# Patient Record
Sex: Male | Born: 1955 | Race: White | Hispanic: No | Marital: Married | Smoking: Former smoker
Health system: Southern US, Community
[De-identification: ages and names within clinical notes are randomized; demographics above are authoritative.]

## PROBLEM LIST (undated history)

## (undated) DIAGNOSIS — C4491 Basal cell carcinoma of skin, unspecified: Secondary | ICD-10-CM

## (undated) DIAGNOSIS — E7800 Pure hypercholesterolemia, unspecified: Secondary | ICD-10-CM

## (undated) DIAGNOSIS — I255 Ischemic cardiomyopathy: Secondary | ICD-10-CM

## (undated) DIAGNOSIS — I2510 Atherosclerotic heart disease of native coronary artery without angina pectoris: Secondary | ICD-10-CM

## (undated) DIAGNOSIS — I10 Essential (primary) hypertension: Secondary | ICD-10-CM

## (undated) HISTORY — PX: APPENDECTOMY: SHX54

---

## 1986-02-23 HISTORY — PX: NASAL SEPTUM SURGERY: SHX37

## 2003-02-24 HISTORY — PX: CORONARY ANGIOPLASTY WITH STENT PLACEMENT: SHX49

## 2003-02-24 HISTORY — PX: OTHER SURGICAL HISTORY: SHX169

## 2003-12-18 ENCOUNTER — Inpatient Hospital Stay (HOSPITAL_COMMUNITY): Admission: EM | Admit: 2003-12-18 | Discharge: 2003-12-28 | Payer: Self-pay | Admitting: Cardiology

## 2003-12-18 ENCOUNTER — Ambulatory Visit: Payer: Self-pay | Admitting: Cardiology

## 2003-12-20 ENCOUNTER — Encounter: Payer: Self-pay | Admitting: Cardiology

## 2004-02-14 ENCOUNTER — Ambulatory Visit: Payer: Self-pay | Admitting: Internal Medicine

## 2004-02-19 ENCOUNTER — Ambulatory Visit: Payer: Self-pay | Admitting: Internal Medicine

## 2004-02-19 ENCOUNTER — Observation Stay (HOSPITAL_COMMUNITY): Admission: RE | Admit: 2004-02-19 | Discharge: 2004-02-20 | Payer: Self-pay | Admitting: Internal Medicine

## 2004-03-05 ENCOUNTER — Ambulatory Visit: Payer: Self-pay | Admitting: Internal Medicine

## 2004-03-24 ENCOUNTER — Ambulatory Visit: Payer: Self-pay | Admitting: Internal Medicine

## 2004-12-30 ENCOUNTER — Ambulatory Visit: Payer: Self-pay | Admitting: Internal Medicine

## 2005-07-13 ENCOUNTER — Ambulatory Visit: Payer: Self-pay | Admitting: Internal Medicine

## 2005-08-09 IMAGING — CT CT ABDOMEN W/O CM
1 series · 15 of 32 positions shown, 19 images · IV contrast (agent unspecified)
Comparison: none

CLINICAL DATA: Chest pain, cardiac cath, abdominal and pelvic pain with history of   cath earlier this week.
TECHNIQUE: Multidetector helical CT scanning obtained through the abdomen and pelvis.
CT ABDOMEN WITHOUT CONTRAST:
The liver, spleen, adrenal glands, pancreas are unremarkable.  Mild perinephric stranding bilaterally may be related to acute or remote inflammation.  A tiny calcification along the gallbladder wall may represent a tiny gallstone.  The remainder of the gallbladder and kidneys are unremarkable.  Please note that parenchymal abnormalities may be missed as intravenous contrast was not administered.  No evidence of enlarged lymph nodes, free fluid, abdominal aortic aneurysm, biliary dilatation, or hematoma.  Visualized bowel is unremarkable.  Moderate umbilical hernia containing fat is noted.

[Series 2: renal stone · axial · 0.70mm/px · z∈[-445,-50]mm · 15 of 88 slices shown, 19 images]
[im 6/88  soft-tissue]
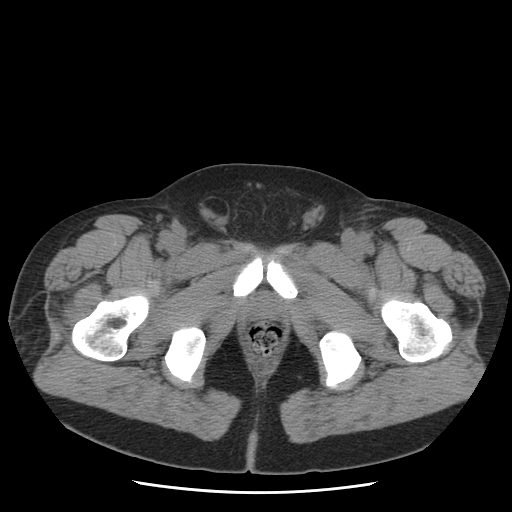
[im 6/88  bone]
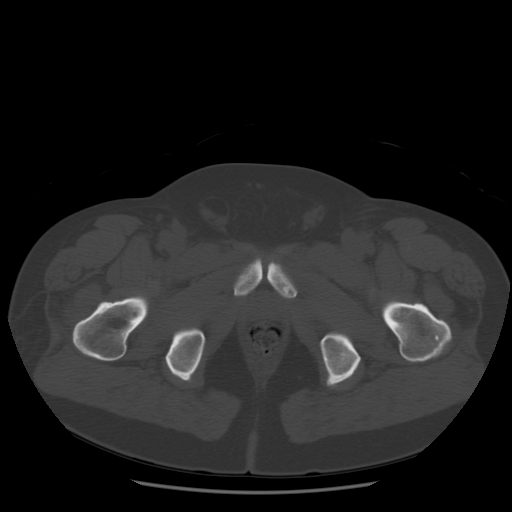
[im 12/88  soft-tissue]
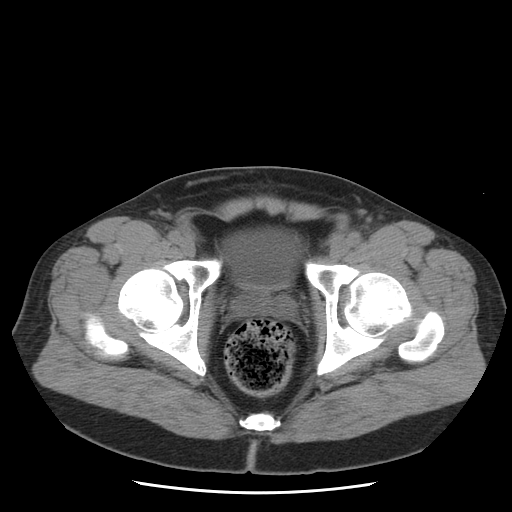
[im 17/88  soft-tissue]
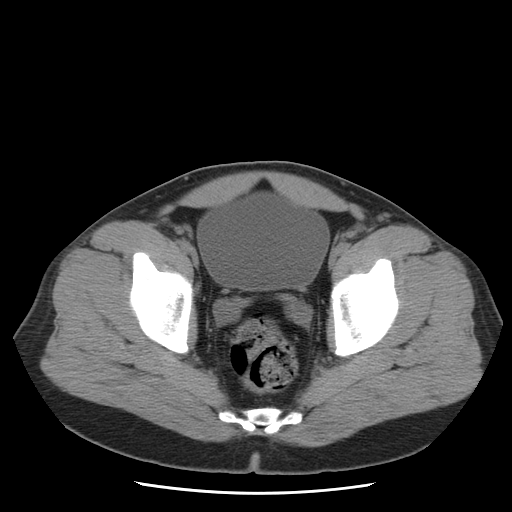
[im 26/88  soft-tissue]
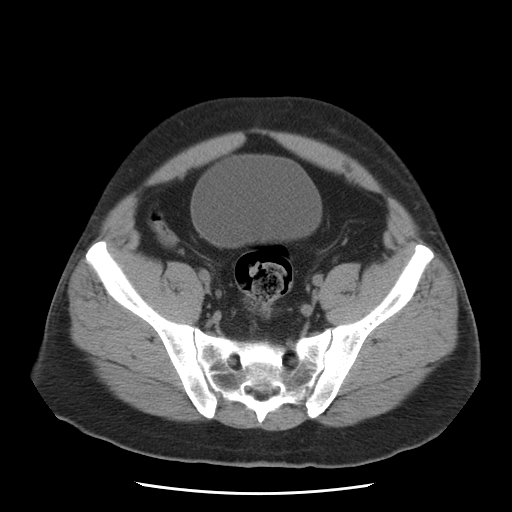
[im 31/88  soft-tissue]
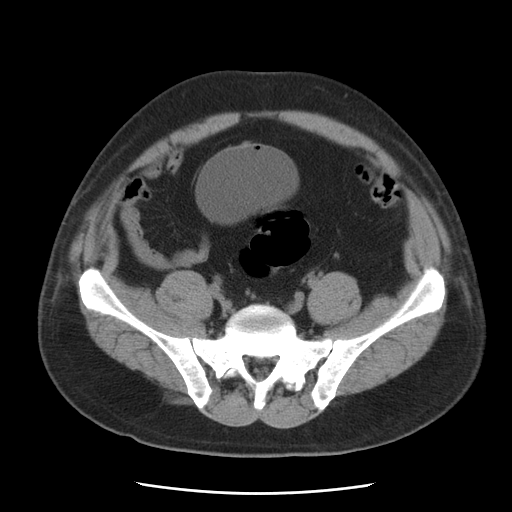
[im 37/88  soft-tissue]
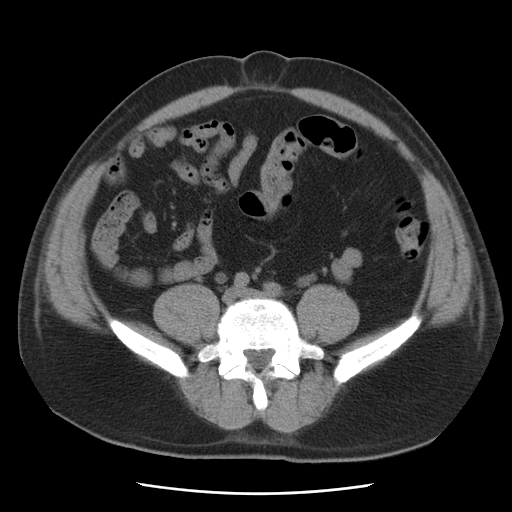
[im 45/88  soft-tissue]
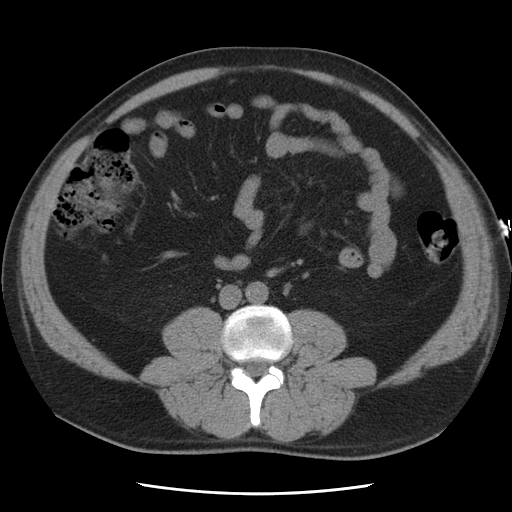
[im 51/88  soft-tissue]
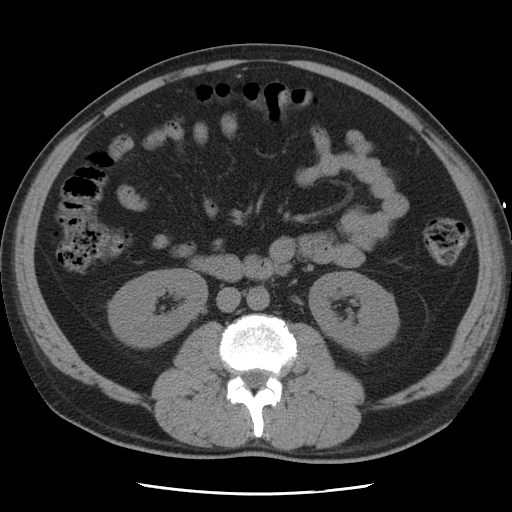
[im 57/88  soft-tissue]
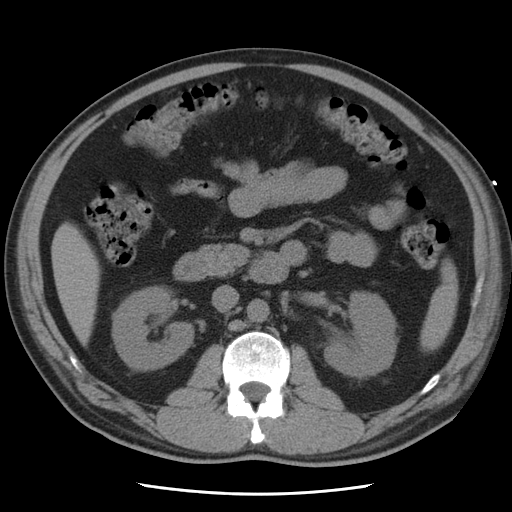
[im 57/88  bone]
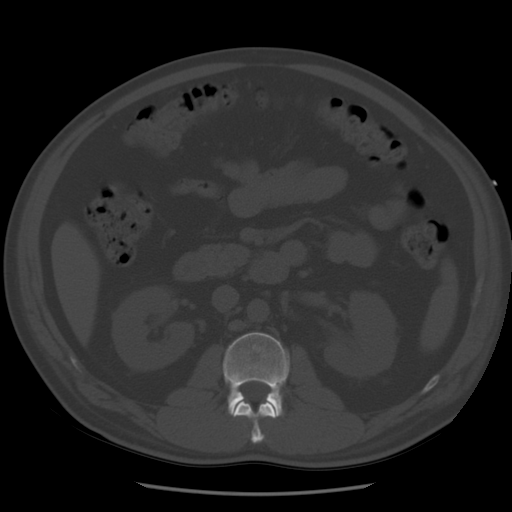
[im 62/88  soft-tissue]
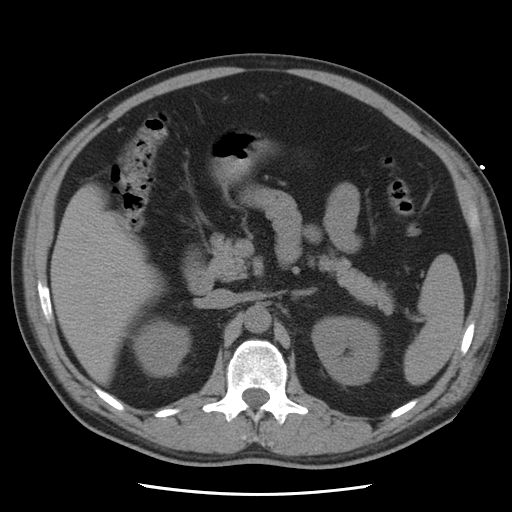
[im 71/88  soft-tissue]
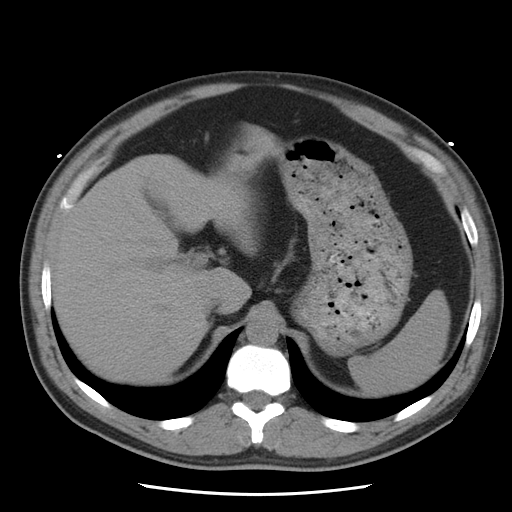
[im 76/88  soft-tissue]
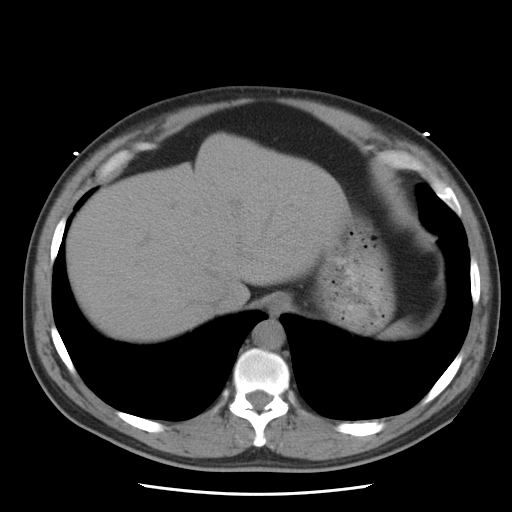
[im 76/88  lung]
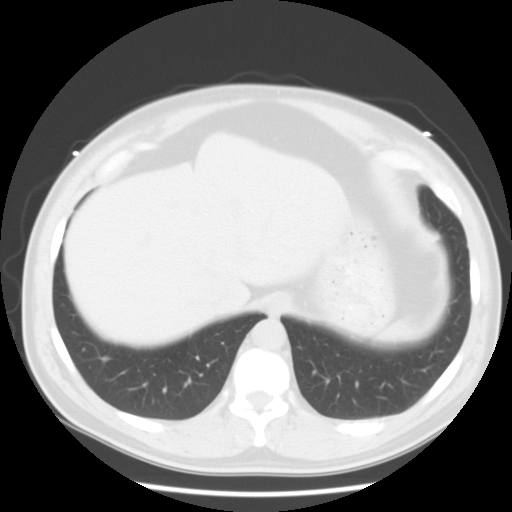
[im 79/88  lung]
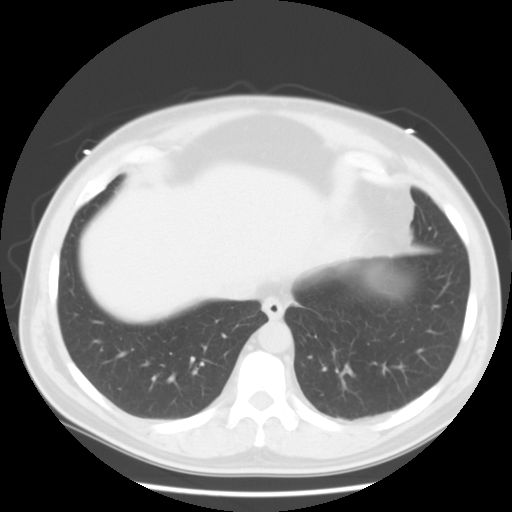
[im 82/88  soft-tissue]
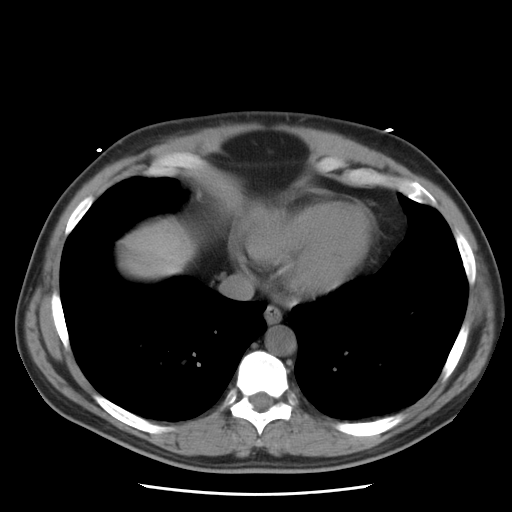
[im 82/88  lung]
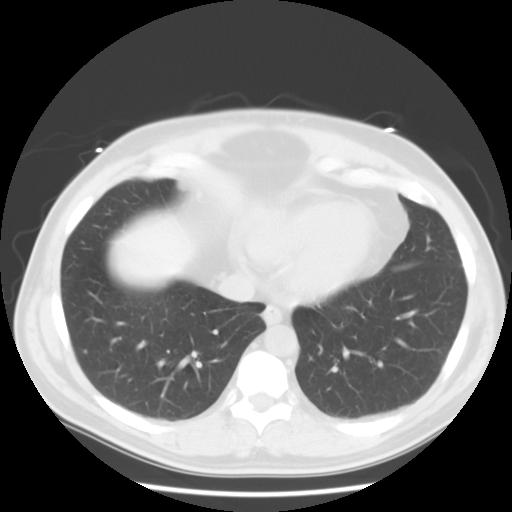
[im 85/88  lung]
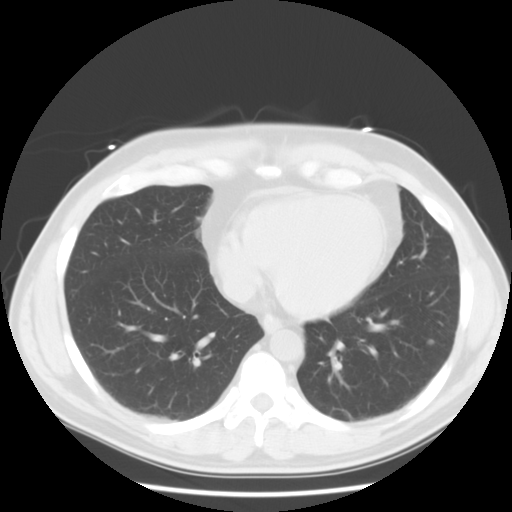

[15 of 32 positions shown; findings below may reference images not displayed]

IMPRESSION: 1.  No acute abnormality.  Specifically, no evidence of abdominal or retroperitoneal hematoma.  
2.  Question tiny gallstone.
3.  Small to moderate umbilical hernia containing fat.
CT PELVIS WITHOUT CONTRAST
Minimal stranding in the right inguinal region is noted.  There may be very small right inguinal hernia containing only fat.  No enlarged lymph nodes, free fluid, or retroperitoneal/pelvic hematoma.  The bladder is distended with tiny amount of focal area of gas, question recent catheterization vs. infection.
IMPRESSION: 1.  No evidence of pelvic, retroperitoneal, or inguinal hematoma.
2.  Mildly distended bladder with tiny focus of gas, question recent catheterization vs. infection.
3.  Question small right inguinal hernia.

## 2005-08-31 ENCOUNTER — Ambulatory Visit: Payer: Self-pay | Admitting: Internal Medicine

## 2006-02-11 ENCOUNTER — Ambulatory Visit: Payer: Self-pay

## 2006-12-02 ENCOUNTER — Ambulatory Visit: Payer: Self-pay | Admitting: Internal Medicine

## 2007-02-28 ENCOUNTER — Ambulatory Visit: Payer: Self-pay | Admitting: Internal Medicine

## 2008-03-20 ENCOUNTER — Ambulatory Visit: Payer: Self-pay | Admitting: Internal Medicine

## 2008-03-20 DIAGNOSIS — I2510 Atherosclerotic heart disease of native coronary artery without angina pectoris: Secondary | ICD-10-CM | POA: Insufficient documentation

## 2008-03-20 DIAGNOSIS — E785 Hyperlipidemia, unspecified: Secondary | ICD-10-CM | POA: Insufficient documentation

## 2008-03-20 DIAGNOSIS — Z95810 Presence of automatic (implantable) cardiac defibrillator: Secondary | ICD-10-CM | POA: Insufficient documentation

## 2008-03-20 HISTORY — DX: Atherosclerotic heart disease of native coronary artery without angina pectoris: I25.10

## 2008-03-20 HISTORY — DX: Presence of automatic (implantable) cardiac defibrillator: Z95.810

## 2008-03-29 ENCOUNTER — Ambulatory Visit: Payer: Self-pay | Admitting: Internal Medicine

## 2008-06-08 ENCOUNTER — Encounter: Payer: Self-pay | Admitting: Internal Medicine

## 2008-07-04 ENCOUNTER — Encounter (INDEPENDENT_AMBULATORY_CARE_PROVIDER_SITE_OTHER): Payer: Self-pay | Admitting: *Deleted

## 2009-01-05 ENCOUNTER — Encounter: Payer: Self-pay | Admitting: Internal Medicine

## 2009-01-07 ENCOUNTER — Ambulatory Visit: Payer: Self-pay | Admitting: Internal Medicine

## 2009-01-16 ENCOUNTER — Encounter: Payer: Self-pay | Admitting: Internal Medicine

## 2010-03-05 ENCOUNTER — Encounter (INDEPENDENT_AMBULATORY_CARE_PROVIDER_SITE_OTHER): Payer: Self-pay | Admitting: *Deleted

## 2010-03-27 NOTE — Letter (Signed)
Summary: Device-Delinquent Check  [facility], Main Office  [address]   [place], [place] [address]   Phone: [phone]  Fax: [phone]     March 05, 2010 MRN: [id number]   CARI VANDEBERG [address] [place], [place]  [address]   Dear Mr. CARNEIRO,  According to our records, you have not had your implanted device checked in the recommended period of time.  We are unable to determine appropriate device function without checking your device on a regular basis.  Please call our office to schedule an appointment, with Dr Graciela Husbands, as soon as possible.  If you are having your device checked by another physician, please call us so that we may update our records.  Thank you,  Letta Moynahan, EMT  March 05, 2010 4:25 PM  [facility]  certified

## 2010-04-04 ENCOUNTER — Encounter: Payer: Self-pay | Admitting: Internal Medicine

## 2010-04-22 NOTE — Cardiovascular Report (Signed)
Summary: Certified Letter Returned - Transferred to [facility]  Certified Letter Returned - Transferred to [facility]   Imported By: Debby Freiberg 04/16/2010 13:13:24  _____________________________________________________________________  External Attachment:    Type:   Image     Comment:   External Document

## 2010-07-08 NOTE — Assessment & Plan Note (Signed)
[facility]                         ELECTROPHYSIOLOGY OFFICE NOTE   NAME:[name] [name] NOVOSEL                   MRN:          [id number]  DATE:03/20/2008                            DOB:          Jun 29, 1955    Mr. [name] [name] is seen today in followup for ischemic cardiomyopathy.  An  ICD implanted for primary prevention.  He has a 6949 lead in place.  He  has no complaints of chest pain or shortness of breath.   His medications include Lipitor 10, aspirin, Coreg, lisinopril, and  Plavix.  He is not taking his Wellbutrin, which is important because it  helped him amazingly with his smoking, which he has not done for 3  years.   His blood pressure is 130/83, his pulse is 78.  His lungs were clear.  Heart sounds were regular.  Extremities were without edema.   IMPRESSION:  1. Ischemic cardiomyopathy.  2. Status post implantable cardioverter-defibrillator for primary      prevention.  3. 6949 lead.   Mr. [name] is stable.  We will see him again in 3 months' time in the  Device Clinic.     [name], MD, [facility]  Electronically Signed    SCK/MedQ  DD: 03/20/2008  DT: 03/21/2008  Job #: [id number]   cc:   Florian Buff, MD

## 2010-07-08 NOTE — Letter (Signed)
February 28, 2007    Florian Buff, MD  [address],  [place], [place]  [address]   RE:  [name], [name]  MRN:  [id number]  /  DOB:  07/13/1955   Dear  Karren Burly:   Happy New Year.  [name] comes in today without complaints of  shortness of breath or chest pain.  His ICD has been quiet.  Medications  include lisinopril 10, Coreg 25 b.i.d., Plavix, aspirin, Lipitor,  Wellbutrin and fish oil.   On examination, his blood pressure was 124/82.  His pulse was 78.  His  lungs were clear.  Heart sounds were regular, and the extremities were  without edema.   Interrogation of his Medtronic Maximo [id number] ICD demonstrated an R-wave of  5.1 with impedance of 496, a threshold of 1 volt of 0.3, the battery  voltage was 3.10.  There were no intercurrent episodes.   His 69/49 lead is stable.   IMPRESSION:  1. Ischemic cardiomyopathy.  2. Status post implantable cardioverter-defibrillator for primary      prevention.  3. 69/49 lead with the LIA software activated.   [name] is stable.  Will see him again in 1 year's time and will have  him transmit via [facility] in interim.    Sincerely,      [name], MD, [facility]  Electronically Signed    SCK/MedQ  DD: 02/28/2007  DT: 02/28/2007  Job #: [id number]

## 2010-07-11 NOTE — Discharge Summary (Signed)
NAMERONNY, KORFF NO.:  [id number]   MEDICAL RECORD NO.:  [id number]          PATIENT TYPE:  INP   LOCATION:  2035                         FACILITY:  [facility]   PHYSICIAN:  Hungerford Bing, M.D.  DATE OF BIRTH:  Sep 25, 1955   DATE OF ADMISSION:  12/18/2003  DATE OF DISCHARGE:  12/28/2003                                 DISCHARGE SUMMARY   CARDIOLOGIST:  Dr. Sherril Croon at [facility]   ELECTROPHYSIOLOGY SPECIALIST:  Dr. Graciela Husbands at [facility]   PRIMARY CARE PHYSICIAN:  Patient is going to be followed in [place] at  [facility].  It is Dr. Lucila Maine at [facility] in [place].   DISCHARGE DIAGNOSES:  Acute anterior myocardial infarction status post  cardiac catheterization.  Status post cardiac arrest and ventricular  tachycardia.  Status post stent to the proximal left anterior descending  artery.   PAST MEDICAL HISTORY:  Patient states he does not go to the doctor unless he  has something bothering him.  He usually is seen at urgent care.  Patient  denies any problems.  Tobacco abuse would be only noted medical problem.   HISTORY OF PRESENT ILLNESS:  This is a 55 year old Caucasian male who began  having substernal chest pain around 10 a.m. on the day of admission.  He  called EMS.  EMS found patient to be having an acute anterior myocardial  infarction.  He was transported to [facility] where he developed  ventricular fibrillation.  He was defibrillated a total of three times  before arriving at [facility] from [place].  He was placed on  intravenous amiodarone and heparin drip.  Dopamine was stopped.  He was  started on intravenous nitroglycerin.  EKG at that time revealed an acute  anterior myocardial infarction.  Blood pressure 117/36.  Patient was taken  emergently to the catheterization laboratory by Dr. Dorethea Clan for a left heart  catheterization.  Impression:  Severe two vessel coronary artery disease in  left anterior  descending and right coronary artery with the left anterior  descending being the culprit lesion.  Acute ST segment elevation myocardial  infarction in anterior wall.  Acutely depressed left ventricular systolic  function.  Cardiogenic shock, ventricular fibrillation arrest, and mitral  regurgitation.  On the same day Dr. Charlies Constable also placed a Swan-Ganz  catheter in patient due to the severity of his LV function.  The Swan-Ganz  catheter was placed via the right femoral vein.  Initially, the LAD was  totally occluded.  Resulting catheterization was successful PTCA and  stenting of the lesion in the proximal LAD using a drug-eluting stent with  improvement in narrowing from 100 to 0% improving the flow from TIMI 0 to  TIMI 2 flow.  Patient's outlook was very guarded at that time due to the  very large infarct complicated by congestive heart failure and ventricular  fibrillation with uncertain neurological status.  Also, he only obtained  TIMI 2 flow which made the outlook for recovery of LV function less  optimistic.  Patient to CCU post catheterization.  Blood pressure 140/80,  pulse 100,  99% on 3 L.  Continued amiodarone IV.  Status post extubation on  the 26th.  On the 27th 2-D echocardiogram showing an ejection fraction of 35-  45% with akinesis of the anteroseptal wall and akinesis of the periapical  wall.  No significant mitral valve regurgitation.  Trivial tricuspid valve  regurgitation.  On the 27th patient in sinus rhythm.  Lungs clear.  Blood  pressure 112/62.  Telemetry showing sinus rhythm with a rate of 96.  BUN 5,  creatinine 0.8, potassium 3.8.  Lipid panel shows a total cholesterol of  218, triglycerides 135, HDL 32, LDL 159.  Chest x-ray on the 26th, the day  prior to this note, shows chest x-ray with cardiomegaly, no acute  abnormalities.  Patient stabilized.  Plan was to proceed with a PCI by Dr.  Juanda Chance, as stated above, on the 28th.  28th post intervention patient   stable.  Blood pressure 100-110 systolic.  Adjustments made in medication.  Dr. Samule Ohm in to see patient on the 29th.  Patient without complaints.  BUN  8, creatinine 1.1, hematocrit 40.  Cardiac rehabilitation in to work with  patient.  Smoking cessation discussed.  Heparin continued.  On the 30th Dr.  Corinda Gubler, cardiology, in to see patient again.  Blood pressure 100/62, heart  rate 60 and regular, hemoglobin 13.6, BUN 8, creatinine 1.0, potassium 3.5.  Patient still on IV heparin.  Continue to monitor.  Captopril discontinued.  Lisinopril 20 mg p.o. daily ordered.  Coreg also initiated.  Coumadin  initiated per pharmacy.  Patient transferred to telemetry.  Patient without  complaints of discomfort.  Dr. Daleen Squibb in to see patient on the 31st.  Vital  signs stable.  Coreg increased to 18.75 mg p.o. b.i.d.  Continue heparin for  now.  EP consult placed.  Pharmacy in to regulate Coumadin levels and  continue heparin for now.  Dr. Graciela Husbands in to see patient for EP consult.  Noted that patient's medical history treatment thus far cardiac function  continues to improve; however, patient qualifies for AID but patient's  outcomes will not be affected by immediate ICD implantation after  revascularization.  Recommendation is life vest x30 days and then reevaluate  EF.  Proper paperwork initiated for life vest placement.  Dr. Daleen Squibb in to see  patient on the 1st again.  Pending discharge.  Coreg increased to 25 mg p.o.  b.i.d.  Continue cardiac rehabilitation.  Patient fitted for a life vest.  Continue checking INR.  On the 3rd blood pressure 92/67, saturation 98% on  room air, hemoglobin 13.4, PT 24.8, creatinine 3.1.  Telemetry showing sinus  rhythms 80s-90s.  Patient without complaints initially, however, complained  later of discomfort in testicular area radiating to flank area.  Ultrasound  of the bilateral groins done to rule out pseudoaneurysm.  Impression: Negative for pseudoaneurysm, normal Doppler  wave forms throughout.  Patient  continued to complain of pain.  Sent for CT without contrast to rule out  retroperitoneal bleed.  Baseline CT report on the evening of the 3rd  negative for retroperitoneal bleed.  Patient awaiting life vest for  discharge.  On the 4th patient afebrile, heart rate 84 and regular, blood  pressure 98/76, 98% on room air, hemoglobin 13.4, PT 21.5 with an INR 2.4.  Patient alert, in no acute distress.  Did complain of some groin pain.  CT  of the abdomen was negative for retroperitoneal bleed, however, questionable  small right inguinal hernia repair.  Dr. Dietrich Pates  in to see patient.  Examined patient.  Recommended urology see patient prior to discharge.  Patient fitted with life vest.  Plan to discharge home after urology  evaluates.  However, patient is adamant he does not want to wait here to see  urologist.  He states he will follow up with his primary care physician, Dr.  Lorin Picket, for referral to urology.  I stressed the importance patient needed to  have this checked.  If he continues to have discomfort it could lead to  permanent complications.  He and his wife both agree that they will follow  up with this with Dr. Lorin Picket.  The plan is discharge patient home.  He will  follow up with Dr. Sherril Croon in [facility] for Coumadin therapy.  He  will be returning to our office for echocardiogram to reevaluate LV function  and also to be reevaluated by Dr. Graciela Husbands for ICD placement within the next  two months.   DISPOSITION:  Home with prescriptions for the following medications:  Plavix  75 mg daily, Lipitor 80 mg, nitroglycerin 0.4 mg p.r.n., Wellbutrin SR 150  mg one b.i.d., Aldactone 25 mg daily, Prinivil 20 mg daily, Coreg 25 mg  b.i.d., Coumadin 5 mg daily until seen by [facility], then take as  directed.  He is also instructed to take a coated aspirin 325 mg daily.  Use  Tylenol for general discomfort.  He is instructed to avoid driving for five   days.  He cannot return to work until cleared by Dr. Graciela Husbands.  He also is in  the process of filling out disability or short-term disability paperwork.  He is to follow a low fat diet.  He is to call our office for any problems  from his catheterization site.  He has a follow-up appointment December 12  at 9:30 for cardiac echocardiogram at [facility] office.  He has  a follow-up appointment with Dr. Sherryl Manges December 20 at 10 a.m. for  consideration ICD.  In the meantime, patient will continue wearing his life  vest.  He has an appointment Monday, November 7 at [facility] for  Coumadin level to be drawn and he will also need an appointment in one to  two weeks with Dr. Sherril Croon for post catheterization check.  Patient states he  will have this done when he has a Coumadin level checked on the second week  of discharge.  Also discussed with patient at length the importance of not smoking and to continue using Wellbutrin or whatever measurements he  needs to take to discontinue tobacco use considering his cardiac status now.  I have also faxed a copy of patient's PT and INR records to [facility] dating from October 29 through today's date, November 4 for  regulation of his Coumadin.      [place]   MB/MEDQ  D:  12/28/2003  T:  12/29/2003  Job:  [id number]   cc:   [name], M.D.   Lucila Maine, M.D.   Sherril Croon, M.D.  [facility]

## 2010-07-11 NOTE — Cardiovascular Report (Signed)
[name], [name] NO.:  [id number]   MEDICAL RECORD NO.:  [id number]          PATIENT TYPE:  OUT   LOCATION:  CATH                         FACILITY:  [facility]   PHYSICIAN:  Vida Roller, M.D.   DATE OF BIRTH:  22-Jun-1955   DATE OF PROCEDURE:  12/18/2003  DATE OF DISCHARGE:                              CARDIAC CATHETERIZATION   Primary is unknown.  Cardiologist is Dr. Sherril Croon.   HISTORY OF PRESENT ILLNESS:  [name] is a 55 year old man with an unknown  past medical history who presented to [facility] ER after describing chest  discomfort, called 9-1-1, presented to the hospital, had a ventricular  fibrillation arrest, was defibrillated out of that, and found to have an ST  segment elevation anterior wall myocardial infarction, and he was  transported emergently to the catheterization lab at [facility].  His past  medical history and past surgical history are unknown.  His medications are  unknown.   PROCEDURES PERFORMED:  1.  Left heart catheterization.  2.  Left ventriculography.  3.  Coronary angiography.   DETAILS OF THE PROCEDURE:  The patient was brought emergently to the cardiac  catheterization lab.  There he was prepped and draped in the usual sterile  manner.  Local anesthetic was obtained over the right groin using 1%  lidocaine, and the patient was intubated at the time and sedated and  paralyzed.  The right femoral artery was cannulated using the modified  Seldinger technique with a 6 [place], 10 cm sheath, and left heart  catheterization was performed using a 6 French Judkins left #4, 6 French  Judkins right #4, and a 6 French pigtail catheter.  The pigtail catheter was  used for left ventriculography, which was imaged in the RAO 30 degree view  with a power injector.  At the conclusion of the procedure the sheath was  left in place and the patient went for emergent percutaneous  revascularization of his left anterior descending coronary  artery.   RESULTS:  1.  Aortic pressure 120/98 with a mean arterial pressure of 110.  2.  Left ventricular pressure 106/19 with an end-diastolic pressure of 23      mmHg.   CORONARY ANGIOGRAPHY:  1.  The right coronary artery is a large, dominant vessel, which has a 75%      lesion with a ruptured plaque in its midportion.  The posterior      descending coronary artery and the posterolateral branch are both large      arteries.  2.  The left main coronary artery is a large vessel, which has luminal      irregularities.  3.  The left circumflex coronary artery is a small vessel which has a single      obtuse marginal, which has an aneurysmal area in it but no significant      obstruction.  4.  The left anterior descending coronary artery is occluded at its ostium,      and there is a ramus intermedius or a high diagonal branch, which is      seen to fill and has  only luminal irregularities.   The left ventriculogram reveals an ejection fraction of 25% with anterior,  anterior apical, apical, inferior apical, and inferior akinesis, with 2+  mitral regurgitation.   ASSESSMENT:  1.  Severe two-vessel coronary artery disease in the left anterior      descending and right coronary artery with the left anterior descending      being the culprit lesion.  2.  Acute ST segment elevation myocardial infarction in the anterior wall.  3.  Acutely depressed left ventricular systolic function.  4.  Cardiogenic shock.  5.  Ventricular fibrillation arrest.  6.  Mitral regurgitation.   PLAN:  Emergent percutaneous revascularization of the left anterior  descending coronary artery and consideration for staged revascularization of  the right coronary artery after the patient stabilizes.      Trey Paula   JH/MEDQ  D:  12/18/2003  T:  12/18/2003  Job:  [id number]

## 2010-07-11 NOTE — Cardiovascular Report (Signed)
[name], [name] NO.:  [id number]   MEDICAL RECORD NO.:  [id number]          PATIENT TYPE:  INP   LOCATION:  2922                         FACILITY:  [facility]   PHYSICIAN:  Charlies Constable, M.D. [facility] DATE OF BIRTH:  10-18-1955   DATE OF PROCEDURE:  DATE OF DISCHARGE:                              CARDIAC CATHETERIZATION   DATE OF PROCEDURE:  December 18, 2003.   CLINICAL HISTORY:  [name] is 55 years old and has no prior history of  known heart disease.  He developed chest pain and came to [facility]  by ambulance and fibrillated right before he arrived at the hospital.  His  EKG showed an acute anterior wall infarction, and he fibrillated twice and  required intubation and was brought to [place] by ambulance intubated, sedated,  and paralyzed.  The diagnostic study was performed by Dr. Dorethea Clan and showed  a total occlusion of the LAD right near the ostium.  There also was what  appeared to be a ruptured plaque in the mid right coronary artery that was  about 80% narrowed.  The left ventriculogram showed a large area of  anterolateral and apical akinesis with an estimated ejection fraction of  about 30%.   SURGEON:  Charlies Constable, MD, [facility].   PROCEDURE:  The procedure was performed via the right femoral artery using  arterial sheath and a JL-3.5 guiding catheter.  We crossed the lesion in the  proximal LAD with an [employer] wire with a moderate amount of  difficulty.  We were unable to establish reperfusion with the wire or with  dottering the lesion.  We next went in with an Export catheter, but once  again were unable to obtain any thrombus and were not able to establish  reperfusion.  We then dilated up and down the vessel with a 2.25 x 20-mm  Maverick and following serial dilatations, we were able to establish  reperfusion.  We identified the primary lesion in the proximal LAD at a  diagonal branch.  We then stented the lesion with a 2.5 x 24 mm Taxus  stent  and post-dilated with a 2.75 x 20 mm Quantum Maverick balloon.  We dilated  the proximal portion of the stent with a 3.25 x 8 Quantum Maverick balloon.  Following stenting, the patient developed TIMI-2 flow with a pulsatile flow.  We gave repeated doses of intracoronary Verapamil, which improved the flow  slightly, but he was left with TIMI-2 flow at the end of the procedure.   Because of the severity of his LV function, we decided to place a Swan-Ganz  catheter, which was placed via the right femoral vein with results described  below.   The patient remained stable through the procedure, although he was  critically ill, intubated, and paralyzed.  He left the laboratory in what  appeared to be a low output state.  Although his blood pressure was  maintained on low dose dopamine, he was not in frank cardiogenic shock.  The  dopamine was started for cardiac output rather than for hypotension.   RESULTS:  Initially, the LAD  was totally occluded near its origin.  Following stenting, the stenosis improved from 100% to 0%, and the flow  improved with TIMI-0 to TIMI-2 flow.  We stented across the diagonal branch  and this was not significantly compromised.   The Swan-Ganz catheter reading showed a right atrial pressure of 20,  a  pulmonary artery pressure of 42/32/37, a pulmonary wedge pressure of 33.  Left ventricular pressure was 106/23, and the aortic pressure of 106/98 with  a mean of 110.   The patient had the onset of chest pain at 10 a.m. and arrived at [facility] at 10:38, and the first balloon inflation here was at 1:07.  This  gave a door-to-balloon time of 2 hours and 29 minutes, and a reperfusion  time of 3 hours and 7 minutes.   CONCLUSION:  1.  Acute anterior wall myocardial infarction complicated by recurrent      ventricular fibrillation requiring DC cardioversion and intubation, with      total occlusion of the LAD, no major obstruction in the circumflex       artery, 80% narrowing in the mid right coronary artery, and a large area      of anterolateral wall and apical wall akinesis, and estimated ejection      fraction of 30% as documented by Dr. Dorethea Clan.  2.  Successful PTCA and stenting of the lesion in the proximal LAD using a      drug-eluding stent with improvement in sentinel narrowing from 100% to      0%, improving their flow from TIMI-0 to TIMI-2 flow.   DISPOSITION:  The patient outlook must be very guarded due to the very large  infarct complicated by congestive heart failure and ventricular fibrillation  with uncertain neurological status.  Also, he only obtained TIMI-2 flow,  which makes the outlook for recovery of LV function less optimistic.       BB/MEDQ  D:  12/18/2003  T:  12/18/2003  Job:  [id number]   cc:   Hazle Nordmann ??, Dr.   Vida Roller, M.D.  Fax: [phone]

## 2010-07-11 NOTE — Op Note (Signed)
[name], [name] NO.:  [id number]   MEDICAL RECORD NO.:  [id number]          PATIENT TYPE:  INP   LOCATION:  6525                         FACILITY:  [facility]   PHYSICIAN:  [name], M.D.  DATE OF BIRTH:  [birth year]   DATE OF PROCEDURE:  DATE OF DISCHARGE:                                 OPERATIVE REPORT   DATE OF OPERATION:  February 19, 2004.   PREOPERATIVE DIAGNOSES:  Ischemic heart disease with prior anterior wall  myocardial infarction and depressed left ventricular function persisting  post myocardial infarction.   POSTOPERATIVE DIAGNOSES:  Ischemic heart disease with prior anterior wall  myocardial infarction and depressed left ventricular function persisting  post myocardial infarction.   PROCEDURE:  Single __________defibrillator implantation with intraoperative  defibrillation threshold testing.   DESCRIPTION OF PROCEDURE:  Following obtaining informed consent, the patient  was brought to the Electrophysiological Laboratory and placed on the  fluoroscopic table in the supine position.  After routine prep and drape of  the left upper chest, Lidocaine was infiltrated in the subclavicular region  where an incision was made and carried down to the layer of the prepectoral  fascia using electrocautery and sharp dissection.  A pocket was formed  similarly, hemostasis was obtained.   Thereafter, attention was turned to gaining access to the extrathoracic left  subclavian vein, which was accomplished with mild difficulty, but without  the aspiration of air or puncture of the artery.  A single venipuncture was  accomplished with the help of a contrast venogram.  A guidewire was placed  and retained, and a 7-French sheath was placed through which was then passed  a Medtronic 5949, 65 cm dual-coil active fixation defibrillator lead, serial  #[id number].  Under fluoroscopic guidance, it was manipulated to the right  ventricular apex where it was  mapped in various positions.  Unfortunately,  we would have some R-waves and some problems with pacing capture, and  ultimately the location was identified where the bipolar P-wave at 6.7 mV  with a pacing impedance of 974 ohms, with a threshold of 0.6V at 0.5 msec.  Currented threshold was 1.0 MA, and there was no diaphragmatic pacing at  10V.   The lead was then secured to the prepectoral fascia and then attached to a  Medtronic Maximo VR-7232, CX-ICD, serial #[id number].  Through the device, a  bipolar R-wave was 4.1 mV with a pacing impedance of 760 ohms and a  threshold of 1V at 0.8 msec.  High voltage impedance was 62 ohms, proximal  coil impedance was normal, I think it was 50 ohms.  At this point,  defibrillation threshold testing was undertaken.  Ventricular fibrillation  was induced via the T-wave shock.  After a total duration of 5.5 seconds, a  15-joule shock was delivered through a measured resistance of 47 ohms,  terminating ventricular fibrillation and restoring a sinus rhythm. At this  point, however, we had some problems with the equipment.  The oxygen  saturation monitoring was reading 45 even though the patient did not appear  to be desaturating.  Blood pressure was a little bit  low in the 95 range.  Fluoroscopy demonstrated good movement of the pericardial shadow.  Because  of the adequate DFT, it was elected not to shock him again because of the  concerns about the oxygen saturation monitoring.  The pocket was then  copiously irrigated with antibiotic-containing saline solution.  Hemostasis  was assured.  The lead and the pulse generator were placed in the  pocket and secured to the prepectoral fascia.  The wound was washed, dried,  and a Benzoin/Steri-Strip dressing was applied.  Needle counts, sponge  counts, and instruments counts were correct at the end of the procedure,  according to the staff.  The patient tolerated the procedure without  apparent complication  apart from the aforementioned.       SCK/MEDQ  D:  02/19/2004  T:  02/19/2004  Job:  [id number]   cc:   Electrophysiology Laboratory   Florian Buff, Dr.   Lucila Maine, Dr.

## 2010-07-11 NOTE — Cardiovascular Report (Signed)
NAMEANDRAE, CLAUNCH NO.:  [id number]   MEDICAL RECORD NO.:  [id number]          PATIENT TYPE:  INP   LOCATION:  3315                         FACILITY:  [facility]   PHYSICIAN:  Charlies Constable, M.D. [facility] DATE OF BIRTH:  Oct 08, 1955   DATE OF PROCEDURE:  12/21/2003  DATE OF DISCHARGE:                              CARDIAC CATHETERIZATION   CLINICAL HISTORY:  Mr. Yehle is 55 years old and was hospitalized on  December 18, 2003 with an acute anterior wall myocardial infarction.  He was  defibrillated in the EMS just before arriving at [facility] and  defibrillated twice after that, and was intubated on arrival here.  We  opened an LAD and got a nice anatomical result, but he had only TIMI-2 flow  distally and he had a very large area of anterolateral and apical wall  akinesis, and we estimated his ejection fraction to be 25%.  He had residual  disease in the right coronary artery that appeared to be a ruptured plaque  and we brought him back today for intervention on the right coronary artery.   PROCEDURE:  The procedure was performed via the left femoral artery using  arterial sheath and 6-French preformed coronary catheters.  A femoral  arterial puncture was performed and Omnipaque contrast was used.  We took  pictures of the left coronary first to assess the LAD stent and then we  performed a left ventriculogram to assess the LV function.  We then  evaluated the right coronary artery.  After taking a picture of the right  coronary artery, we felt the lesion had improved significantly and we felt  it no longer required intervention.  The patient tolerated the procedure  well and left the laboratory in satisfactory condition.   RESULTS:  Left main coronary artery:  The left main coronary artery is free  of significant disease.   Left anterior descending artery:  The left anterior descending artery gave  rise to 3 septal perforators and a diagonal branch.  The stent  in the  proximal LAD was widely patent with less than 10% narrowing.  There was TIMI-  2 to -3 flow distally which was better than at the end of the procedure.   Circumflex artery:  The circumflex artery gave rise to a marginal branch and  an atrial branch, and 3 posterolateral branches.  These vessels were free of  significant disease.   Right coronary artery:  The right coronary artery is a large dominant vessel  that was ectatic with multiple irregularities.  It gave rise to 2 right  ventricular branches, a posterior descending and 2 posterolateral branches.  There was 30% narrowing in the proximal vessel.  There was 70% narrowing in  the mid-to-distal vessel.  On the previous study, this appeared to have a  filling defect and thrombus, and appeared to be either a ruptured plaque or  a ruptured plaque with thrombus.  The filling defect had completely cleared  out and the lesion looked smooth and about 70% narrowed.   LEFT VENTRICULOGRAM:  The left ventriculogram performed in the RAO  projection showed  akinesia of the anterolateral wall and apex.  The inferior  wall moved well and the anterior wall near the base moved well.  The  estimated ejection fraction was 30% to 35% which was better than at the time  of the acute study.   CONCLUSION:  1.  Coronary artery disease, status post anterior wall myocardial infarction      on December 18, 2003 complicated by ventricular fibrillation arrest,      treated with stenting of the proximal left anterior descending.  2.  Less than 10% narrowing at the stent site in the proximal left anterior      descending with TIMI-2 to -3 flow distally (better), no significant      obstruction of the circumflex artery, 30% narrowing in the proximal and      70% narrowing in the mid-to-distal right coronary artery, and      anterolateral and apical wall akinesis with an estimated ejection      fraction of 30% to 35% (better).   RECOMMENDATIONS:  The lesion  in the right coronary artery has cleaned up  fairly dramatically since the acute study.  It now no longer appears to be  flow-limiting.  In view of the fact that the patient has a recent large  infarct and the uncertainty whether this lesion is significant, I think the  best course is medical therapy.  I reviewed these with Dr. Emilie Rutter. Pulsipher  and he agrees.  We will recommend that Dr. Sherril Croon evaluate him with a  Cardiolite scan after he has recovered from his infarct.       BB/MEDQ  D:  12/21/2003  T:  12/21/2003  Job:  [id number]   cc:   Florian Buff M.D.   [facility] Cardiopulmonary Lab

## 2010-07-11 NOTE — H&P (Signed)
NAMEMOHANAD, CARSTEN NO.:  [id number]   MEDICAL RECORD NO.:  [id number]          PATIENT TYPE:  OUT   LOCATION:  CATH                         FACILITY:  [facility]   PHYSICIAN:  Vida Roller, M.D.   DATE OF BIRTH:  31-Oct-1955   DATE OF ADMISSION:  12/18/2003  DATE OF DISCHARGE:                                HISTORY & PHYSICAL   CHIEF COMPLAINT:  Chest pain, acute anterior myocardial infarction.   HISTORY OF PRESENT ILLNESS:  Mr. Boruff is a 55 year old male patient who  began having substernal chest pain around 10 A.M. by best estimates.  He  then called EMS within a few minutes according to reports and he was found  to be having an acute anterior myocardial infarction.  He was transported by  EMS to [facility] where upon arrival he developed ventricular  fibrillation.  He was defibrillated a total of three times and one of these  defibrillations occurred en route to [facility].  He was  placed on intravenous amiodarone, intravenous heparin.  He was on  intravenous dopamine, however, this was stopped.  Upon arrival to [facility]  he was started on intravenous nitroglycerin.   ALLERGIES:  Unknown.   MEDICATIONS:  Unknown.   PAST MEDICAL HISTORY:  Unknown.   SOCIAL HISTORY:  We do know that he is married and lives in [place],  otherwise we do not know any more of his social history, family history or  review of systems secondary to the fact that he is intubated and sedated.   PHYSICAL EXAMINATION:  VITAL SIGNS:  Blood pressure 117/36, respirations 28,  pulse 100, oxygen saturation 100% on ventilator.  He is sedated on the  ventilator.  He has been given Pavulon.  HEENT:  Normocephalic, atraumatic.  NECK:  No carotid bruits.  No subclavian bruising, jugular venous  distention.  CHEST:  Clear to auscultation bilaterally.  HEART:  Regular rate and rhythm with no rubs, murmurs or ectopy.  ABDOMEN:  Good bowel sounds, nontender,  nondistended, no masses or  hepatosplenomegaly.  EXTREMITIES:  No peripheral edema.  Palpable lower extremity pulses.  He  does have tattoos on his shoulders.   CLINICAL DATA:  Chest x-ray is pending.  Electrocardiogram reveals an acute  anterior myocardial infarction.  CBC is normal.  His BMET is normal with the  exception of glucose of 193 (status post arrest).  Initial cardiac enzymes  were negative.  His urine drug screen was negative per Dr. Sherril Croon.   ASSESSMENT/PLAN:  1.  Acute anterior myocardial infarction.  2.  Ventricular fibrillation arrest secondary to #1.  3.  Ventilatory dependent respiratory syndrome/cardiogenic shock.   At this point the patient is being taken to the cardiac catheterization  laboratory emergently and we will ask critical care to manage his  ventilator.      Larita Fife   LB/MEDQ  D:  12/18/2003  T:  12/18/2003  Job:  [id number]   cc:   Dr. Hazle Nordmann

## 2010-07-11 NOTE — Discharge Summary (Signed)
[name] NO.:  [id number]   MEDICAL RECORD NO.:  [id number]          PATIENT TYPE:  INP   LOCATION:  6525                         FACILITY[facility]  [facility]   PHYSICIAN:  [name], M.D.  DATE OF BIRTH:  1955-09-01   DATE OF ADMISSION:  02/19/2004  DATE OF DISCHARGE:                                 DISCHARGE SUMMARY   DISCHARGE DIAGNOSIS:  Postprocedure day #1 status post implant of  cardioverter defibrillator, Medtronic MAXIMO VR [phone] Cx with defibrillator  threshold study less than or equal to 15 joules.   SECONDARY DIAGNOSES:  1.  History of myocardial infarction.  2.  Ischemic cardiomyopathy with ejection fraction of 30-35%.  3.  Patient wearing LifeVest for primary prevention.  4.  Coronary artery disease, status post anterior wall myocardial infarction      on December 18, 2003.  5.  Ventricular fibrillation arrest at catheterization October 2005.  6.  Status post stent of proximal left anterior descending October 2005.  7.  Status post repeat intervention December 21, 2003, for ruptured plaque in      the right coronary artery which had cleared significantly such that PCI      was no longer contemplated.   PROCEDURE:  On February 19, 2004, implantation of Medtronic [facility]  cardioverter defibrillator with successful defibrillation threshold test by  Dr. Sherryl Manges.   DISCHARGE DISPOSITION:  The patient is ready for discharge postprocedure day  #1, having no complaints.  The incision has minimal ecchymosis.  No  hematoma.  R waves through the device are 3.5 volts, and on taper, they are  5 volts.  The patient is not complaining of any discomfort at the incision  site.  Mobility of the left upper extremity has been described to the  patient.  He goes home with no medication changes and restarts Coumadin.   DISCHARGE MEDICATIONS:  1.  Lipitor 80 mg daily at bedtime.  2.  Spironolactone 25 mg daily.  3.  Plavix 75 mg daily.  4.  Lisinopril 10  mg daily.  5.  Coreg 25 mg twice daily.  6.  Bupropion 150 mg daily.  7.  Niaspan 500 mg daily.  8.  Aspirin 81 mg daily.  9.  Coumadin as before this admission.  10. For pain management, Tylenol 325 mg 1-2 tablets q.4-6h. as needed.   ACTIVITY:  Activity sheet has been described to the patient.   DISCHARGE DIET:  Low sodium, low cholesterol diet.   DISCHARGE INSTRUCTIONS:  He is instructed to keep his incision dry for the  next week, sponge bathe until February 26, 2004.  He is also told to abstain  from driving for one week and may begin driving on Tuesday, February 26, 2004.   FOLLOWUP:  He has follow-up appointments at the [facility], [facility], [address] on Wednesday, March 05, 2004, at 9 a.m.  He will see Dr. Graciela Husbands in three months for ICD interrogation and followup on  June 05, 2004, at 11:30 a.m.   BRIEF HISTORY:  [name] is a gentleman who had  a myocardial  infarction in October 2005.  He had ventricular fibrillation arrest in the  setting of this myocardial infarction.  He was taken urgently to the  catheterization lab from [facility] here to [facility] and had a  stent placed in the proximal LAD.  There was residual disease in the right  coronary artery which occasioned relook on December 21, 2003, but this  ruptured plaque had cleared dramatically, and no intervention was planned.  The patient was discharged from that hospitalization wearing a LifeVest for  primary protection and has had a follow-up 2D echocardiogram 90 days after  his myocardial infarction which showed continued depressed ejection fraction  of 30-35%.  He presents with anteroapical and septal akinesis.  Functionally, he is doing quite well, but will present on February 19, 2004,  for implant of ICD.   HOSPITAL COURSE:  The patient presented electively on February 19, 2004.  A  Medtronic MAXIMO ICD was implanted without difficulty.  The patient has had  no  postprocedure complications and goes home postprocedure day #1 with  medications and followup as dictated.       GM/MEDQ  D:  02/20/2004  T:  02/20/2004  Job:  [id number]   cc:   Florian Buff, M.D.  Post Cardiology   Lucila Maine, M.D.

## 2010-07-11 NOTE — Discharge Summary (Signed)
NAMERYLEI, CODISPOTI            ACCOUNT NO.:  [id number]   MEDICAL RECORD NO.:  [id number]          PATIENT TYPE:  INP   LOCATION:  6525                         FAC[facility]  [facility]   PHYSICIAN:  Nasteho Glantz Dictator       DATE OF BIRTH:  Jan 25, 1956   DATE OF ADMISSION:  02/19/2004  DATE OF DISCHARGE:                                 DISCHARGE SUMMARY   ADDENDUM:  No dictation.  Just added CC.       GM/MEDQ  D:  02/20/2004  T:  02/20/2004  Job:  [id number]   cc:   Florian Buff, M.D.  [facility] Cardiology

## 2011-04-28 ENCOUNTER — Telehealth: Payer: Self-pay | Admitting: Internal Medicine

## 2011-04-28 NOTE — Telephone Encounter (Signed)
New Msg: Pt wife calling wanting to know if Dr. Graciela Husbands will consider preforming procedure to change out pt leads. Pt is currently under the supervision of a Cardiologists in [place] and they recommended pt contact a MD in [place] to perform the procedure however pt feels more comfortable if Dr. Graciela Husbands performs procedure. Pt last saw Dr. Graciela Husbands in 2005. Please return pt wife call to discuss further.

## 2011-04-28 NOTE — Telephone Encounter (Signed)
I spoke with the patient. He states he has been followed by [facility]. He has recently had a beeping in his chest to start. He says he was seen in the office today and was told by a medtronic rep that he has problem with a lead on his ICD. He states that Dr. Graciela Husbands implanted his device, but he doesn't trust the doctors he's seeing to do a procedure on his device. I explained I will need to review the schedule with Dr. Graciela Husbands and call him back on Thursday. His mobile number is [phone].

## 2011-04-29 NOTE — Telephone Encounter (Signed)
This patient has 6949 lead and if it s beeping it may be an urgent problem  i have called his cell phone and told him we would see him tomorrow if it is his desire that we be involved  In fact he could go to device clinic and if it is 6949 then he goes directly to [facility] and does not  Pass go and does not collect his 200$\ thanks

## 2011-04-30 ENCOUNTER — Encounter (HOSPITAL_COMMUNITY): Payer: Self-pay | Admitting: General Practice

## 2011-04-30 ENCOUNTER — Other Ambulatory Visit: Payer: Self-pay

## 2011-04-30 ENCOUNTER — Ambulatory Visit (HOSPITAL_COMMUNITY)
Admission: AD | Admit: 2011-04-30 | Discharge: 2011-05-01 | Disposition: A | Discharge status: Home / Self Care | Payer: 59 | Source: Ambulatory Visit | Attending: Internal Medicine | Admitting: Internal Medicine

## 2011-04-30 ENCOUNTER — Encounter (HOSPITAL_COMMUNITY)
Admission: AD | Disposition: A | Discharge status: Home / Self Care | Payer: Self-pay | Source: Ambulatory Visit | Attending: Internal Medicine

## 2011-04-30 DIAGNOSIS — Y849 Medical procedure, unspecified as the cause of abnormal reaction of the patient, or of later complication, without mention of misadventure at the time of the procedure: Secondary | ICD-10-CM | POA: Insufficient documentation

## 2011-04-30 DIAGNOSIS — I10 Essential (primary) hypertension: Secondary | ICD-10-CM | POA: Insufficient documentation

## 2011-04-30 DIAGNOSIS — T82198A Other mechanical complication of other cardiac electronic device, initial encounter: Secondary | ICD-10-CM | POA: Insufficient documentation

## 2011-04-30 DIAGNOSIS — Z23 Encounter for immunization: Secondary | ICD-10-CM | POA: Insufficient documentation

## 2011-04-30 DIAGNOSIS — I2589 Other forms of chronic ischemic heart disease: Secondary | ICD-10-CM

## 2011-04-30 DIAGNOSIS — I428 Other cardiomyopathies: Secondary | ICD-10-CM | POA: Insufficient documentation

## 2011-04-30 DIAGNOSIS — I252 Old myocardial infarction: Secondary | ICD-10-CM | POA: Insufficient documentation

## 2011-04-30 DIAGNOSIS — Z95810 Presence of automatic (implantable) cardiac defibrillator: Secondary | ICD-10-CM | POA: Insufficient documentation

## 2011-04-30 DIAGNOSIS — I255 Ischemic cardiomyopathy: Secondary | ICD-10-CM | POA: Insufficient documentation

## 2011-04-30 DIAGNOSIS — E785 Hyperlipidemia, unspecified: Secondary | ICD-10-CM | POA: Insufficient documentation

## 2011-04-30 DIAGNOSIS — I2510 Atherosclerotic heart disease of native coronary artery without angina pectoris: Secondary | ICD-10-CM | POA: Insufficient documentation

## 2011-04-30 HISTORY — PX: OTHER SURGICAL HISTORY: SHX169

## 2011-04-30 HISTORY — DX: Ischemic cardiomyopathy: I25.5

## 2011-04-30 HISTORY — DX: Basal cell carcinoma of skin, unspecified: C44.91

## 2011-04-30 HISTORY — DX: Atherosclerotic heart disease of native coronary artery without angina pectoris: I25.10

## 2011-04-30 HISTORY — DX: Essential (primary) hypertension: I10

## 2011-04-30 HISTORY — PX: LEAD REVISION: SHX5945

## 2011-04-30 HISTORY — DX: Pure hypercholesterolemia, unspecified: E78.00

## 2011-04-30 LAB — CBC
HCT: 43.6 % (ref 39.0–52.0)
Hemoglobin: 15.5 g/dL (ref 13.0–17.0)
MCH: 32 pg (ref 26.0–34.0)
MCHC: 35.6 g/dL (ref 30.0–36.0)
MCV: 90.1 fL (ref 78.0–100.0)
Platelets: 193 10*3/uL (ref 150–400)
RBC: 4.84 MIL/uL (ref 4.22–5.81)
RDW: 13.1 % (ref 11.5–15.5)
WBC: 7.4 10*3/uL (ref 4.0–10.5)

## 2011-04-30 LAB — BASIC METABOLIC PANEL
BUN: 10 mg/dL (ref 6–23)
CO2: 24 mEq/L (ref 19–32)
Calcium: 9.4 mg/dL (ref 8.4–10.5)
Chloride: 101 mEq/L (ref 96–112)
Creatinine, Ser: 0.94 mg/dL (ref 0.50–1.35)
GFR calc Af Amer: >90 mL/min (ref >90)
GFR calc non Af Amer: >90 mL/min (ref >90)
Glucose, Bld: 93 mg/dL (ref 70–99)
Potassium: 4.6 mEq/L (ref 3.5–5.1)
Sodium: 135 mEq/L (ref 135–145)

## 2011-04-30 LAB — PROTIME-INR: INR: 1.01 (ref 0.00–1.49)

## 2011-04-30 LAB — DIFFERENTIAL
Basophils Absolute: 0.1 10*3/uL (ref 0.0–0.1)
Basophils Relative: 1 % (ref 0–1)
Eosinophils Absolute: 0.4 10*3/uL (ref 0.0–0.7)
Eosinophils Relative: 6 % — ABNORMAL HIGH (ref 0–5)
Lymphocytes Relative: 49 % — ABNORMAL HIGH (ref 12–46)
Lymphs Abs: 3.7 10*3/uL (ref 0.7–4.0)
Monocytes Absolute: 0.6 10*3/uL (ref 0.1–1.0)
Monocytes Relative: 7 % (ref 3–12)
Neutro Abs: 2.7 10*3/uL (ref 1.7–7.7)
Neutrophils Relative %: 37 % — ABNORMAL LOW (ref 43–77)

## 2011-04-30 LAB — SURGICAL PCR SCREEN
MRSA, PCR: NEGATIVE
Staphylococcus aureus: NEGATIVE

## 2011-04-30 SURGERY — LEAD REVISION
Anesthesia: LOCAL

## 2011-04-30 MED ORDER — CEFAZOLIN SODIUM 1-5 GM-% IV SOLN
1.0000 g | Freq: Four times a day (QID) | INTRAVENOUS | Status: AC
  Administered 2011-04-30 – 2011-05-01 (×2): 1 g via INTRAVENOUS
  Filled 2011-04-30 (×2): qty 50

## 2011-04-30 MED ORDER — SIMVASTATIN 20 MG PO TABS
20.0000 mg | ORAL_TABLET | Freq: Every day | ORAL | Status: DC
  Filled 2011-04-30: qty 1

## 2011-04-30 MED ORDER — MUPIROCIN 2 % EX OINT
TOPICAL_OINTMENT | CUTANEOUS | Status: AC
  Administered 2011-04-30: 1 via NASAL
  Filled 2011-04-30: qty 22

## 2011-04-30 MED ORDER — CEFAZOLIN SODIUM 1-5 GM-% IV SOLN
1.0000 g | Freq: Four times a day (QID) | INTRAVENOUS | Status: DC
  Filled 2011-04-30 (×4): qty 50

## 2011-04-30 MED ORDER — SODIUM CHLORIDE 0.9 % IR SOLN
80.0000 mg | Status: DC
  Filled 2011-04-30 (×3): qty 2

## 2011-04-30 MED ORDER — SODIUM CHLORIDE 0.45 % IV SOLN
INTRAVENOUS | Status: DC
  Administered 2011-04-30: 15:00:00 via INTRAVENOUS

## 2011-04-30 MED ORDER — MUPIROCIN 2 % EX OINT
TOPICAL_OINTMENT | Freq: Once | CUTANEOUS | Status: AC
  Administered 2011-04-30: 1 via NASAL

## 2011-04-30 MED ORDER — LIDOCAINE HCL (PF) 1 % IJ SOLN
INTRAMUSCULAR | Status: AC
  Filled 2011-04-30: qty 60

## 2011-04-30 MED ORDER — NIACIN 500 MG PO TABS
500.0000 mg | ORAL_TABLET | Freq: Every day | ORAL | Status: DC
  Administered 2011-05-01: 500 mg via ORAL
  Filled 2011-04-30: qty 1

## 2011-04-30 MED ORDER — CHLORHEXIDINE GLUCONATE 4 % EX LIQD
60.0000 mL | Freq: Once | CUTANEOUS | Status: DC
  Filled 2011-04-30: qty 60

## 2011-04-30 MED ORDER — LISINOPRIL 20 MG PO TABS
20.0000 mg | ORAL_TABLET | Freq: Every day | ORAL | Status: DC
  Administered 2011-05-01: 20 mg via ORAL
  Filled 2011-04-30: qty 1

## 2011-04-30 MED ORDER — CEFAZOLIN SODIUM-DEXTROSE 2-3 GM-% IV SOLR
2.0000 g | INTRAVENOUS | Status: DC
  Filled 2011-04-30: qty 50

## 2011-04-30 MED ORDER — MIDAZOLAM HCL 5 MG/5ML IJ SOLN
INTRAMUSCULAR | Status: AC
  Filled 2011-04-30: qty 5

## 2011-04-30 MED ORDER — CEFAZOLIN SODIUM-DEXTROSE 2-3 GM-% IV SOLR
INTRAVENOUS | Status: AC
  Filled 2011-04-30: qty 50

## 2011-04-30 MED ORDER — PNEUMOCOCCAL VAC POLYVALENT 25 MCG/0.5ML IJ INJ
0.5000 mL | INJECTION | INTRAMUSCULAR | Status: AC
  Administered 2011-05-01: 0.5 mL via INTRAMUSCULAR
  Filled 2011-04-30: qty 0.5

## 2011-04-30 MED ORDER — FENTANYL CITRATE 0.05 MG/ML IJ SOLN
INTRAMUSCULAR | Status: AC
Start: 2011-04-30 — End: 2011-04-30
  Filled 2011-04-30: qty 2

## 2011-04-30 MED ORDER — SODIUM CHLORIDE 0.9 % IV SOLN
INTRAVENOUS | Status: DC
  Administered 2011-04-30: 15:00:00 via INTRAVENOUS

## 2011-04-30 MED ORDER — OXYCODONE HCL 5 MG PO TABS
5.0000 mg | ORAL_TABLET | Freq: Once | ORAL | Status: AC
  Administered 2011-05-01: 5 mg via ORAL
  Filled 2011-04-30: qty 1

## 2011-04-30 MED ORDER — ONDANSETRON HCL 4 MG/2ML IJ SOLN: 4.0000 mg | Freq: Four times a day (QID) | INTRAMUSCULAR | Status: DC | PRN

## 2011-04-30 MED ORDER — FENTANYL CITRATE 0.05 MG/ML IJ SOLN
INTRAMUSCULAR | Status: AC
  Filled 2011-04-30: qty 2

## 2011-04-30 MED ORDER — CARVEDILOL 25 MG PO TABS
25.0000 mg | ORAL_TABLET | Freq: Two times a day (BID) | ORAL | Status: DC
  Administered 2011-04-30 – 2011-05-01 (×2): 25 mg via ORAL
  Filled 2011-04-30 (×5): qty 1

## 2011-04-30 MED ORDER — CLOPIDOGREL BISULFATE 75 MG PO TABS
75.0000 mg | ORAL_TABLET | Freq: Every day | ORAL | Status: DC
  Administered 2011-05-01: 75 mg via ORAL
  Filled 2011-04-30 (×3): qty 1

## 2011-04-30 MED ORDER — ACETAMINOPHEN 325 MG PO TABS
325.0000 mg | ORAL_TABLET | ORAL | Status: DC | PRN
  Administered 2011-04-30 – 2011-05-01 (×2): 650 mg via ORAL
  Filled 2011-04-30 (×2): qty 2

## 2011-04-30 NOTE — Telephone Encounter (Signed)
Per [name], she spoke to [name] who checked him at [facility] on Tuesday. She states this is a 6949 lead problem. Per [name], she instructed the patient to go to the ER and we will see him there. [name] was to fax his interrogation to [facility] Rosann Auerbach).

## 2011-04-30 NOTE — Progress Notes (Signed)
Per report from Caralyn Guile, ancef was given by OR nurse.

## 2011-04-30 NOTE — H&P (Signed)
  [name] is an 56 y.o. male.   Chief Complaint: I am here to have my ICD fixed HPI: The patient is a 56 yo man with an ICM, s/p MI with LV dysfunction. He had a single chamber ICD placed in 2005. His 6949 lead has broken and his device started beeping a week ago. He was seen in his doctor's office in [facility] yesterday where a broken Medtronic [phone] lead was confirmed and he is now admitted for ICD lead revision. His old device (over 50 years old) is nearing ERI and he will have his device changed out as well. Of note the patient stated that he did not want to have his procedure done in [facility].  No past medical history on file.  No past surgical history on file.  No family history on file. Social History:  does not have a smoking history on file. He does not have any smokeless tobacco history on file. His alcohol and drug histories not on file.  Allergies:  Allergies  Allergen Reactions  . Sulfonamide Derivatives     REACTION: Pt prefers not to take    Medications Prior to Admission  Medication Dose Route Frequency Provider Last Rate Last Dose  . 0.45 % sodium chloride infusion   Intravenous Continuous Lewayne Bunting, MD      . 0.9 %  sodium chloride infusion   Intravenous Continuous Lewayne Bunting, MD      . ceFAZolin (ANCEF) 2-3 GM-% IVPB SOLR           . ceFAZolin (ANCEF) IVPB 2 g/50 mL premix  2 g Intravenous On Call Lewayne Bunting, MD      . chlorhexidine (HIBICLENS) 4 % liquid 4 application  60 mL Topical Once Lewayne Bunting, MD      . gentamicin (GARAMYCIN) 80 mg in sodium chloride irrigation 0.9 % 500 mL irrigation  80 mg Irrigation On Call Lewayne Bunting, MD      . mupirocin ointment Idelle Jo) 2 %   Nasal Once Lewayne Bunting, MD      . mupirocin ointment (BACTROBAN) 2 %            No current outpatient prescriptions on file as of 04/30/2011.    No results found for this or any previous visit (from the past 48 hour(s)). No results found.  @ROS @ PE Blood pressure  119/76, pulse 80, temperature 97.4 F (36.3 C), temperature source Oral, resp. rate 18, height 5' (1.524 m), weight 68.04 kg (150 lb), SpO2 97.00%. Well appearing NAD HEENT: Unremarkable Neck:  No JVD, no thyromegally Lymphatics:  No adenopathy Back:  No CVA tenderness Lungs:  Clear HEART:  Regular rate rhythm, no murmurs, no rubs, no clicks Abd:  Flat, positive bowel sounds, no organomegally, no rebound, no guarding Ext:  2 plus pulses, no edema, no cyanosis, no clubbing Skin:  No rashes no nodules Neuro:  CN II through XII intact, motor grossly intact EKG - NSR Assessment/Plan 1. ICM 2. Class 1 CHF 3. Fractured [id number] lead Rec: I have discussed the risks/benefits/goals/expectations of ICD lead revision and generator change and he wishes to proceed.  Lewayne Bunting 04/30/2011, 2:54 PM

## 2011-04-30 NOTE — Op Note (Signed)
ICD lead and new generator placed and Old ICD generator removed with DFT testing without immediate complciation.Z#[id number].

## 2011-05-01 ENCOUNTER — Encounter (HOSPITAL_COMMUNITY): Payer: Self-pay | Admitting: Physician Assistant

## 2011-05-01 ENCOUNTER — Ambulatory Visit (HOSPITAL_COMMUNITY): Payer: 59

## 2011-05-01 ENCOUNTER — Other Ambulatory Visit: Payer: Self-pay

## 2011-05-01 DIAGNOSIS — I255 Ischemic cardiomyopathy: Secondary | ICD-10-CM | POA: Insufficient documentation

## 2011-05-01 NOTE — Op Note (Signed)
NAMEGRAYSEN, [name] NO.:  [id number]  MEDICAL RECORD NO.:  [id number]  LOCATION:  2028                         FACILITY:  [facility]  PHYSICIAN:  Doylene Canning. Ladona Ridgel, MD    DATE OF BIRTH:  1955-08-10  DATE OF PROCEDURE:  04/30/2011 DATE OF DISCHARGE:                              OPERATIVE REPORT   PROCEDURE PERFORMED:  Insertion of a new active fixation defibrillation lead, removal of a previously implanted single-chamber defibrillator which was approaching but not yet at elective replacement, and insertion of a new single-chamber defibrillator.  INTRODUCTION:  The patient is a 56 year old man with an ischemic cardiomyopathy, status post MI.  He underwent ICD implantation over 7 years ago.  His defibrillator lead was found to have fractured based on elevations in the pacing threshold, reduction of the R-wave, and elevation of the pacing impedance.  He also had multiple short RR counters.  He presents today for ICD revision.  PROCEDURE:  After informed consent was obtained, the patient was taken to the diagnostic EP lab in a fasting state.  After usual preparation and draping, intravenous fentanyl and midazolam was given for sedation. 30 mL of lidocaine was infiltrated into the left infraclavicular region. A 7-cm incision was carried out over this region and electrocautery was utilized to dissect down to the fascial plane.  The left subclavian vein was punctured and a Medtronic model 6935 65 cm active fixation defibrillator lead, serial number [id number] was advanced into the right ventricle.  Mapping was carried out in the final site, R-waves measured 10 mV, and the pacing impedance was 800 ohms.  Threshold was 0.4 V at 0.5 msec and there was a large injury current with active fixation of the lead.  With the defibrillation lead in satisfactory position, it was secured to the subpectoral fascia with a figure-of-eight silk suture. The sewing sleeve was secured with  silk suture.  Electrocautery was then utilized to dissect down to the ICD pocket.  The old Medtronic defibrillator was removed with gentle traction.  The old defibrillation lead, which was had failed was capped.  The pocket was revised.  The new Medtronic single-chamber defibrillator, serial number [id number] was connected to the defibrillation lead and placed back in the subcutaneous pocket.  The pocket was irrigated with antibiotic irrigation and the incision was closed with 2-0 and 3-0 Vicryl.  At this point, the patient was more deeply sedated under my direct supervision after I had scrubbed out of the case and defibrillation threshold testing was carried out.  VF was induced with a T-wave shock. A 20-joule shock was delivered, which terminated ventricular fibrillation and restored sinus rhythm.  At this point, no additional defibrillation threshold testing was carried out, and the patient was allowed to awaken and returned to his room in satisfactory condition.  COMPLICATIONS:  There were no immediate procedure complications.  RESULTS:  This demonstrates successful insertion of a new BiV-ICD, insertion of a new active fixation defibrillation lead, and removal of her previously implanted Medtronic defibrillator.  The old lead was capped.     Doylene Canning. Ladona Ridgel, MD     GWT/MEDQ  D:  04/30/2011  T:  05/01/2011  Job:  [id number]  cc:   [name], MD, [facility] [name], MD

## 2011-05-01 NOTE — Progress Notes (Signed)
Pt given discharge instructions, medication lists, s/sx infection, acitivity, diet, follow up appointments, and when to call the doctor.  Pt/family verbalized understanding of instructions. Thomas Hoff

## 2011-05-01 NOTE — Discharge Summary (Signed)
Discharge Summary   Patient ID: [name] MRN: [id number], DOB/AGE: 24-Jun-1955 56 y.o. Admit date: 04/30/2011 D/C date:     05/01/2011   Primary Discharge Diagnoses:  1. ICD Lead Malfunction [phone] lead) & device nearing ERI s/p new lead insertion and Medtronic generator change 04/30/11 2. ICM (EF 30-35% by cath in 2005) s/p initial ICD placement 2005  [place] Discharge Diagnoses:  1. CAD  - s/p anterior wall MI October 2005 s/p stent of prox LAD - repeat intervention at that time for ruptured plaque in RCA which had cleared so significantly that PCI was no longer contemplated 2. VF arrest at time of MI October 2005 3. HTN 4. HL  Hospital Course: 56 y/o M with hx of CAD, ICM s/p ICD 2005 with [phone] lead. His device started beeping a week ago. He was seen in his doctor's office in [facility] 04/29/11 where a broken Medtronic 6949 lead was confirmed and he was admitted yesterday for ICD lead revision. His old device (over 54 years old) was nearing ERI and therefore plan was to change out device as well. The patient stated he did not want to have his procedure done in [facility] so had this done at [facility] yesterday, and ultimately had successful insertion of a new active fixation defibrillation lead, removal of a previously implanted single-chamber defibrillator which was approaching but not yet at elective replacement, and insertion of a new single-chamber defibrillator. The patient tolerated the procedure well. CXR showed no acute abnormalities. The patient was seen and examined today and felt stable for discharge by Dr. Graciela Husbands.   Discharge Vitals: Blood pressure 118/74, pulse 77, temperature 98 F (36.7 C), temperature source Oral, resp. rate 18, height 5' (1.524 m), weight 150 lb (68.04 kg), SpO2 96.00%.  Labs: Lab Results  Component Value Date   WBC 7.4 04/30/2011   HGB 15.5 04/30/2011   HCT 43.6 04/30/2011   MCV 90.1 04/30/2011   PLT 193 04/30/2011     Lab 04/30/11 1443  NA  135  K 4.6  CL 101  CO2 24  BUN 10  CREATININE 0.94  CALCIUM 9.4  PROT --  BILITOT --  ALKPHOS --  ALT --  AST --  GLUCOSE 93   Diagnostic Studies/Procedures   1. Chest 2 View 05/01/2011  *RADIOLOGY REPORT*  Clinical Data: Post revision of AICD lead  CHEST - 2 VIEW  Comparison: 02/20/2004  Findings: Left subclavian AICD now with 2 leads projecting over right ventricle. Upper normal heart size. Mediastinal contours and pulmonary vascularity normal. Lungs clear. No pleural effusion or pneumothorax. Bones unremarkable.  IMPRESSION: No acute abnormalities.  Original Report Authenticated By: Lollie Marrow, M.D.  2. ICD lead / device revision 04/30/2011.   Discharge Medications   Medication List  As of 05/01/2011 11:28 AM   TAKE these medications         aspirin 81 MG tablet   Take 81 mg by mouth daily.      atorvastatin 20 MG tablet   Commonly known as: LIPITOR   Take 10 mg by mouth every morning.      carvedilol 25 MG tablet   Commonly known as: COREG   Take 25 mg by mouth 2 (two) times daily with a meal.      clopidogrel 75 MG tablet   Commonly known as: PLAVIX   Take 75 mg by mouth every morning.      COQ10 PO   Take 1 capsule by  mouth every morning.      FISH OIL PO   Take 1 capsule by mouth 2 (two) times daily.      lisinopril 20 MG tablet   Commonly known as: PRINIVIL,ZESTRIL   Take 20 mg by mouth every morning.      niacin 500 MG tablet   Take 500 mg by mouth every morning.      RED YEAST RICE PO   Take 1 capsule by mouth 2 (two) times daily.          Although initially omitted from his med list, the patient confirmed he takes a baby aspirin at home daily.  Disposition   The patient will be discharged in stable condition to home. Discharge Orders    Future Appointments: Provider: Department: Dept Phone: Center:   05/14/2011 11:00 AM [facility] [place] [phone] [facility]   08/04/2011 9:30 AM [name], MD [facility] [phone] [facility]     Future Orders Please Complete By Expires   Diet - low sodium heart healthy      Increase activity slowly      Comments:   Please see attached sheet for instructions on wound care, activity, and bathing.       Follow-up Information    Follow up with [facility]. (Wound check on 05/14/11 at 11am)    Contact information:   [address] [place] [address] [phone]      Follow up with Sherryl Manges, MD. (08/04/11 at 9:30am)    Contact information:   [address], [address] [place] [address] [phone]            Duration of Discharge Encounter: Greater than 30 minutes including physician and PA time.  Signed, Ronie Spies PA-C 05/01/2011, 11:28 AM

## 2011-05-01 NOTE — Discharge Instructions (Signed)
   Supplemental Discharge Instructions for  Pacemaker/Defibrillator Patients  Activity No heavy lifting or vigorous activity with your left/right arm for 6 to 8 weeks.  Do not raise your left/right arm above your head for one week.  Gradually raise your affected arm as drawn below.             05/05/11                            05/06/11                     05/07/11                  05/08/11  NO DRIVING for until after your wound check WOUND CARE   Keep the wound area clean and dry.  Do not get this area wet for one week. No showers for one week; you may shower on 05/08/11   The tape/steri-strips on your wound will fall off; do not pull them off.  No bandage is needed on the site.  DO  NOT apply any creams, oils, or ointments to the wound area.   If you notice any drainage or discharge from the wound, any swelling or bruising at the site, or you develop a fever > 101? F after you are discharged home, call the office at once.  Special Instructions   You are still able to use cellular telephones; use the ear opposite the side where you have your pacemaker/defibrillator.  Avoid carrying your cellular phone near your device.   When traveling through airports, show security personnel your identification card to avoid being screened in the metal detectors.  Ask the security personnel to use the hand wand.   Avoid arc welding equipment, MRI testing (magnetic resonance imaging), TENS units (transcutaneous nerve stimulators).  Call the office for questions about other devices.   Avoid electrical appliances that are in poor condition or are not properly grounded.   Microwave ovens are safe to be near or to operate.  Additional information for defibrillator patients should your device go off:   If your device goes off ONCE and you feel fine afterward, notify the device clinic nurses.   If your device goes off ONCE and you do not feel well afterward, call 911.   If your device goes off TWICE, call 911.    If your device goes off THREE times in one day, call 911.  DO NOT DRIVE YOURSELF OR A FAMILY MEMBER WITH A DEFIBRILLATOR TO THE HOSPITAL--CALL 911.

## 2011-05-01 NOTE — Progress Notes (Signed)
HPI  [name] is a 56 y.o. male  Seen following ICD generator replacement and new ICD lead to relplace [phone] lead  Without chest pain or shortness of breath Past Medical History  Diagnosis Date  . Hypertension   . High cholesterol   . CHF (congestive heart failure)   . Angina   . Myocardial infarction 2005  . 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 Icd placement 2005  . Icd replaced 04/30/11    ICD lead replaced; old ICD removed; new ICD placed  . Nasal septum surgery 1988  . Appendectomy ~ 1974  . Coronary angioplasty with stent placement 2005    Current Facility-Administered Medications  Medication Dose Route Frequency Provider Last Rate Last Dose  . acetaminophen (TYLENOL) tablet 325-650 mg  325-650 mg Oral Q4H PRN Lewayne Bunting, MD   650 mg at 05/01/11 0842  . carvedilol (COREG) tablet 25 mg  25 mg Oral BID WC Lewayne Bunting, MD   25 mg at 05/01/11 9147  . ceFAZolin (ANCEF) 2-3 GM-% IVPB SOLR           . ceFAZolin (ANCEF) IVPB 1 g/50 mL premix  1 g Intravenous Q6H Lewayne Bunting, MD   1 g at 05/01/11 0349  . clopidogrel (PLAVIX) tablet 75 mg  75 mg Oral QAC breakfast Lewayne Bunting, MD   75 mg at 05/01/11 0842  . fentaNYL (SUBLIMAZE) 0.05 MG/ML injection           . fentaNYL (SUBLIMAZE) 0.05 MG/ML injection           . lidocaine (XYLOCAINE) 1 % injection           . lisinopril (PRINIVIL,ZESTRIL) tablet 20 mg  20 mg Oral Daily Lewayne Bunting, MD      . midazolam (VERSED) 5 MG/5ML injection           . midazolam (VERSED) 5 MG/5ML injection           . midazolam (VERSED) 5 MG/5ML injection           . mupirocin ointment (BACTROBAN) 2 %   Nasal Once Lewayne Bunting, MD   1 application at 04/30/11 1504  . niacin tablet 500 mg  500 mg Oral Daily Lewayne Bunting, MD      . ondansetron [facility]) injection 4 mg  4 mg Intravenous Q6H PRN Lewayne Bunting, MD      . oxyCODONE (Oxy IR/ROXICODONE) immediate release tablet 5 mg  5 mg Oral  Once Marca Ancona, MD   5 mg at 05/01/11 0031  . pneumococcal 23 valent vaccine (PNU-IMMUNE) injection 0.5 mL  0.5 mL Intramuscular Tomorrow-1000 Lewayne Bunting, MD      . simvastatin (ZOCOR) tablet 20 mg  20 mg Oral q1800 Lewayne Bunting, MD      . DISCONTD: 0.45 % sodium chloride infusion   Intravenous Continuous Lewayne Bunting, MD 50 mL/hr at 04/30/11 1503    . DISCONTD: 0.9 %  sodium chloride infusion   Intravenous Continuous Lewayne Bunting, MD 50 mL/hr at 04/30/11 1503    . DISCONTD: ceFAZolin (ANCEF) IVPB 1 g/50 mL premix  1 g Intravenous Q6H Lewayne Bunting, MD      . DISCONTD: ceFAZolin (ANCEF) IVPB 2 g/50 mL premix  2 g Intravenous On Call Lewayne Bunting, MD      . DISCONTD: chlorhexidine (HIBICLENS) 4 % liquid 4 application  60 mL Topical Once Lewayne Bunting,  MD      . DISCONTD: gentamicin (GARAMYCIN) 80 mg in sodium chloride irrigation 0.9 % 500 mL irrigation  80 mg Irrigation On Call Lewayne Bunting, MD        Allergies  Allergen Reactions  . Sulfonamide Derivatives     REACTION: Pt prefers not to take    Review of Systems negative except from HPI and PMH  Physical Exam BP 110/74  Pulse 77  Temp(Src) 98 F (36.7 C) (Oral)  Resp 18  Ht 5' (1.524 m)  Wt 150 lb (68.04 kg)  BMI 29.30 kg/m2  SpO2 96% Well developed and well nourished in no acute distress HENT normal E scleral and icterus clear Neck Supple JVP flat Pocket with small hematoma--pressure applied x 10 minutes Clear to ausculation Regular rate and rhythm, no murmurs gallops or rub Soft with active bowel sounds No clubbing cyanosis none Edema Alert and oriented, grossly normal motor and sensory function Skin Warm and Dry   Assessment and  Plan  S/p generator and lead revision    D/c to home Wound check LHC 10-14days No driving until then F/u sk 3months

## 2011-05-01 NOTE — Progress Notes (Signed)
Pt smokes 1/4 -1/2 ppd and is interested in quitting but says he needs help at this point. He's cut down from 1 ppd to 1/4 ppd. Recommended 2 mg nicotine lozenges PRN and reviewed instructions and side effects. Pt eager to try. Referred to 1-800 quit now for f/u and support. Discussed oral fixation substitutes, second hand smoke and in home smoking policy. Reviewed and gave pt Written education/contact information.

## 2011-05-14 ENCOUNTER — Encounter: Payer: Self-pay | Admitting: Internal Medicine

## 2011-05-14 ENCOUNTER — Encounter: Payer: Self-pay | Admitting: *Deleted

## 2011-05-14 ENCOUNTER — Ambulatory Visit (INDEPENDENT_AMBULATORY_CARE_PROVIDER_SITE_OTHER): Payer: 59 | Admitting: *Deleted

## 2011-05-14 DIAGNOSIS — Z95810 Presence of automatic (implantable) cardiac defibrillator: Secondary | ICD-10-CM

## 2011-05-14 DIAGNOSIS — I428 Other cardiomyopathies: Secondary | ICD-10-CM

## 2011-05-14 LAB — ICD DEVICE OBSERVATION
BATTERY VOLTAGE: 3.219 V
BRDY-0002RV: 40 {beats}/min
DEV-0020ICD: NEGATIVE
FVT: 0
HV IMPEDENCE: 63 Ohm
RV LEAD IMPEDENCE ICD: 456 Ohm
RV LEAD THRESHOLD: 0.875 V
TOT-0002: 0
TZAT-0001FASTVT: 1
TZAT-0002SLOWVT: NEGATIVE
TZAT-0004FASTVT: 8
TZAT-0013FASTVT: 1
TZAT-0018FASTVT: NEGATIVE
TZAT-0020FASTVT: 1.5 ms
TZAT-0020SLOWVT: 1.5 ms
TZST-0001FASTVT: 2
TZST-0001FASTVT: 4
TZST-0001SLOWVT: 3
TZST-0001SLOWVT: 5
TZST-0002SLOWVT: NEGATIVE
TZST-0002SLOWVT: NEGATIVE
TZST-0003FASTVT: 35 J
TZST-0003FASTVT: 35 J
VF: 0

## 2011-05-14 NOTE — Progress Notes (Signed)
Wound check in clinic  

## 2011-05-28 ENCOUNTER — Encounter: Payer: Self-pay | Admitting: Internal Medicine

## 2011-05-28 ENCOUNTER — Telehealth: Payer: Self-pay | Admitting: Internal Medicine

## 2011-05-28 NOTE — Telephone Encounter (Signed)
Walk in pt form " Pt Needs letter to Go back to Work" & Pt also Dropped Off Initial Disability statement form for Completion, will forward this to [facility] 05/28/11/KM

## 2011-07-06 ENCOUNTER — Telehealth: Payer: Self-pay | Admitting: Internal Medicine

## 2011-07-06 NOTE — Telephone Encounter (Signed)
Please return call to patient regarding the return of documentation/paperwork.  Patient can be reached at [phone].

## 2011-07-06 NOTE — Telephone Encounter (Signed)
LMTCB

## 2011-07-08 NOTE — Telephone Encounter (Signed)
Will forward to Heather McGhee . 

## 2011-07-08 NOTE — Telephone Encounter (Signed)
Paperwork was returned to medical records yesterday to return to [employer].

## 2011-07-22 ENCOUNTER — Telehealth: Payer: Self-pay | Admitting: Internal Medicine

## 2011-07-22 NOTE — Telephone Encounter (Signed)
New problem:  Per Archivist,. Waiting on documentation / paperwork to be faxed to office. Need to be done today. Regarding [employer]- short term disability.

## 2011-07-22 NOTE — Telephone Encounter (Addendum)
Spoke With Patricia/[employer] Let her Know of the Situation with the Paperwork for this Pt, I have a Copy of the Physician's Statement and Will send her One, she Stated She will Complete and Get back to Me [place] the [place] Tomorrow (07/23/11), so I can Get to Dr.Klein to Sign Again, I have also Staff Messaged  Geroge Baseman of the Situation also   07/22/11/KM

## 2011-07-22 NOTE — Telephone Encounter (Signed)
Called and Spoke with Nurse, adult, she Stated she has not Received any Completed paperwork for this  Pt,I have Checked with Tammy, and Checked through [employer], I [employer]

## 2011-07-22 NOTE — Telephone Encounter (Signed)
Called medical records, [name] will handle paperwork issue.

## 2011-07-22 NOTE — Telephone Encounter (Signed)
To Heather

## 2011-07-28 NOTE — Telephone Encounter (Signed)
Attending Physicians Statement was completed again and given to Denny Peon in medical records today.

## 2011-07-29 ENCOUNTER — Encounter: Payer: Self-pay | Admitting: *Deleted

## 2011-08-03 ENCOUNTER — Encounter: Payer: Self-pay | Admitting: *Deleted

## 2011-08-04 ENCOUNTER — Encounter: Payer: 59 | Admitting: Internal Medicine

## 2011-08-26 ENCOUNTER — Encounter: Payer: 59 | Admitting: Internal Medicine

## 2011-10-15 ENCOUNTER — Encounter: Payer: 59 | Admitting: Internal Medicine

## 2012-06-15 ENCOUNTER — Telehealth: Payer: Self-pay | Admitting: Internal Medicine

## 2012-06-15 NOTE — Telephone Encounter (Signed)
Pt called On 07/28/11 for Pick up Of Initial Disability Claim Form, However Pt has Not Picked up yet This Will be mailed out today to Pt Home address  06/15/12/KM

## 2014-02-01 ENCOUNTER — Encounter (HOSPITAL_COMMUNITY): Payer: Self-pay | Admitting: Internal Medicine

## 2014-11-05 DIAGNOSIS — I2510 Atherosclerotic heart disease of native coronary artery without angina pectoris: Secondary | ICD-10-CM

## 2014-11-05 HISTORY — DX: Atherosclerotic heart disease of native coronary artery without angina pectoris: I25.10

## 2017-04-14 DIAGNOSIS — Z955 Presence of coronary angioplasty implant and graft: Secondary | ICD-10-CM

## 2017-04-14 DIAGNOSIS — E785 Hyperlipidemia, unspecified: Secondary | ICD-10-CM

## 2017-04-14 DIAGNOSIS — I5022 Chronic systolic (congestive) heart failure: Secondary | ICD-10-CM | POA: Insufficient documentation

## 2017-04-14 DIAGNOSIS — I252 Old myocardial infarction: Secondary | ICD-10-CM

## 2017-04-14 HISTORY — DX: Old myocardial infarction: I25.2

## 2017-04-14 HISTORY — DX: Chronic systolic (congestive) heart failure: I50.22

## 2017-04-14 HISTORY — DX: Presence of coronary angioplasty implant and graft: Z95.5

## 2017-04-14 HISTORY — DX: Hyperlipidemia, unspecified: E78.5

## 2017-10-14 ENCOUNTER — Encounter: Payer: Self-pay | Admitting: Cardiology

## 2017-10-14 ENCOUNTER — Ambulatory Visit: Payer: BLUE CROSS/BLUE SHIELD | Admitting: Cardiology

## 2017-10-14 ENCOUNTER — Ambulatory Visit: Payer: 59 | Admitting: Cardiology

## 2017-10-14 VITALS — BP 130/76 | HR 63 | Ht 60.0 in | Wt 127.4 lb

## 2017-10-14 DIAGNOSIS — I5022 Chronic systolic (congestive) heart failure: Secondary | ICD-10-CM

## 2017-10-14 DIAGNOSIS — Z95810 Presence of automatic (implantable) cardiac defibrillator: Secondary | ICD-10-CM

## 2017-10-14 DIAGNOSIS — I252 Old myocardial infarction: Secondary | ICD-10-CM

## 2017-10-14 DIAGNOSIS — E785 Hyperlipidemia, unspecified: Secondary | ICD-10-CM

## 2017-10-14 DIAGNOSIS — I255 Ischemic cardiomyopathy: Secondary | ICD-10-CM

## 2017-10-14 LAB — BASIC METABOLIC PANEL
BUN / CREAT RATIO: 7 — AB (ref 10–24)
BUN: 6 mg/dL — ABNORMAL LOW (ref 8–27)
CALCIUM: 9.2 mg/dL (ref 8.6–10.2)
CO2: 26 mmol/L (ref 20–29)
CREATININE: 0.89 mg/dL (ref 0.76–1.27)
Chloride: 104 mmol/L (ref 96–106)
GFR, EST AFRICAN AMERICAN: 107 mL/min/{1.73_m2} (ref >59)
GFR, EST NON AFRICAN AMERICAN: 93 mL/min/{1.73_m2} (ref >59)
Glucose: 84 mg/dL (ref 65–99)
Potassium: 5.4 mmol/L — ABNORMAL HIGH (ref 3.5–5.2)
Sodium: 142 mmol/L (ref 134–144)

## 2017-10-14 LAB — LIPID PANEL
Chol/HDL Ratio: 3.5 ratio (ref 0.0–5.0)
Cholesterol, Total: 124 mg/dL (ref 100–199)
HDL: 35 mg/dL — ABNORMAL LOW (ref >39)
LDL Calculated: 76 mg/dL (ref 0–99)
Triglycerides: 67 mg/dL (ref 0–149)
VLDL CHOLESTEROL CAL: 13 mg/dL (ref 5–40)

## 2017-10-14 LAB — HEPATIC FUNCTION PANEL
ALK PHOS: 59 IU/L (ref 39–117)
ALT: 55 IU/L — AB (ref 0–44)
AST: 60 IU/L — ABNORMAL HIGH (ref 0–40)
Albumin: 4 g/dL (ref 3.6–4.8)
BILIRUBIN TOTAL: 0.4 mg/dL (ref 0.0–1.2)
BILIRUBIN, DIRECT: 0.17 mg/dL (ref 0.00–0.40)
Total Protein: 7.2 g/dL (ref 6.0–8.5)

## 2017-10-14 NOTE — Progress Notes (Signed)
Cardiology Office Note:    Date:  10/14/2017   ID:  [name], DOB [birth year], MRN [id number]  PCP:  Myer Peer, MD  Cardiologist:  Jenne Campus, MD    Referring MD: Myer Peer, MD   No chief complaint on file. Doing well  History of Present Illness:    [name] is a 62 y.o. male with coronary artery disease status post anterior wall microinfarction and stenting done many years ago.  Seems to be doing well still works very hard can work physically have no difficulty sometimes when he got upset he will get some uneasy sensation in the chest.  No palpitations no dizziness no discharges from the defibrillator.  There is no swelling of lower extremities.  Sadly he still continues to smoke.  Past Medical History:  Diagnosis Date  . 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 CAD (coronary artery disease)    s/p anterior wall MI 2005 with VF arrest at tha ttime s/p stent of prox LAD, with repeat intervention at that time for ruptured plque in RCA which had cleared so significantly that PCI was no longer contemplated   . High cholesterol   . Hypertension   . Ischemic cardiomyopathy    EF 30-35% s/p ICD placement 2005. 6949 lead malfunction/device ERI s/p new lead & generator change (Medtronic) 04/2011.    Past Surgical History:  Procedure Laterality Date  . APPENDECTOMY  ~ 1974  . CORONARY ANGIOPLASTY WITH STENT PLACEMENT  2005  . ICD placement  2005  . ICD replaced  04/30/11   ICD lead replaced; old ICD removed; new ICD placed  . LEAD REVISION N/A 04/30/2011   Procedure: LEAD REVISION;  Surgeon: Evans Lance, MD;  Location: [facility] CATH LAB;  Service: Cardiovascular;  Laterality: N/A;  . NASAL SEPTUM SURGERY  1988    Current Medications: Current Meds  Medication Sig  . aspirin 81 MG tablet Take 81 mg by mouth daily.  Marland Kitchen atorvastatin (LIPITOR) 20 MG tablet Take 10 mg by mouth every morning.  Marland Kitchen buPROPion (WELLBUTRIN XL) 150 MG 24 hr tablet Take 150  mg by mouth daily.  . carvedilol (COREG) 25 MG tablet Take 25 mg by mouth 2 (two) times daily with a meal.  . clopidogrel (PLAVIX) 75 MG tablet Take 75 mg by mouth every morning.  . Coenzyme Q10 (COQ10 PO) Take 1 capsule by mouth every morning.  Marland Kitchen lisinopril (PRINIVIL,ZESTRIL) 20 MG tablet Take 20 mg by mouth every morning.  . niacin 500 MG tablet Take 500 mg by mouth every morning.  . nitroGLYCERIN (NITROSTAT) 0.4 MG SL tablet Place 1 tablet under the tongue every 5 (five) minutes as needed.  . Omega-3 Fatty Acids (FISH OIL PO) Take 1 capsule by mouth 2 (two) times daily.  . Red Yeast Rice Extract (RED YEAST RICE PO) Take 1 capsule by mouth 2 (two) times daily.     Allergies:   Spironolactone; Sulfasalazine; Sulfonamide derivatives; and Codeine   Social History   Socioeconomic History  . Marital status: Married    Spouse name: Not on file  . Number of children: Not on file  . Years of education: Not on file  . Highest education level: Not on file  Occupational History  . Not on file  Social Needs  . Financial resource strain: Not on file  . Food insecurity:    Worry: Not on file    Inability: Not on file  .  Transportation needs:    Medical: Not on file    Non-medical: Not on file  Tobacco Use  . Smoking status: Current Every Day Smoker    Packs/day: 0.25    Years: 30.00    Pack years: 7.50    Types: Cigarettes  . Smokeless tobacco: Never Used  . Tobacco comment: smoking consult entered 04/30/11  Substance and Sexual Activity  . Alcohol use: Yes    Comment: 04/30/11 "2-3 beer q other weekend"  . Drug use: No  . Sexual activity: Yes  Lifestyle  . Physical activity:    Days per week: Not on file    Minutes per session: Not on file  . Stress: Not on file  Relationships  . Social connections:    Talks on phone: Not on file    Gets together: Not on file    Attends religious service: Not on file    Active member of club or organization: Not on file    Attends meetings of  clubs or organizations: Not on file    Relationship status: Not on file  Other Topics Concern  . Not on file  Social History Narrative  . Not on file     Family History: The patient's family history includes Heart attack in his father. ROS:   Please see the history of present illness.    All 14 point review of systems negative except as described per history of present illness  EKGs/Labs/Other Studies Reviewed:    EKG showed normal sinus rhythm rate 63.  There is a Q waves in V1 to V3 indicating old anterior wall microinfarction.  This is a Q wave laterally.  In lead I and aVL.  No acute ST segment changes   Recent Labs: No results found for requested labs within last 8760 hours.  Recent Lipid Panel No results found for: CHOL, TRIG, HDL, CHOLHDL, VLDL, LDLCALC, LDLDIRECT  Physical Exam:    VS:  BP 130/76 (BP Location: Right Arm, Patient Position: Sitting, Cuff Size: Normal)   Pulse 63   Ht 5' (1.524 m)   Wt 127 lb 6.4 oz (57.8 kg)   SpO2 99%   BMI 24.88 kg/m     Wt Readings from Last 3 Encounters:  10/14/17 127 lb 6.4 oz (57.8 kg)  04/30/11 150 lb (68 kg)     GEN:  Well nourished, well developed in no acute distress HEENT: Normal NECK: No JVD; No carotid bruits LYMPHATICS: No lymphadenopathy CARDIAC: RRR, no murmurs, no rubs, no gallops RESPIRATORY:  Clear to auscultation without rales, wheezing or rhonchi  ABDOMEN: Soft, non-tender, non-distended MUSCULOSKELETAL:  No edema; No deformity  SKIN: Warm and dry LOWER EXTREMITIES: no swelling NEUROLOGIC:  Alert and oriented x 3 PSYCHIATRIC:  Normal affect   ASSESSMENT:    1. Ischemic cardiomyopathy   2. Chronic systolic congestive heart failure ([place])   3. Old MI (myocardial infarction)   4. Implantable cardioverter-defibrillator (ICD) in situ   5. Dyslipidemia    PLAN:    In order of problems listed above:  1. Ischemic cardiomyopathy I will ask him to have echocardiogram repeated to recheck left ventricular  ejection fraction he is on beta-blocker as well as ACE inhibitor which I will continue.  In the future we may switch him to [place]. 2. Chronic systolic congestive heart failure compensated he is New [place] Heart Association class I 3. Old myocardial infarction.  Noted 4. ICD present this is a Medtronic device will enroll him to our pacemaker clinic  last interrogation done in June showing normally functioning device 5. Dyslipidemia I will ask him to have fasting lipid profile done.  He will required high intensity statin. 6. Smoking: Obviously problem he was told that he must quit.   Medication Adjustments/Labs and Tests Ordered: Current medicines are reviewed at length with the patient today.  Concerns regarding medicines are outlined above.  No orders of the defined types were placed in this encounter.  Medication changes: No orders of the defined types were placed in this encounter.   Signed, Park Liter, MD, [facility] 10/14/2017 9:58 AM    [place]

## 2017-10-14 NOTE — Patient Instructions (Signed)
Medication Instructions:  Your physician recommends that you continue on your current medications as directed. Please refer to the Current Medication list given to you today.   Labwork: Your physician recommends that you return for lab work today: BMP, LFT, lipid panel.   Testing/Procedures: You had an EKG today.   Your physician has requested that you have an echocardiogram. Echocardiography is a painless test that uses sound waves to create images of your heart. It provides your doctor with information about the size and shape of your heart and how well your heart's chambers and valves are working. This procedure takes approximately one hour. There are no restrictions for this procedure.  Follow-Up: Your physician wants you to follow-up in: 3 months. You will receive a reminder letter in the mail two months in advance. If you don't receive a letter, please call our office to schedule the follow-up appointment.   If you need a refill on your cardiac medications before your next appointment, please call your pharmacy.   Thank you for choosing [facility]! Robyne Peers, RN [phone]    Echocardiogram An echocardiogram, or echocardiography, uses sound waves (ultrasound) to produce an image of your heart. The echocardiogram is simple, painless, obtained within a short period of time, and offers valuable information to your health care provider. The images from an echocardiogram can provide information such as:  Evidence of coronary artery disease (CAD).  Heart size.  Heart muscle function.  Heart valve function.  Aneurysm detection.  Evidence of a past heart attack.  Fluid buildup around the heart.  Heart muscle thickening.  Assess heart valve function.  Tell a health care provider about:  Any allergies you have.  All medicines you are taking, including vitamins, herbs, eye drops, creams, and over-the-counter medicines.  Any problems you or family members  have had with anesthetic medicines.  Any blood disorders you have.  Any surgeries you have had.  Any medical conditions you have.  Whether you are pregnant or may be pregnant. What happens before the procedure? No special preparation is needed. Eat and drink normally. What happens during the procedure?  In order to produce an image of your heart, gel will be applied to your chest and a wand-like tool (transducer) will be moved over your chest. The gel will help transmit the sound waves from the transducer. The sound waves will harmlessly bounce off your heart to allow the heart images to be captured in real-time motion. These images will then be recorded.  You may need an IV to receive a medicine that improves the quality of the pictures. What happens after the procedure? You may return to your normal schedule including diet, activities, and medicines, unless your health care provider tells you otherwise. This information is not intended to replace advice given to you by your health care provider. Make sure you discuss any questions you have with your health care provider. Document Released: 02/07/2000 Document Revised: 09/28/2015 Document Reviewed: 10/17/2012 Elsevier Interactive Patient Education  2017 [employer].

## 2017-10-18 ENCOUNTER — Telehealth: Payer: Self-pay

## 2017-10-18 NOTE — Telephone Encounter (Signed)
Left voicemail for the patient to call the office regarding labs. 

## 2017-10-19 ENCOUNTER — Other Ambulatory Visit: Payer: Self-pay

## 2017-10-19 DIAGNOSIS — I255 Ischemic cardiomyopathy: Secondary | ICD-10-CM

## 2017-10-19 MED ORDER — HYDROCHLOROTHIAZIDE 25 MG PO TABS: 12.5000 mg | ORAL_TABLET | Freq: Every day | ORAL | 1 refills | Status: DC

## 2017-10-27 LAB — BASIC METABOLIC PANEL
BUN/Creatinine Ratio: 13 (ref 10–24)
BUN: 13 mg/dL (ref 8–27)
CALCIUM: 9.6 mg/dL (ref 8.6–10.2)
CO2: 23 mmol/L (ref 20–29)
CREATININE: 0.98 mg/dL (ref 0.76–1.27)
Chloride: 95 mmol/L — ABNORMAL LOW (ref 96–106)
GFR calc Af Amer: 96 mL/min/{1.73_m2} (ref >59)
GFR, EST NON AFRICAN AMERICAN: 83 mL/min/{1.73_m2} (ref >59)
GLUCOSE: 68 mg/dL (ref 65–99)
Potassium: 5 mmol/L (ref 3.5–5.2)
SODIUM: 134 mmol/L (ref 134–144)

## 2017-12-14 ENCOUNTER — Other Ambulatory Visit: Payer: Self-pay

## 2017-12-14 ENCOUNTER — Ambulatory Visit (INDEPENDENT_AMBULATORY_CARE_PROVIDER_SITE_OTHER): Payer: BLUE CROSS/BLUE SHIELD

## 2017-12-14 DIAGNOSIS — I5022 Chronic systolic (congestive) heart failure: Secondary | ICD-10-CM

## 2017-12-14 DIAGNOSIS — I252 Old myocardial infarction: Secondary | ICD-10-CM | POA: Diagnosis not present

## 2017-12-14 DIAGNOSIS — I255 Ischemic cardiomyopathy: Secondary | ICD-10-CM | POA: Diagnosis not present

## 2017-12-14 MED ORDER — PERFLUTREN LIPID MICROSPHERE: 1.0000 mL | INTRAVENOUS | Status: AC | PRN

## 2017-12-14 NOTE — Progress Notes (Signed)
Complete echocardiogram with contrast has been performed.   Jimmy Tanner Vigna RDCS, RVT 

## 2018-01-14 ENCOUNTER — Ambulatory Visit: Payer: BLUE CROSS/BLUE SHIELD | Admitting: Cardiology

## 2018-02-17 ENCOUNTER — Telehealth: Payer: Self-pay | Admitting: Emergency Medicine

## 2018-02-17 ENCOUNTER — Encounter: Payer: Self-pay | Admitting: Cardiology

## 2018-02-17 ENCOUNTER — Ambulatory Visit (INDEPENDENT_AMBULATORY_CARE_PROVIDER_SITE_OTHER): Payer: BLUE CROSS/BLUE SHIELD | Admitting: Cardiology

## 2018-02-17 VITALS — BP 110/78 | HR 82 | Wt 128.4 lb

## 2018-02-17 DIAGNOSIS — I5022 Chronic systolic (congestive) heart failure: Secondary | ICD-10-CM | POA: Diagnosis not present

## 2018-02-17 DIAGNOSIS — E785 Hyperlipidemia, unspecified: Secondary | ICD-10-CM | POA: Diagnosis not present

## 2018-02-17 DIAGNOSIS — I255 Ischemic cardiomyopathy: Secondary | ICD-10-CM | POA: Diagnosis not present

## 2018-02-17 DIAGNOSIS — Z95810 Presence of automatic (implantable) cardiac defibrillator: Secondary | ICD-10-CM | POA: Diagnosis not present

## 2018-02-17 LAB — LIPID PANEL
CHOL/HDL RATIO: 4 ratio (ref 0.0–5.0)
Cholesterol, Total: 133 mg/dL (ref 100–199)
HDL: 33 mg/dL — ABNORMAL LOW (ref >39)
LDL CALC: 80 mg/dL (ref 0–99)
TRIGLYCERIDES: 101 mg/dL (ref 0–149)
VLDL CHOLESTEROL CAL: 20 mg/dL (ref 5–40)

## 2018-02-17 MED ORDER — BUPROPION HCL ER (SR) 150 MG PO TB12: 150.0000 mg | ORAL_TABLET | Freq: Every day | ORAL | 1 refills | Status: DC

## 2018-02-17 NOTE — Addendum Note (Signed)
Addended by: Ashok Norris on: 02/17/2018 09:44 AM   Modules accepted: Orders

## 2018-02-17 NOTE — Addendum Note (Signed)
Addended by: Ashok Norris on: 02/17/2018 10:57 AM   Modules accepted: Orders

## 2018-02-17 NOTE — Progress Notes (Signed)
Cardiology Office Note:    Date:  02/17/2018   ID:  [name], DOB 04/06/1955, MRN [id number]  PCP:  Myer Peer, MD  Cardiologist:  Jenne Campus, MD    Referring MD: Myer Peer, MD   Chief Complaint  Patient presents with  . Follow-up  Doing well cardiac wise  History of Present Illness:    [name] is a 62 y.o. male with coronary artery disease status post myocardial infarction 2005 with akinesis involving LAD territory since that time.  Hemodynamically compensated with ACE inhibitor as well as beta-blocker which I will continue.  Last echocardiogram showed still the same ejection fraction which is diminished.  He does have ICD.  Will enroll him in our pacemaker clinic.  Denies have any chest pain tightness squeezing pressure been chest overall doing well worked hard to have no difficulty doing it however complains about his work a lot he said he need to have 5 more years of work.  Before he retired.  Past Medical History:  Diagnosis Date  . 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 CAD (coronary artery disease)    s/p anterior wall MI 2005 with VF arrest at tha ttime s/p stent of prox LAD, with repeat intervention at that time for ruptured plque in RCA which had cleared so significantly that PCI was no longer contemplated   . High cholesterol   . Hypertension   . Ischemic cardiomyopathy    EF 30-35% s/p ICD placement 2005. 6949 lead malfunction/device ERI s/p new lead & generator change (Medtronic) 04/2011.    Past Surgical History:  Procedure Laterality Date  . APPENDECTOMY  ~ 1974  . CORONARY ANGIOPLASTY WITH STENT PLACEMENT  2005  . ICD placement  2005  . ICD replaced  04/30/11   ICD lead replaced; old ICD removed; new ICD placed  . LEAD REVISION N/A 04/30/2011   Procedure: LEAD REVISION;  Surgeon: Evans Lance, MD;  Location: [facility] CATH LAB;  Service: Cardiovascular;  Laterality: N/A;  . NASAL SEPTUM SURGERY  1988    Current  Medications: Current Meds  Medication Sig  . aspirin 81 MG tablet Take 81 mg by mouth daily.  Marland Kitchen atorvastatin (LIPITOR) 20 MG tablet Take 10 mg by mouth every morning.  . carvedilol (COREG) 25 MG tablet Take 25 mg by mouth 2 (two) times daily with a meal.  . clopidogrel (PLAVIX) 75 MG tablet Take 75 mg by mouth every morning.  . Coenzyme Q10 (COQ10 PO) Take 1 capsule by mouth every morning.  Marland Kitchen lisinopril (PRINIVIL,ZESTRIL) 20 MG tablet Take 20 mg by mouth every morning.  . nitroGLYCERIN (NITROSTAT) 0.4 MG SL tablet Place 1 tablet under the tongue every 5 (five) minutes as needed.  . Omega-3 Fatty Acids (FISH OIL PO) Take 1 capsule by mouth 2 (two) times daily.  . Red Yeast Rice Extract (RED YEAST RICE PO) Take 1 capsule by mouth 2 (two) times daily.     Allergies:   Spironolactone; Sulfasalazine; Sulfonamide derivatives; and Codeine   Social History   Socioeconomic History  . Marital status: Married    Spouse name: Not on file  . Number of children: Not on file  . Years of education: Not on file  . Highest education level: Not on file  Occupational History  . Not on file  Social Needs  . Financial resource strain: Not on file  . Food insecurity:    Worry: Not on file  Inability: Not on file  . Transportation needs:    Medical: Not on file    Non-medical: Not on file  Tobacco Use  . Smoking status: Current Every Day Smoker    Packs/day: 0.25    Years: 30.00    Pack years: 7.50    Types: Cigarettes  . Smokeless tobacco: Never Used  . Tobacco comment: smoking consult entered 04/30/11  Substance and Sexual Activity  . Alcohol use: Yes    Comment: 04/30/11 "2-3 beer q other weekend"  . Drug use: No  . Sexual activity: Yes  Lifestyle  . Physical activity:    Days per week: Not on file    Minutes per session: Not on file  . Stress: Not on file  Relationships  . Social connections:    Talks on phone: Not on file    Gets together: Not on file    Attends religious service:  Not on file    Active member of club or organization: Not on file    Attends meetings of clubs or organizations: Not on file    Relationship status: Not on file  Other Topics Concern  . Not on file  Social History Narrative  . Not on file     Family History: The patient's family history includes Heart attack in his father. ROS:   Please see the history of present illness.    All 14 point review of systems negative except as described per history of present illness  EKGs/Labs/Other Studies Reviewed:      Recent Labs: 10/14/2017: ALT 55 10/27/2017: BUN 13; Creatinine, Ser 0.98; Potassium 5.0; Sodium 134  Recent Lipid Panel    Component Value Date/Time   CHOL 124 10/14/2017 1014   TRIG 67 10/14/2017 1014   HDL 35 (L) 10/14/2017 1014   CHOLHDL 3.5 10/14/2017 1014   LDLCALC 76 10/14/2017 1014    Physical Exam:    VS:  BP 110/78   Pulse 82   Wt 128 lb 6.4 oz (58.2 kg)   SpO2 98%   BMI 25.08 kg/m     Wt Readings from Last 3 Encounters:  02/17/18 128 lb 6.4 oz (58.2 kg)  10/14/17 127 lb 6.4 oz (57.8 kg)  04/30/11 150 lb (68 kg)     GEN:  Well nourished, well developed in no acute distress HEENT: Normal NECK: No JVD; No carotid bruits LYMPHATICS: No lymphadenopathy CARDIAC: RRR, no murmurs, no rubs, no gallops RESPIRATORY:  Clear to auscultation without rales, wheezing or rhonchi  ABDOMEN: Soft, non-tender, non-distended MUSCULOSKELETAL:  No edema; No deformity  SKIN: Warm and dry LOWER EXTREMITIES: no swelling NEUROLOGIC:  Alert and oriented x 3 PSYCHIATRIC:  Normal affect   ASSESSMENT:    1. Ischemic cardiomyopathy   2. Chronic systolic congestive heart failure ([place])   3. Dyslipidemia   4. Implantable cardioverter-defibrillator (ICD) in situ    PLAN:    In order of problems listed above:  1. Ischemic cardiomyopathy on beta-blocker and ACE inhibitor stable we will continue present management. 2. Chronic systolic congestive heart failure compensated.  [place] Heart Association class I. 3. Dyslipidemia we will check his fasting lipid profile today. 4. Implantable cardiac defibrillator.  We will schedule him to see our pacemaker clinic. 5. Smoking: Obviously problem he wanted me to give him prescription for Wellbutrin will get his EKG first. 6. He is on dual antiplatelet therapy he has been like this for many years I will continue.   Medication Adjustments/Labs and Tests Ordered:  Current medicines are reviewed at length with the patient today.  Concerns regarding medicines are outlined above.  No orders of the defined types were placed in this encounter.  Medication changes: No orders of the defined types were placed in this encounter.   Signed, Park Liter, MD, [facility] 02/17/2018 9:27 AM    [place]

## 2018-02-17 NOTE — Telephone Encounter (Signed)
Informed patient of upcoming appointment to see Dr. Curt Bears per Dr. Agustin Cree.

## 2018-02-17 NOTE — Patient Instructions (Signed)
Medication Instructions:  Your physician has recommended you make the following change in your medication:  Start: Wellbutrin 150 mg daily    If you need a refill on your cardiac medications before your next appointment, please call your pharmacy.   Lab work: Your physician recommends that you return for lab work today: Lipids  If you have labs (blood work) drawn today and your tests are completely normal, you will receive your results only by: Marland Kitchen MyChart Message (if you have MyChart) OR . A paper copy in the mail If you have any lab test that is abnormal or we need to change your treatment, we will call you to review the results.  Testing/Procedures: None.   Follow-Up: At [facility], you and your health needs are our priority.  As part of our continuing mission to provide you with exceptional heart care, we have created designated Provider Care Teams.  These Care Teams include your primary Cardiologist (physician) and Advanced Practice Providers (APPs -  Physician Assistants and Nurse Practitioners) who all work together to provide you with the care you need, when you need it. You will need a follow up appointment in 5 months.  Please call our office 2 months in advance to schedule this appointment.  You may see No primary care provider on file. or another member of our [employer] Provider Team in [place]: Shirlee More, MD . Jyl Heinz, MD  Any Other Special Instructions Will Be Listed Below (If Applicable).  Bupropion tablets (Depression/Mood Disorders) What is this medicine? BUPROPION (byoo PROE pee on) is used to treat depression. This medicine may be used for other purposes; ask your health care provider or pharmacist if you have questions. COMMON BRAND NAME(S): Wellbutrin What should I tell my health care provider before I take this medicine? They need to know if you have any of these conditions: -an eating disorder, such as anorexia or bulimia -bipolar disorder or  psychosis -diabetes or high blood sugar, treated with medication -glaucoma -heart disease, previous heart attack, or irregular heart beat -head injury or brain tumor -high blood pressure -kidney or liver disease -seizures -suicidal thoughts or a previous suicide attempt -Tourette's syndrome -weight loss -an unusual or allergic reaction to bupropion, other medicines, foods, dyes, or preservatives -breast-feeding -pregnant or trying to become pregnant How should I use this medicine? Take this medicine by mouth with a glass of water. Follow the directions on the prescription label. You can take it with or without food. If it upsets your stomach, take it with food. Take your medicine at regular intervals. Do not take your medicine more often than directed. Do not stop taking this medicine suddenly except upon the advice of your doctor. Stopping this medicine too quickly may cause serious side effects or your condition may worsen. A special MedGuide will be given to you by the pharmacist with each prescription and refill. Be sure to read this information carefully each time. Talk to your pediatrician regarding the use of this medicine in children. Special care may be needed. Overdosage: If you think you have taken too much of this medicine contact a poison control center or emergency room at once. NOTE: This medicine is only for you. Do not share this medicine with others. What if I miss a dose? If you miss a dose, take it as soon as you can. If it is less than four hours to your next dose, take only that dose and skip the missed dose. Do not take double or  extra doses. What may interact with this medicine? Do not take this medicine with any of the following medications: -linezolid -MAOIs like Azilect, Carbex, Eldepryl, Marplan, Nardil, and Parnate -methylene blue (injected into a vein) -other medicines that contain bupropion like Zyban This medicine may also interact with the following  medications: -alcohol -certain medicines for anxiety or sleep -certain medicines for blood pressure like metoprolol, propranolol -certain medicines for depression or psychotic disturbances -certain medicines for HIV or AIDS like efavirenz, lopinavir, nelfinavir, ritonavir -certain medicines for irregular heart beat like propafenone, flecainide -certain medicines for Parkinson's disease like amantadine, levodopa -certain medicines for seizures like carbamazepine, phenytoin, phenobarbital -cimetidine -clopidogrel -cyclophosphamide -digoxin -furazolidone -isoniazid -nicotine -orphenadrine -procarbazine -steroid medicines like prednisone or cortisone -stimulant medicines for attention disorders, weight loss, or to stay awake -tamoxifen -theophylline -thiotepa -ticlopidine -tramadol -warfarin This list may not describe all possible interactions. Give your health care provider a list of all the medicines, herbs, non-prescription drugs, or dietary supplements you use. Also tell them if you smoke, drink alcohol, or use illegal drugs. Some items may interact with your medicine. What should I watch for while using this medicine? Tell your doctor if your symptoms do not get better or if they get worse. Visit your doctor or health care professional for regular checks on your progress. Because it may take several weeks to see the full effects of this medicine, it is important to continue your treatment as prescribed by your doctor. Patients and their families should watch out for new or worsening thoughts of suicide or depression. Also watch out for sudden changes in feelings such as feeling anxious, agitated, panicky, irritable, hostile, aggressive, impulsive, severely restless, overly excited and hyperactive, or not being able to sleep. If this happens, especially at the beginning of treatment or after a change in dose, call your health care professional. Avoid alcoholic drinks while taking this  medicine. Drinking excessive alcoholic beverages, using sleeping or anxiety medicines, or quickly stopping the use of these agents while taking this medicine may increase your risk for a seizure. Do not drive or use heavy machinery until you know how this medicine affects you. This medicine can impair your ability to perform these tasks. Do not take this medicine close to bedtime. It may prevent you from sleeping. Your mouth may get dry. Chewing sugarless gum or sucking hard candy, and drinking plenty of water may help. Contact your doctor if the problem does not go away or is severe. What side effects may I notice from receiving this medicine? Side effects that you should report to your doctor or health care professional as soon as possible: -allergic reactions like skin rash, itching or hives, swelling of the face, lips, or tongue -breathing problems -changes in vision -confusion -elevated mood, decreased need for sleep, racing thoughts, impulsive behavior -fast or irregular heartbeat -hallucinations, loss of contact with reality -increased blood pressure -redness, blistering, peeling or loosening of the skin, including inside the mouth -seizures -suicidal thoughts or other mood changes -unusually weak or tired -vomiting Side effects that usually do not require medical attention (report to your doctor or health care professional if they continue or are bothersome): -constipation -headache -loss of appetite -nausea -tremors -weight loss This list may not describe all possible side effects. Call your doctor for medical advice about side effects. You may report side effects to FDA at 1-800-FDA-1088. Where should I keep my medicine? Keep out of the reach of children. Store at room temperature between 20 and 25 degrees  C (68 and 77 degrees F), away from direct sunlight and moisture. Keep tightly closed. Throw away any unused medicine after the expiration date. NOTE: This sheet is a  summary. It may not cover all possible information. If you have questions about this medicine, talk to your doctor, pharmacist, or health care provider.  2019 Elsevier/Gold Standard (2015-08-02 13:44:21)

## 2018-02-21 ENCOUNTER — Telehealth: Payer: Self-pay | Admitting: Emergency Medicine

## 2018-02-21 DIAGNOSIS — E785 Hyperlipidemia, unspecified: Secondary | ICD-10-CM

## 2018-02-21 MED ORDER — ATORVASTATIN CALCIUM 40 MG PO TABS: 40.0000 mg | ORAL_TABLET | ORAL | 1 refills | Status: DC

## 2018-02-21 MED ORDER — ATORVASTATIN CALCIUM 20 MG PO TABS: 20.0000 mg | ORAL_TABLET | ORAL | 1 refills | Status: DC

## 2018-02-21 NOTE — Addendum Note (Signed)
Addended by: Ashok Norris on: 02/21/2018 04:40 PM   Modules accepted: Orders

## 2018-02-21 NOTE — Telephone Encounter (Signed)
Patient reports taking 20 mg daily already of lipitor. Dr. Agustin Cree aware and he advised patient to increase to 40 mg daily instead. Patient verbally understands.

## 2018-02-21 NOTE — Telephone Encounter (Signed)
Patient informed of lab results. Patient informed to increase lipitor to 20 mg daily and has labs redrawn in 6 weeks. Patient verbally understands.

## 2018-04-04 ENCOUNTER — Encounter: Payer: Self-pay | Admitting: Cardiology

## 2018-04-04 ENCOUNTER — Ambulatory Visit (INDEPENDENT_AMBULATORY_CARE_PROVIDER_SITE_OTHER): Payer: BLUE CROSS/BLUE SHIELD | Admitting: Cardiology

## 2018-04-04 VITALS — BP 124/66 | HR 80 | Ht 66.0 in | Wt 130.0 lb

## 2018-04-04 DIAGNOSIS — I255 Ischemic cardiomyopathy: Secondary | ICD-10-CM

## 2018-04-04 NOTE — Progress Notes (Signed)
Electrophysiology Office Note   Date:  04/04/2018   ID:  [name], [name] [birth year], MRN [id number]  PCP:  [name] Peer, MD  Cardiologist:  Agustin Cree Primary Electrophysiologist:  [name] Meredith Leeds, MD    No chief complaint on file.    History of Present Illness: [name] is a 63 y.o. male who is being seen today for the evaluation of ischemic cardiomyopathy at the request of Park Liter, MD. Presenting today for electrophysiology evaluation.  Has a history of coronary artery disease, hypertension, hyperlipidemia, ischemic cardiomyopathy status post Medtronic ICD.  Device was implanted in 2005.  He had a generator change with an RV lead addition and has a capped 6949-lead.   Today, he denies symptoms of palpitations, chest pain, shortness of breath, orthopnea, PND, lower extremity edema, claudication, dizziness, presyncope, syncope, bleeding, or neurologic sequela. The patient is tolerating medications without difficulties.  Is overall feeling well.  No complaints at this time.   Past Medical History:  Diagnosis Date  . 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 CAD (coronary artery disease)    s/p anterior wall MI 2005 with VF arrest at tha ttime s/p stent of prox LAD, with repeat intervention at that time for ruptured plque in RCA which had cleared so significantly that PCI was no longer contemplated   . High cholesterol   . Hypertension   . Ischemic cardiomyopathy    EF 30-35% s/p ICD placement 2005. 6949 lead malfunction/device ERI s/p new lead & generator change (Medtronic) 04/2011.   Past Surgical History:  Procedure Laterality Date  . APPENDECTOMY  ~ 1974  . CORONARY ANGIOPLASTY WITH STENT PLACEMENT  2005  . ICD placement  2005  . ICD replaced  04/30/11   ICD lead replaced; old ICD removed; new ICD placed  . LEAD REVISION N/A 04/30/2011   Procedure: LEAD REVISION;  Surgeon: Evans Lance, MD;  Location: [facility] CATH LAB;  Service:  Cardiovascular;  Laterality: N/A;  . NASAL SEPTUM SURGERY  1988     Current Outpatient Medications  Medication Sig Dispense Refill  . aspirin 81 MG tablet Take 81 mg by mouth daily.    Marland Kitchen atorvastatin (LIPITOR) 40 MG tablet Take 1 tablet (40 mg total) by mouth every morning. 90 tablet 1  . buPROPion (WELLBUTRIN SR) 150 MG 12 hr tablet Take 1 tablet (150 mg total) by mouth daily. 90 tablet 1  . carvedilol (COREG) 25 MG tablet Take 25 mg by mouth 2 (two) times daily with a meal.    . clopidogrel (PLAVIX) 75 MG tablet Take 75 mg by mouth every morning.    . Coenzyme Q10 (COQ10 PO) Take 1 capsule by mouth every morning.    Marland Kitchen lisinopril (PRINIVIL,ZESTRIL) 20 MG tablet Take 20 mg by mouth every morning.    . nitroGLYCERIN (NITROSTAT) 0.4 MG SL tablet Place 1 tablet under the tongue every 5 (five) minutes as needed.    . Omega-3 Fatty Acids (FISH OIL PO) Take 1 capsule by mouth 2 (two) times daily.    . Red Yeast Rice Extract (RED YEAST RICE PO) Take 1 capsule by mouth 2 (two) times daily.    . hydrochlorothiazide (HYDRODIURIL) 25 MG tablet Take 0.5 tablets (12.5 mg total) by mouth daily. 60 tablet 1   No current facility-administered medications for this visit.     Allergies:   Spironolactone; Sulfasalazine; Sulfonamide derivatives; and Codeine   Social History:  The patient  reports  that he has been smoking cigarettes. He has a 7.50 pack-year smoking history. He has never used smokeless tobacco. He reports current alcohol use. He reports that he does not use drugs.   Family History:  The patient's family history includes Heart attack in his father.    ROS:  Please see the history of present illness.   Otherwise, review of systems is positive for none.   All other systems are reviewed and negative.    PHYSICAL EXAM: VS:  BP 124/66   Pulse 80   Ht 5\' 6"  (1.676 m)   Wt 130 lb (59 kg)   BMI 20.98 kg/m  , BMI Body mass index is 20.98 kg/m. GEN: Well nourished, well developed, in no acute  distress  HEENT: normal  Neck: no JVD, carotid bruits, or masses Cardiac: RRR; no murmurs, rubs, or gallops,no edema  Respiratory:  clear to auscultation bilaterally, normal work of breathing GI: soft, nontender, nondistended, + BS MS: no deformity or atrophy  Skin: warm and dry, device pocket is well healed Neuro:  Strength and sensation are intact Psych: euthymic mood, full affect  EKG:  EKG is not ordered today. Personal review of the ekg ordered 02/17/18 shows SR, anterolateral infarct  Device interrogation is reviewed today in detail.  See PaceArt for details.   Recent Labs: 10/14/2017: ALT 55 10/27/2017: BUN 13; Creatinine, Ser 0.98; Potassium 5.0; Sodium 134    Lipid Panel     Component Value Date/Time   CHOL 133 02/17/2018 0944   TRIG 101 02/17/2018 0944   HDL 33 (L) 02/17/2018 0944   CHOLHDL 4.0 02/17/2018 0944   LDLCALC 80 02/17/2018 0944     Wt Readings from Last 3 Encounters:  04/04/18 130 lb (59 kg)  02/17/18 128 lb 6.4 oz (58.2 kg)  10/14/17 127 lb 6.4 oz (57.8 kg)      Other studies Reviewed: Additional studies/ records that were reviewed today include: TTE 12/14/17  Review of the above records today demonstrates:  - Left ventricle: The cavity size was normal. Wall thickness was   normal. Systolic function was normal. Akinesis of the   mid-apicalanteroseptal, anterior, inferoseptal, and apical   myocardium. Doppler parameters are consistent with abnormal left   ventricular relaxation (grade 1 diastolic dysfunction). - Aortic valve: There was trivial regurgitation.   ASSESSMENT AND PLAN:  1.  Ischemic cardiomyopathy: Status post Medtronic ICD.  Currently on optimal medical therapy.  Has a capped 6949-lead.  Is functioning appropriately.  He is currently trending up on his fluid level, but he is also on steroids.  [name] allow him to come off of those prior to making medication adjustments.  2.  Coronary artery disease: No current chest pain  3.   Hyperlipidemia: Continue Lipitor with a goal LDL of 70  4.  Tobacco abuse: Cessation encouraged    Current medicines are reviewed at length with the patient today.   The patient does not have concerns regarding his medicines.  The following changes were made today:  none  Labs/ tests ordered today include:  No orders of the defined types were placed in this encounter.    Disposition:   FU with [name] Camnitz 1 year  Signed, [name] Meredith Leeds, MD  04/04/2018 12:25 PM     [place] [address] [place] [address] [address] [phone] (office) [phone] (fax)

## 2018-04-04 NOTE — Patient Instructions (Addendum)
Medication Instructions:  Your physician recommends that you continue on your current medications as directed. Please refer to the Current Medication list given to you today.  *If you need a refill on your cardiac medications before your next appointment, please call your pharmacy*  Labwork: None ordered  Testing/Procedures: None ordered  Follow-Up: Remote monitoring is used to monitor your Pacemaker or ICD from home. This monitoring reduces the number of office visits required to check your device to one time per year. It allows [place] to keep an eye on the functioning of your device to ensure it is working properly. You are scheduled for a device check from home on 07/04/2018. You may send your transmission at any time that day. If you have a wireless device, the transmission will be sent automatically. After your physician reviews your transmission, you will receive a postcard with your next transmission date.  Your physician wants you to follow-up in: 1 year with Dr. Curt Bears.  You will receive a reminder letter in the mail two months in advance. If you don't receive a letter, please call our office to schedule the follow-up appointment.  Thank you for choosing [facility]!!   Trinidad Curet, RN [phone]  Any Other Special Instructions Will Be Listed Below (If Applicable).   Device clinic #  [phone]

## 2018-04-10 LAB — CUP PACEART INCLINIC DEVICE CHECK
Date Time Interrogation Session: 20200210183139
HIGH POWER IMPEDANCE MEASURED VALUE: 285 Ohm
HIGH POWER IMPEDANCE MEASURED VALUE: 69 Ohm
Lead Channel Impedance Value: 399 Ohm
Lead Channel Pacing Threshold Amplitude: 0.75 V
Lead Channel Sensing Intrinsic Amplitude: 11.25 mV
Lead Channel Sensing Intrinsic Amplitude: 13 mV
MDC IDC LEAD IMPLANT DT: 20130307
MDC IDC LEAD LOCATION: 753860
MDC IDC MSMT BATTERY VOLTAGE: 2.97 V
MDC IDC MSMT LEADCHNL RV PACING THRESHOLD PULSEWIDTH: 0.4 ms
MDC IDC PG IMPLANT DT: 20130307
MDC IDC SET LEADCHNL RV PACING AMPLITUDE: 2 V
MDC IDC SET LEADCHNL RV PACING PULSEWIDTH: 0.4 ms
MDC IDC SET LEADCHNL RV SENSING SENSITIVITY: 0.3 mV
MDC IDC STAT BRADY RV PERCENT PACED: 0.01 %

## 2018-04-18 ENCOUNTER — Encounter: Payer: BLUE CROSS/BLUE SHIELD | Admitting: Cardiology

## 2018-06-08 ENCOUNTER — Other Ambulatory Visit: Payer: Self-pay

## 2018-06-08 ENCOUNTER — Telehealth: Payer: Self-pay | Admitting: Cardiology

## 2018-06-08 ENCOUNTER — Encounter: Payer: Self-pay | Admitting: Cardiology

## 2018-06-08 ENCOUNTER — Telehealth (INDEPENDENT_AMBULATORY_CARE_PROVIDER_SITE_OTHER): Payer: BLUE CROSS/BLUE SHIELD | Admitting: Cardiology

## 2018-06-08 VITALS — Wt 130.0 lb

## 2018-06-08 DIAGNOSIS — I255 Ischemic cardiomyopathy: Secondary | ICD-10-CM

## 2018-06-08 DIAGNOSIS — I5022 Chronic systolic (congestive) heart failure: Secondary | ICD-10-CM

## 2018-06-08 DIAGNOSIS — Z95810 Presence of automatic (implantable) cardiac defibrillator: Secondary | ICD-10-CM

## 2018-06-08 DIAGNOSIS — I252 Old myocardial infarction: Secondary | ICD-10-CM

## 2018-06-08 DIAGNOSIS — E785 Hyperlipidemia, unspecified: Secondary | ICD-10-CM

## 2018-06-08 DIAGNOSIS — Z955 Presence of coronary angioplasty implant and graft: Secondary | ICD-10-CM

## 2018-06-08 MED ORDER — SACUBITRIL-VALSARTAN 24-26 MG PO TABS: 1.0000 | ORAL_TABLET | Freq: Two times a day (BID) | ORAL | 1 refills | Status: DC

## 2018-06-08 NOTE — Patient Instructions (Signed)
Medication Instructions:  Your physician has recommended you make the following change in your medication:  STOP: Lisinopril for 2 days.   Start Entresto 24/26 mg twice daily on Saturday 06/11/2018  If you need a refill on your cardiac medications before your next appointment, please call your pharmacy.   Lab work: None.   If you have labs (blood work) drawn today and your tests are completely normal, you will receive your results only by: Marland Kitchen MyChart Message (if you have MyChart) OR . A paper copy in the mail If you have any lab test that is abnormal or we need to change your treatment, we will call you to review the results.  Testing/Procedures: None   Follow-Up: At [facility], you and your health needs are our priority.  As part of our continuing mission to provide you with exceptional heart care, we have created designated Provider Care Teams.  These Care Teams include your primary Cardiologist (physician) and Advanced Practice Providers (APPs -  Physician Assistants and Nurse Practitioners) who all work together to provide you with the care you need, when you need it. You will need a follow up appointment in 1 weeks.  Please call our office 2 months in advance to schedule this appointment.  You may see No primary care provider on file. or another member of our [employer] Provider Team in [place]: Shirlee More, MD . Jyl Heinz, MD  Any Other Special Instructions Will Be Listed Below (If Applicable).  Sacubitril; Valsartan oral tablet What is this medicine? SACUBITRIL; VALSARTAN (sak UE bi tril; val SAR tan) is a combination of 2 drugs used to reduce the risk of death and hospitalizations in people with long-lasting heart failure. It is usually used with other medicines to treat heart failure. This medicine may be used for other purposes; ask your health care provider or pharmacist if you have questions. COMMON BRAND NAME(S): Entresto What should I tell my health care  provider before I take this medicine? They need to know if you have any of these conditions: -diabetes and take a medicine that contains aliskiren -kidney disease -liver disease -an unusual or allergic reaction to sacubitril; valsartan, drugs called angiotensin converting enzyme (ACE) inhibitors, angiotensin II receptor blockers (ARBs), other medicines, foods, dyes, or preservatives -pregnant or trying to get pregnant -breast-feeding How should I use this medicine? Take this medicine by mouth with a glass of water. Follow the directions on the prescription label. You can take it with or without food. If it upsets your stomach, take it with food. Take your medicine at regular intervals. Do not take it more often than directed. Do not stop taking except on your doctor's advice. Do not take this medicine for at least 36 hours before or after you take an ACE inhibitor medicine. Talk to your health care provider if you are not sure if you take an ACE inhibitor. Talk to your pediatrician regarding the use of this medicine in children. Special care may be needed. Overdosage: If you think you have taken too much of this medicine contact a poison control center or emergency room at once. NOTE: This medicine is only for you. Do not share this medicine with others. What if I miss a dose? If you miss a dose, take it as soon as you can. If it is almost time for next dose, take only that dose. Do not take double or extra doses. What may interact with this medicine? Do not take this medicine with any of  the following medicines: -aliskiren if you have diabetes -angiotensin-converting enzyme (ACE) inhibitors, like benazepril, captopril, enalapril, fosinopril, lisinopril, or ramipril This medicine may also interact with the following medicines: -angiotensin II receptor blockers (ARBs) like azilsartan, candesartan, eprosartan, irbesartan, losartan, olmesartan, telmisartan, or valsartan -lithium -NSAIDS,  medicines for pain and inflammation, like ibuprofen or naproxen -potassium-sparing diuretics like amiloride, spironolactone, and triamterene -potassium supplements This list may not describe all possible interactions. Give your health care provider a list of all the medicines, herbs, non-prescription drugs, or dietary supplements you use. Also tell them if you smoke, drink alcohol, or use illegal drugs. Some items may interact with your medicine. What should I watch for while using this medicine? Tell your doctor or healthcare professional if your symptoms do not start to get better or if they get worse. Do not become pregnant while taking this medicine. Women should inform their doctor if they wish to become pregnant or think they might be pregnant. There is a potential for serious side effects to an unborn child. Talk to your health care professional or pharmacist for more information. You may get dizzy. Do not drive, use machinery, or do anything that needs mental alertness until you know how this medicine affects you. Do not stand or sit up quickly, especially if you are an older patient. This reduces the risk of dizzy or fainting spells. Avoid alcoholic drinks; they can make you more dizzy. What side effects may I notice from receiving this medicine? Side effects that you should report to your doctor or health care professional as soon as possible: -allergic reactions like skin rash, itching or hives, swelling of the face, lips, or tongue -signs and symptoms of increased potassium like muscle weakness; chest pain; or fast, irregular heartbeat -signs and symptoms of kidney injury like trouble passing urine or change in the amount of urine -signs and symptoms of low blood pressure like feeling dizzy or lightheaded, or if you develop extreme fatigue Side effects that usually do not require medical attention (report to your doctor or health care professional if they continue or are  bothersome): -cough This list may not describe all possible side effects. Call your doctor for medical advice about side effects. You may report side effects to FDA at 1-800-FDA-1088. Where should I keep my medicine? Keep out of the reach of children. Store at room temperature between 15 and 30 degrees C (59 and 86 degrees F). Throw away any unused medicine after the expiration date. NOTE: This sheet is a summary. It may not cover all possible information. If you have questions about this medicine, talk to your doctor, pharmacist, or health care provider.  2019 Elsevier/Gold Standard (2015-03-27 13:54:19)

## 2018-06-08 NOTE — Addendum Note (Signed)
Addended by: Ashok Norris on: 06/08/2018 01:39 PM   Modules accepted: Orders

## 2018-06-08 NOTE — Progress Notes (Signed)
Virtual Visit via Video Note   This visit type was conducted due to national recommendations for restrictions regarding the COVID-19 Pandemic (e.g. social distancing) in an effort to limit this patient's exposure and mitigate transmission in our community.  Due to his co-morbid illnesses, this patient is at least at moderate risk for complications without adequate follow up.  This format is felt to be most appropriate for this patient at this time.  All issues noted in this document were discussed and addressed.  A limited physical exam was performed with this format.  Please refer to the patient's chart for his consent to telehealth for [facility].  Evaluation Performed:  Follow-up visit  This visit type was conducted due to national recommendations for restrictions regarding the COVID-19 Pandemic (e.g. social distancing).  This format is felt to be most appropriate for this patient at this time.  All issues noted in this document were discussed and addressed.  No physical exam was performed (except for noted visual exam findings with Video Visits).  Please refer to the patient's chart (MyChart message for video visits and phone note for telephone visits) for the patient's consent to telehealth for [facility].  Date:  06/08/2018  ID: [name], DOB 04/23/1955, MRN [id number]   Patient Location:  [place] [address]   Provider location:   [place] Office  PCP:  Street, Sharon Mt, MD  Cardiologist:  Jenne Campus, MD     Chief Complaint: I am having shortness of breath and cough  History of Present Illness:    NAHEIM BURGEN is a 63 y.o. male  who presents via audio/video conferencing for a telehealth visit today.  History of ischemic cardiomyopathy with severely diminished left ventricular ejection fraction, ICD present, he has been complaining for the last few weeks of cough which is dry, no fever no chills, also some  shortness of breath.  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  There is no swelling of lower extremities there is no weight gain there is no proximal nocturnal dyspnea.   The patient does not have symptoms concerning for COVID-19 infection (fever, chills, cough, or new SHORTNESS OF BREATH).    Prior CV studies:   The following studies were reviewed today:       Past Medical History:  Diagnosis Date  . 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 CAD (coronary artery disease)    s/p anterior wall MI 2005 with VF arrest at tha ttime s/p stent of prox LAD, with repeat intervention at that time for ruptured plque in RCA which had cleared so significantly that PCI was no longer contemplated   . High cholesterol   . Hypertension   . Ischemic cardiomyopathy    EF 30-35% s/p ICD placement 2005. 6949 lead malfunction/device ERI s/p new lead & generator change (Medtronic) 04/2011.    Past Surgical History:  Procedure Laterality Date  . APPENDECTOMY  ~ 1974  . CORONARY ANGIOPLASTY WITH STENT PLACEMENT  2005  . ICD placement  2005  . ICD replaced  04/30/11   ICD lead replaced; old ICD removed; new ICD placed  . LEAD REVISION N/A 04/30/2011   Procedure: LEAD REVISION;  Surgeon: Evans Lance, MD;  [facility];  Service:  Cardiovascular;  Laterality: N/A;  . NASAL SEPTUM SURGERY  1988     Current Meds  Medication Sig  . aspirin 81 MG tablet Take 81 mg by mouth daily.  Marland Kitchen atorvastatin (LIPITOR) 40 MG tablet Take 1 tablet (40 mg total) by mouth every morning.  . carvedilol (COREG) 25 MG tablet Take 25 mg by mouth 2 (two) times daily with a meal.  . clopidogrel (PLAVIX) 75 MG tablet Take 75 mg by mouth every morning.  . Coenzyme Q10 (COQ10 PO) Take 1 capsule by mouth every morning.  . hydrochlorothiazide (HYDRODIURIL) 25 MG  tablet Take 0.5 tablets (12.5 mg total) by mouth daily.  Marland Kitchen lisinopril (PRINIVIL,ZESTRIL) 20 MG tablet Take 20 mg by mouth every morning.  . nitroGLYCERIN (NITROSTAT) 0.4 MG SL tablet Place 1 tablet under the tongue every 5 (five) minutes as needed.  . Omega-3 Fatty Acids (FISH OIL PO) Take 1 capsule by mouth 2 (two) times daily.  . Red Yeast Rice Extract (RED YEAST RICE PO) Take 1 capsule by mouth 2 (two) times daily.      Family History: The patient's family history includes Heart attack in his father.   ROS:   Please see the history of present illness.     All other systems reviewed and are negative.   Labs/Other Tests and Data Reviewed:     Recent Labs: 10/14/2017: ALT 55 10/27/2017: BUN 13; Creatinine, Ser 0.98; Potassium 5.0; Sodium 134  Recent Lipid Panel    Component Value Date/Time   CHOL 133 02/17/2018 0944   TRIG 101 02/17/2018 0944   HDL 33 (L) 02/17/2018 0944   CHOLHDL 4.0 02/17/2018 0944   LDLCALC 80 02/17/2018 0944      Exam:    Vital Signs:  Wt 130 lb (59 kg)   BMI 20.98 kg/m     Wt Readings from Last 3 Encounters:  06/08/18 130 lb (59 kg)  04/04/18 130 lb (59 kg)  02/17/18 128 lb 6.4 oz (58.2 kg)     Well nourished, well developed male in no acute distress. Alert awake oriented x3.  To be talking to the video link.  He does not have JVD there is no swelling of lower extremities overall he seems to be doing well.  Diagnosis for this visit:   1. Ischemic cardiomyopathy   2. Chronic systolic congestive heart failure ([place])   3. Old MI (myocardial infarction)   4. Dyslipidemia   5. Implantable cardioverter-defibrillator (ICD) in situ   6. H/O heart artery stent      ASSESSMENT & PLAN:    1.  Ischemic cardiomyopathy obviously concern is about potential side effect of lisinopril with his cough however the fact that he use lisinopril so many years already with no difficulty make it less likely.  I still think we can use this opportunity to switch  him from lisinopril to [place].  I will ask him to stop lisinopril for 2 days he does not take Entresto and 2 days later which will be Saturday he will start taking Entresto 24/26 twice daily.  We will contact him the middle of the next week and simply see how he does.  He may required some extra diuretic.  I am worried also that his cough could be related some bronchitis that he may have had however if there is no fever there is no chills.  It may be also allergy to pollen as well.  However, again this is opportunity to switch him to [place] which I  will use. 2.  Congestive heart failure again cough make me worry shortness of breath make me concerned.  We will do as described above. 3.  Old myocardial infarction.  Denies have any chest pain tightness squeezing pressure burning chest. 4.  Dyslipidemia.  Continue statin. 5.  Implantable ICD.  Noted.  Recently seen by EP team stable. 6.  History of stenting.  Stable.  COVID-19 Education: The signs and symptoms of COVID-19 were discussed with the patient and how to seek care for testing (follow up with PCP or arrange E-visit).  the importance of social distancing was discussed today.  Patient Risk:   After full review of this patients clinical status, I feel that they are at least moderate risk at this time.  Time:   Today, I have spent 18 minutes with the patient with telehealth technology discussing pt health issues. Visit was finished at 1:22 PM.    Medication Adjustments/Labs and Tests Ordered: Current medicines are reviewed at length with the patient today.  Concerns regarding medicines are outlined above.  No orders of the defined types were placed in this encounter.  Medication changes: No orders of the defined types were placed in this encounter.    Disposition: Stop lisinopril for 2 days, starting Entresto 24/26 mg twice daily, video visit next week  Signed, Park Liter, MD, [facility] 06/08/2018 1:23 PM    [place]

## 2018-06-08 NOTE — Telephone Encounter (Signed)
TELEPHONE CALL NOTE  [name] has been deemed a candidate for a follow-up tele-health visit to limit community exposure during the Covid-19 pandemic. I spoke with the patient via phone to ensure availability of phone/video source, confirm preferred email & phone number, and discuss instructions and expectations.  I reminded [name] to be prepared with any vital sign and/or heart rhythm information that could potentially be obtained via home monitoring, at the time of his visit. I reminded [name] to expect a phone call at the time of his visit if his visit.  Janine Limbo 06/08/2018 9:54 AM   IFULL LENGTH CONSENT FOR TELE-HEALTH VISIT   I hereby voluntarily request, consent and authorize [facility] and its employed or contracted physicians, physician assistants, nurse practitioners or other licensed health care professionals (the Practitioner), to provide me with telemedicine health care services (the Services") as deemed necessary by the treating Practitioner. I acknowledge and consent to receive the Services by the Practitioner via telemedicine. I understand that the telemedicine visit will involve communicating with the Practitioner through live audiovisual communication technology and the disclosure of certain medical information by electronic transmission. I acknowledge that I have been given the opportunity to request an in-person assessment or other available alternative prior to the telemedicine visit and am voluntarily participating in the telemedicine visit.  I understand that I have the right to withhold or withdraw my consent to the use of telemedicine in the course of my care at any time, without affecting my right to future care or treatment, and that the Practitioner or I may terminate the telemedicine visit at any time. I understand that I have the right to inspect all information obtained and/or recorded in the course of the telemedicine  visit and may receive copies of available information for a reasonable fee.  I understand that some of the potential risks of receiving the Services via telemedicine include:   Delay or interruption in medical evaluation due to technological equipment failure or disruption;  Information transmitted may not be sufficient (e.g. poor resolution of images) to allow for appropriate medical decision making by the Practitioner; and/or   In rare instances, security protocols could fail, causing a breach of personal health information.  Furthermore, I acknowledge that it is my responsibility to provide information about my medical history, conditions and care that is complete and accurate to the best of my ability. I acknowledge that Practitioner's advice, recommendations, and/or decision may be based on factors not within their control, such as incomplete or inaccurate data provided by me or distortions of diagnostic images or specimens that may result from electronic transmissions. I understand that the practice of medicine is not an exact science and that Practitioner makes no warranties or guarantees regarding treatment outcomes. I acknowledge that I will receive a copy of this consent concurrently upon execution via email to the email address I last provided but may also request a printed copy by calling the office of [place].    I understand that my insurance will be billed for this visit.   I have read or had this consent read to me.  I understand the contents of this consent, which adequately explains the benefits and risks of the Services being provided via telemedicine.   I have been provided ample opportunity to ask questions regarding this consent and the Services and have had my questions answered to my satisfaction.  I give my informed consent for the services  to be provided through the use of telemedicine in my medical care  By participating in this telemedicine visit I agree to the  above.  YES      Cardiac Questionnaire:    Since your last visit or hospitalization:    1. Have you been having new or worsening chest pain? No   2. Have you been having new or worsening shortness of breath? YES   3. Have you been having new or worsening leg swelling, wt gain, or increase in abdominal girth (pants fitting more tightly)? NO   4. Have you had any passing out spells? NO    *A YES to any of these questions would result in the appointment being kept. *If all the answers to these questions are NO, we should indicate that given the current situation regarding the worldwide coronarvirus pandemic, at the recommendation of the CDC, we are looking to limit gatherings in our waiting area, and thus will reschedule their appointment beyond four weeks from today.   _____________   COVID-19 Pre-Screening Questions:   Do you currently have a fever? NO  Have you recently travelled on a cruise, internationally, or to [place], [place], [place], [place], [place], or [place], [place] [employer]) ? NO  Have you been in contact with someone that is currently pending confirmation of Covid19 testing or has been confirmed to have the [place] virus? NO Are you currently experiencing fatigue or cough?YES   Made RN aware; televisit is scheduled for today

## 2018-06-16 ENCOUNTER — Telehealth: Payer: BLUE CROSS/BLUE SHIELD | Admitting: Cardiology

## 2018-06-27 ENCOUNTER — Other Ambulatory Visit: Payer: Self-pay

## 2018-06-27 MED ORDER — HYDROCHLOROTHIAZIDE 25 MG PO TABS: 12.5000 mg | ORAL_TABLET | Freq: Every day | ORAL | 1 refills | Status: DC

## 2018-06-28 ENCOUNTER — Other Ambulatory Visit: Payer: Self-pay

## 2018-06-28 ENCOUNTER — Telehealth (INDEPENDENT_AMBULATORY_CARE_PROVIDER_SITE_OTHER): Payer: BLUE CROSS/BLUE SHIELD | Admitting: Cardiology

## 2018-06-28 VITALS — Wt 125.0 lb

## 2018-06-28 DIAGNOSIS — E785 Hyperlipidemia, unspecified: Secondary | ICD-10-CM

## 2018-06-28 DIAGNOSIS — I255 Ischemic cardiomyopathy: Secondary | ICD-10-CM

## 2018-06-28 DIAGNOSIS — Z95810 Presence of automatic (implantable) cardiac defibrillator: Secondary | ICD-10-CM

## 2018-06-28 DIAGNOSIS — I5022 Chronic systolic (congestive) heart failure: Secondary | ICD-10-CM

## 2018-06-28 NOTE — Patient Instructions (Signed)
Medication Instructions:  Your physician recommends that you continue on your current medications as directed. Please refer to the Current Medication list given to you today.  If you need a refill on your cardiac medications before your next appointment, please call your pharmacy.   Lab work: None.  If you have labs (blood work) drawn today and your tests are completely normal, you will receive your results only by: . MyChart Message (if you have MyChart) OR . A paper copy in the mail If you have any lab test that is abnormal or we need to change your treatment, we will call you to review the results.  Testing/Procedures: None   Follow-Up: At [facility], you and your health needs are our priority.  As part of our continuing mission to provide you with exceptional heart care, we have created designated Provider Care Teams.  These Care Teams include your primary Cardiologist (physician) and Advanced Practice Providers (APPs -  Physician Assistants and Nurse Practitioners) who all work together to provide you with the care you need, when you need it. You will need a follow up appointment in 2 months.  Please call our office 2 months in advance to schedule this appointment.  You may see No primary care provider on file. or another member of our [facility] Provider Team in [place]: Brian Munley, MD . Rajan Revankar, MD  Any Other Special Instructions Will Be Listed Below (If Applicable).    

## 2018-06-28 NOTE — Progress Notes (Signed)
Virtual Visit via Telephone Note   This visit type was conducted due to national recommendations for restrictions regarding the COVID-19 Pandemic (e.g. social distancing) in an effort to limit this patient's exposure and mitigate transmission in our community.  Due to his co-morbid illnesses, this patient is at least at moderate risk for complications without adequate follow up.  This format is felt to be most appropriate for this patient at this time.  The patient did not have access to video technology/had technical difficulties with video requiring transitioning to audio format only (telephone).  All issues noted in this document were discussed and addressed.  No physical exam could be performed with this format.  Please refer to the patient's chart for his  consent to telehealth for [facility].  Evaluation Performed:  Follow-up visit  This visit type was conducted due to national recommendations for restrictions regarding the COVID-19 Pandemic (e.g. social distancing).  This format is felt to be most appropriate for this patient at this time.  All issues noted in this document were discussed and addressed.  No physical exam was performed (except for noted visual exam findings with Video Visits).  Please refer to the patient's chart (MyChart message for video visits and phone note for telephone visits) for the patient's consent to telehealth for [facility].  Date:  06/28/2018  ID: [name], DOB [birth year], MRN [id number]   Patient Location: [place] [address]   Provider location:   [place] Office  PCP:  Street, Sharon Mt, MD  Cardiologist:  Jenne Campus, MD     Chief Complaint: Doing better  History of Present Illness:    [name] is a 63 y.o. male  who presents via audio/video conferencing for a telehealth visit today.  With coronary artery disease, ischemic cardiomyopathy, ICD, dyslipidemia last time I spoke to  him he was complaining having cough and some shortness of breath.  There was concern about him potentially having a reaction to lisinopril, therefore, we will switch him from lisinopril to [employer] should be better for his clinical scenario therefore we use this opportunity to upgrade his medications.  Overall he is doing much better.  Cough is gone he got his energy back he also was given some nebulizer which helps with his breathing and overall doing better.  Denies having any chest pain, tightness, pressure, burning, squeezing in the chest.   The patient does not have symptoms concerning for COVID-19 infection (fever, chills, cough, or new SHORTNESS OF BREATH).    Prior CV studies:   The following studies were reviewed today:  .     Past Medical History:  Diagnosis Date  . 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 CAD (coronary artery disease)    s/p anterior wall MI 2005 with VF arrest at tha ttime s/p stent of prox LAD, with repeat intervention at that time for ruptured plque in RCA which had cleared so significantly that PCI was no longer contemplated   . High cholesterol   . Hypertension   . Ischemic cardiomyopathy    EF 30-35% s/p ICD placement 2005. 6949 lead malfunction/device ERI s/p new lead & generator change (Medtronic) 04/2011.    Past Surgical History:  Procedure Laterality Date  . APPENDECTOMY  ~ 1974  . CORONARY ANGIOPLASTY WITH STENT PLACEMENT  2005  . ICD placement  2005  . ICD replaced  04/30/11   ICD lead replaced;  old ICD removed; new ICD placed  . LEAD REVISION N/A 04/30/2011   Procedure: LEAD REVISION;  Surgeon: Evans Lance, MD;  Location: [facility] CATH LAB;  Service: Cardiovascular;  Laterality: N/A;  . NASAL SEPTUM SURGERY  1988     Current Meds  Medication Sig  . aspirin 81 MG tablet Take 81 mg by mouth daily.  Marland Kitchen atorvastatin (LIPITOR) 40 MG tablet Take 1 tablet (40 mg total) by mouth every morning.  . carvedilol (COREG) 25 MG  tablet Take 25 mg by mouth 2 (two) times daily with a meal.  . clopidogrel (PLAVIX) 75 MG tablet Take 75 mg by mouth every morning.  . Coenzyme Q10 (COQ10 PO) Take 1 capsule by mouth every morning.  . Fluticasone-Umeclidin-Vilant (TRELEGY ELLIPTA) 100-62.5-25 MCG/INH AEPB Inhale into the lungs.  . hydrochlorothiazide (HYDRODIURIL) 25 MG tablet Take 0.5 tablets (12.5 mg total) by mouth daily.  . nitroGLYCERIN (NITROSTAT) 0.4 MG SL tablet Place 1 tablet under the tongue every 5 (five) minutes as needed.  . Omega-3 Fatty Acids (FISH OIL PO) Take 1 capsule by mouth 2 (two) times daily.  . Red Yeast Rice Extract (RED YEAST RICE PO) Take 1 capsule by mouth 2 (two) times daily.  . sacubitril-valsartan (ENTRESTO) 24-26 MG Take 1 tablet by mouth 2 (two) times daily.      Family History: The patient's family history includes Heart attack in his father.   ROS:   Please see the history of present illness.     All other systems reviewed and are negative.   Labs/Other Tests and Data Reviewed:     Recent Labs: 10/14/2017: ALT 55 10/27/2017: BUN 13; Creatinine, Ser 0.98; Potassium 5.0; Sodium 134  Recent Lipid Panel    Component Value Date/Time   CHOL 133 02/17/2018 0944   TRIG 101 02/17/2018 0944   HDL 33 (L) 02/17/2018 0944   CHOLHDL 4.0 02/17/2018 0944   LDLCALC 80 02/17/2018 0944      Exam:    Vital Signs:  Wt 125 lb (56.7 kg)   BMI 20.18 kg/m     Wt Readings from Last 3 Encounters:  06/28/18 125 lb (56.7 kg)  06/08/18 130 lb (59 kg)  04/04/18 130 lb (59 kg)     Well nourished, well developed in no acute distress. Alert awake oriented x3.  He was unable to establish video link therefore we talked only over the phone but he is happy to be able to talk to me since he is feeling much better.  Diagnosis for this visit:   1. Ischemic cardiomyopathy   2. Chronic systolic congestive heart failure ([place])   3. Implantable cardioverter-defibrillator (ICD) in situ   4. Dyslipidemia       ASSESSMENT & PLAN:    1.  Ischemic cardiomyopathy doing well from that point review on appropriate medications I will continue. 2.  Chronic systolic congestive heart failure compensated. 3.  Implantable cardiac defibrillator.  Present followed by our EP team stable. 4.  Dyslipidemia on statin which I will continue.  COVID-19 Education: The signs and symptoms of COVID-19 were discussed with the patient and how to seek care for testing (follow up with PCP or arrange E-visit).  The importance of social distancing was discussed today.  Patient Risk:   After full review of this patients clinical status, I feel that they are at least moderate risk at this time.  Time:   Today, I have spent 14 minutes with the patient with telehealth technology discussing pt health  issues.  I spent 5 minutes reviewing her chart before the visit.  Visit was finished at 2:02 PM.    Medication Adjustments/Labs and Tests Ordered: Current medicines are reviewed at length with the patient today.  Concerns regarding medicines are outlined above.  No orders of the defined types were placed in this encounter.  Medication changes: No orders of the defined types were placed in this encounter.    Disposition: Follow-up in 2 months  Signed, Park Liter, MD, [facility] 06/28/2018 2:04 PM    [place]

## 2018-07-04 ENCOUNTER — Other Ambulatory Visit: Payer: Self-pay

## 2018-07-04 ENCOUNTER — Ambulatory Visit (INDEPENDENT_AMBULATORY_CARE_PROVIDER_SITE_OTHER): Payer: BLUE CROSS/BLUE SHIELD | Admitting: *Deleted

## 2018-07-04 DIAGNOSIS — I5022 Chronic systolic (congestive) heart failure: Secondary | ICD-10-CM

## 2018-07-04 DIAGNOSIS — I255 Ischemic cardiomyopathy: Secondary | ICD-10-CM | POA: Diagnosis not present

## 2018-07-05 ENCOUNTER — Telehealth: Payer: Self-pay

## 2018-07-05 NOTE — Telephone Encounter (Signed)
Left message for patient to remind of missed remote transmission.  

## 2018-07-06 LAB — CUP PACEART REMOTE DEVICE CHECK
Battery Voltage: 2.93 V
Brady Statistic RV Percent Paced: 0 %
Date Time Interrogation Session: 20200512235248
HighPow Impedance: 304 Ohm
HighPow Impedance: 72 Ohm
Implantable Lead Implant Date: 20130307
Implantable Lead Location: 753860
Implantable Lead Model: 6935
Implantable Pulse Generator Implant Date: 20130307
Lead Channel Impedance Value: 418 Ohm
Lead Channel Pacing Threshold Amplitude: 0.5 V
Lead Channel Pacing Threshold Pulse Width: 0.4 ms
Lead Channel Sensing Intrinsic Amplitude: 11.875 mV
Lead Channel Sensing Intrinsic Amplitude: 11.875 mV
Lead Channel Setting Pacing Amplitude: 2 V
Lead Channel Setting Pacing Pulse Width: 0.4 ms
Lead Channel Setting Sensing Sensitivity: 0.3 mV

## 2018-07-18 DIAGNOSIS — Z95810 Presence of automatic (implantable) cardiac defibrillator: Secondary | ICD-10-CM

## 2018-07-18 DIAGNOSIS — I10 Essential (primary) hypertension: Secondary | ICD-10-CM

## 2018-07-18 DIAGNOSIS — I361 Nonrheumatic tricuspid (valve) insufficiency: Secondary | ICD-10-CM | POA: Diagnosis not present

## 2018-07-18 DIAGNOSIS — R29810 Facial weakness: Secondary | ICD-10-CM

## 2018-07-18 DIAGNOSIS — J449 Chronic obstructive pulmonary disease, unspecified: Secondary | ICD-10-CM

## 2018-07-18 DIAGNOSIS — I252 Old myocardial infarction: Secondary | ICD-10-CM | POA: Diagnosis not present

## 2018-07-18 DIAGNOSIS — E785 Hyperlipidemia, unspecified: Secondary | ICD-10-CM

## 2018-07-18 DIAGNOSIS — I255 Ischemic cardiomyopathy: Secondary | ICD-10-CM

## 2018-07-18 DIAGNOSIS — I639 Cerebral infarction, unspecified: Secondary | ICD-10-CM

## 2018-07-19 DIAGNOSIS — I10 Essential (primary) hypertension: Secondary | ICD-10-CM | POA: Diagnosis not present

## 2018-07-19 DIAGNOSIS — J449 Chronic obstructive pulmonary disease, unspecified: Secondary | ICD-10-CM | POA: Diagnosis not present

## 2018-07-19 DIAGNOSIS — I252 Old myocardial infarction: Secondary | ICD-10-CM | POA: Diagnosis not present

## 2018-07-19 DIAGNOSIS — E785 Hyperlipidemia, unspecified: Secondary | ICD-10-CM | POA: Diagnosis not present

## 2018-07-19 NOTE — Progress Notes (Signed)
Remote ICD transmission.   

## 2018-08-11 ENCOUNTER — Telehealth: Payer: Self-pay | Admitting: *Deleted

## 2018-08-11 MED ORDER — ENTRESTO 24-26 MG PO TABS: 1.0000 | ORAL_TABLET | Freq: Two times a day (BID) | ORAL | 1 refills | Status: DC

## 2018-08-11 NOTE — Telephone Encounter (Signed)
Rx refill sent to pharmacy.  *STAT* If patient is at the pharmacy, call can be transferred to refill team.   1. Which medications need to be refilled? (please list name of each medication and dose if known) Entresto 24-26 bid  2. Which pharmacy/location (including street and city if local pharmacy) is medication to be sent to?[place] they need a 30 day or 90 day supply? [place]

## 2018-08-12 ENCOUNTER — Other Ambulatory Visit: Payer: Self-pay | Admitting: Cardiology

## 2018-09-01 ENCOUNTER — Encounter: Payer: Self-pay | Admitting: Cardiology

## 2018-09-01 ENCOUNTER — Other Ambulatory Visit: Payer: Self-pay

## 2018-09-01 ENCOUNTER — Telehealth (INDEPENDENT_AMBULATORY_CARE_PROVIDER_SITE_OTHER): Payer: BC Managed Care – PPO | Admitting: Cardiology

## 2018-09-01 DIAGNOSIS — Z955 Presence of coronary angioplasty implant and graft: Secondary | ICD-10-CM

## 2018-09-01 DIAGNOSIS — Z95810 Presence of automatic (implantable) cardiac defibrillator: Secondary | ICD-10-CM

## 2018-09-01 DIAGNOSIS — I5022 Chronic systolic (congestive) heart failure: Secondary | ICD-10-CM | POA: Diagnosis not present

## 2018-09-01 DIAGNOSIS — I6930 Unspecified sequelae of cerebral infarction: Secondary | ICD-10-CM

## 2018-09-01 DIAGNOSIS — E785 Hyperlipidemia, unspecified: Secondary | ICD-10-CM

## 2018-09-01 DIAGNOSIS — I252 Old myocardial infarction: Secondary | ICD-10-CM

## 2018-09-01 HISTORY — DX: Unspecified sequelae of cerebral infarction: I69.30

## 2018-09-01 NOTE — Patient Instructions (Signed)

## 2018-09-01 NOTE — Progress Notes (Signed)
Virtual Visit via Telephone Note   This visit type was conducted due to national recommendations for restrictions regarding the COVID-19 Pandemic (e.g. social distancing) in an effort to limit this patient's exposure and mitigate transmission in our community.  Due to his co-morbid illnesses, this patient is at least at moderate risk for complications without adequate follow up.  This format is felt to be most appropriate for this patient at this time.  The patient did not have access to video technology/had technical difficulties with video requiring transitioning to audio format only (telephone).  All issues noted in this document were discussed and addressed.  No physical exam could be performed with this format.  Please refer to the patient's chart for his  consent to telehealth for [facility].  Evaluation Performed:  Follow-up visit  This visit type was conducted due to national recommendations for restrictions regarding the COVID-19 Pandemic (e.g. social distancing).  This format is felt to be most appropriate for this patient at this time.  All issues noted in this document were discussed and addressed.  No physical exam was performed (except for noted visual exam findings with Video Visits).  Please refer to the patient's chart (MyChart message for video visits and phone note for telephone visits) for the patient's consent to telehealth for [facility].  Date:  09/01/2018  ID: [name], DOB 1955/04/02, MRN [id number]   Patient Location: [place] [address]   Provider location:   [place] Office  PCP:  Street, Sharon Mt, MD  Cardiologist:  Jenne Campus, MD     Chief Complaint: I am doing better but I was in a hospital.  History of Present Illness:    [name] is a 63 y.o. male  who presents via audio/video conferencing for a telehealth visit today.  Complex past medical history which include cardiomyopathy which  is ischemic in origin, ICD present, smoking, dyslipidemia.  Recently he ended up going to [facility] because of what appears to be TIA/CVA.  Apparently he noted his mouth drooping on one side.  He spent few days in the hospital quite extensive evaluation has been done nothing is been fine.  He was discharged home.  Overall he is doing well.  Denies have any chest pain tightness squeezing pressure been chest.  Still works and works a lot.  Have no difficulty doing it.   The patient does not have symptoms concerning for COVID-19 infection (fever, chills, cough, or new SHORTNESS OF BREATH).    Prior CV studies:   The following studies were reviewed today:  Recommend hospital reviewed     Past Medical History:  Diagnosis Date  . 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 CAD (coronary artery disease)    s/p anterior wall MI 2005 with VF arrest at tha ttime s/p stent of prox LAD, with repeat intervention at that time for ruptured plque in RCA which had cleared so significantly that PCI was no longer contemplated   . High cholesterol   . Hypertension   . Ischemic cardiomyopathy    EF 30-35% s/p ICD placement 2005. 6949 lead malfunction/device ERI s/p new lead & generator change (Medtronic) 04/2011.    Past Surgical History:  Procedure Laterality Date  . APPENDECTOMY  ~ 1974  . CORONARY ANGIOPLASTY WITH STENT PLACEMENT  2005  . ICD placement  2005  . ICD replaced  04/30/11   ICD lead replaced; old ICD  removed; new ICD placed  . LEAD REVISION N/A 04/30/2011   Procedure: LEAD REVISION;  Surgeon: Evans Lance, MD;  Location: [facility] C[facility] LAB;  Service: Cardiovascular;  Laterality: N/A;  . NASAL SEPTUM SURGERY  1988     Current Meds  Medication Sig  . aspirin 81 MG tablet Take 81 mg by mouth daily.  Marland Kitchen atorvastatin (LIPITOR) 40 MG tablet Take 1 tablet (40 mg total) by mouth every morning.  . carvedilol (COREG) 25 MG tablet Take 25 mg by mouth 2 (two) times daily with a meal.  .  clopidogrel (PLAVIX) 75 MG tablet Take 75 mg by mouth every morning.  . Coenzyme Q10 (COQ10 PO) Take 1 capsule by mouth every morning.  Marland Kitchen ENTRESTO 24-26 MG TAKE 1 TABLET BY MOUTH TWICE DAILY  . Fluticasone-Umeclidin-Vilant (TRELEGY ELLIPTA) 100-62.5-25 MCG/INH AEPB Inhale into the lungs.  . hydrochlorothiazide (HYDRODIURIL) 25 MG tablet Take 0.5 tablets (12.5 mg total) by mouth daily.  . nitroGLYCERIN (NITROSTAT) 0.4 MG SL tablet Place 1 tablet under the tongue every 5 (five) minutes as needed.  . Omega-3 Fatty Acids (FISH OIL PO) Take 1 capsule by mouth 2 (two) times daily.  . Red Yeast Rice Extract (RED YEAST RICE PO) Take 1 capsule by mouth 2 (two) times daily.      Family History: The patient's family history includes Heart attack in his father.   ROS:   Please see the history of present illness.     All other systems reviewed and are negative.   Labs/Other Tests and Data Reviewed:     Recent Labs: 10/14/2017: ALT 55 10/27/2017: BUN 13; Creatinine, Ser 0.98; Potassium 5.0; Sodium 134  Recent Lipid Panel    Component Value Date/Time   CHOL 133 02/17/2018 0944   TRIG 101 02/17/2018 0944   HDL 33 (L) 02/17/2018 0944   CHOLHDL 4.0 02/17/2018 0944   LDLCALC 80 02/17/2018 0944      Exam:    Vital Signs:  There were no vitals taken for this visit.    Wt Readings from Last 3 Encounters:  06/28/18 125 lb (56.7 kg)  06/08/18 130 lb (59 kg)  04/04/18 130 lb (59 kg)     Well nourished, well developed in no acute distress. Alert oriented x3 happy to be able to talk to me over the phone unable to establish video link.  Asymptomatic at the time of my interview  Diagnosis for this visit:   1. Chronic systolic congestive heart failure ([place])   2. Old MI (myocardial infarction)   3. Implantable cardioverter-defibrillator (ICD) in situ   4. Dyslipidemia   5. H/O heart artery stent   6. Late effect of cerebrovascular accident (CVA)      ASSESSMENT & PLAN:    1.  Chronic  systolic congestive heart failure and appropriate medication he complained of price of Entresto which is only $35 for him.  I advised him to continue with those medications and explained to him the role of those medications.  Overall hemodynamically compensated Prior myocardial infarction.  Noted 3.  ICD present interrogation reviewed normal function. 4.  Dyslipidemia we will try to contact primary care physician to get fasting lipid profile.  Will also review records from hospital to look for it. 5.  Late effects of CVA.  Apparently work-up negative.  I stressed importance of quitting smoking spent at least 5 minutes talking about it.  He is already on aspirin Plavix as well as statin which I will continue.  COVID-19  Education: The signs and symptoms of COVID-19 were discussed with the patient and how to seek care for testing (follow up with PCP or arrange E-visit).  The importance of social distancing was discussed today.  Patient Risk:   After full review of this patients clinical status, I feel that they are at least moderate risk at this time.  Time:   Today, I have spent 18 minutes with the patient with telehealth technology discussing pt health issues.  I spent 5 minutes reviewing her chart before the visit.  Visit was finished at 9:17 AM.    Medication Adjustments/Labs and Tests Ordered: Current medicines are reviewed at length with the patient today.  Concerns regarding medicines are outlined above.  No orders of the defined types were placed in this encounter.  Medication changes: No orders of the defined types were placed in this encounter.    Disposition: Follow-up in 3 months.  Signed, Park Liter, MD, [facility] 09/01/2018 9:17 AM    [place]

## 2018-09-14 ENCOUNTER — Telehealth: Payer: Self-pay

## 2018-09-14 NOTE — Telephone Encounter (Signed)
LMOVM to assess if pt is symptomatic. Alert received for increased optivol.

## 2018-09-16 NOTE — Telephone Encounter (Signed)
Spoke with patient. No SHOB , CP , or increased wt gain or edema. Instructed to call office if he has an  Increase in CHF symptoms and ED precautions given.

## 2018-09-19 NOTE — Telephone Encounter (Signed)
Enroll in [facility] clinic. Needs 40 mg lasix daily for the next 3 days.

## 2018-09-20 ENCOUNTER — Other Ambulatory Visit: Payer: Self-pay | Admitting: Cardiology

## 2018-09-20 MED ORDER — FUROSEMIDE 40 MG PO TABS: 40.0000 mg | ORAL_TABLET | Freq: Every day | ORAL | 0 refills | Status: DC

## 2018-09-20 NOTE — Telephone Encounter (Signed)
Informed pt of recommendations.  Pt agreeable. Rx sent to [facility]. Pt aware Margarita Grizzle, RN from [facility] clinic will contact him to go over enrolling him in the clinic. Patient verbalized understanding and agreeable to plan.

## 2018-09-20 NOTE — Telephone Encounter (Signed)
Attempted ICM intro call to patient and no answer.  Left message for call back.

## 2018-09-20 NOTE — Addendum Note (Signed)
Addended by: Stanton Kidney on: 09/20/2018 12:31 PM   Modules accepted: Orders

## 2018-09-26 ENCOUNTER — Ambulatory Visit (INDEPENDENT_AMBULATORY_CARE_PROVIDER_SITE_OTHER): Payer: BC Managed Care – PPO

## 2018-09-26 ENCOUNTER — Other Ambulatory Visit: Payer: Self-pay | Admitting: Cardiology

## 2018-09-26 ENCOUNTER — Telehealth: Payer: Self-pay

## 2018-09-26 DIAGNOSIS — Z95810 Presence of automatic (implantable) cardiac defibrillator: Secondary | ICD-10-CM

## 2018-09-26 DIAGNOSIS — I5022 Chronic systolic (congestive) heart failure: Secondary | ICD-10-CM

## 2018-09-26 MED ORDER — CLOPIDOGREL BISULFATE 75 MG PO TABS: 75.0000 mg | ORAL_TABLET | ORAL | 1 refills | Status: DC

## 2018-09-26 MED ORDER — CARVEDILOL 25 MG PO TABS: 25.0000 mg | ORAL_TABLET | Freq: Two times a day (BID) | ORAL | 1 refills | Status: DC

## 2018-09-26 NOTE — Telephone Encounter (Signed)
Refill sent in

## 2018-09-26 NOTE — Telephone Encounter (Signed)
°*  STAT* If patient is at the pharmacy, call can be transferred to refill team.   1. Which medications need to be refilled? (please list name of each medication and dose if known) Carvedilol, clopidogrel  2. Which pharmacy/location (including street and city if local pharmacy) is medication to be sent to?[facility] on [address] in [place]  3. Do they need a 30 day or 90 day supply? [place]

## 2018-09-26 NOTE — Telephone Encounter (Signed)
Attempted ICM intro call to patient and to request to send remote transmission for review.  Left message with call back number.  Patient referred by Dr Curt Bears after ordering Lasix x 3 days for increased Optivol thoracic impedance.

## 2018-09-27 ENCOUNTER — Telehealth: Payer: Self-pay

## 2018-09-27 NOTE — Telephone Encounter (Signed)
Left message for patient to remind of missed remote transmission.  

## 2018-09-28 NOTE — Progress Notes (Signed)
EPIC Encounter for ICM Monitoring  Patient Name: [name] is a 63 y.o. male Date: 09/28/2018 Primary Care Physican: Street, Sharon Mt, MD Primary Cardiologist: Agustin Cree Electrophysiologist: Curt Bears Weight:  unknown       Attempted ICM referral call intro and left patient message to return call. Transmission reviewed.  Dr Curt Bears ordered lasix x 3 days on 7/24 and referral to ICM.   Optivol thoracic impedance returned close to normal after Lasix was ordered for 3 days only.  Decreased thoracic impedance started 07/12/2018 and fluid index > normal threshold on 07/25/2018.  Prescribed: Furosemide 20 mg 1 tablet x 3 days only.  Is not on a maintenance dosage at this time.   Recommendations: Unable to reach.    Follow-up plan: ICM clinic phone appointment on 11/01/2018.    Copy of ICM check sent to Dr. Curt Bears.   3 month ICM trend: 09/27/2018    1 Year ICM trend:       Rosalene Billings, RN 09/28/2018 2:17 PM

## 2018-09-28 NOTE — Telephone Encounter (Signed)
Attempted ICM referral intro call and Optivol report.  Left message to return call with call back number.

## 2018-10-03 ENCOUNTER — Ambulatory Visit (INDEPENDENT_AMBULATORY_CARE_PROVIDER_SITE_OTHER): Payer: BC Managed Care – PPO | Admitting: *Deleted

## 2018-10-03 DIAGNOSIS — I255 Ischemic cardiomyopathy: Secondary | ICD-10-CM

## 2018-10-03 LAB — CUP PACEART REMOTE DEVICE CHECK
Battery Voltage: 2.91 V
Brady Statistic RV Percent Paced: 0 %
Date Time Interrogation Session: 20200810222039
HighPow Impedance: 285 Ohm
HighPow Impedance: 71 Ohm
Implantable Lead Implant Date: 20130307
Implantable Lead Location: 753860
Implantable Lead Model: 6935
Implantable Pulse Generator Implant Date: 20130307
Lead Channel Impedance Value: 399 Ohm
Lead Channel Pacing Threshold Amplitude: 0.625 V
Lead Channel Pacing Threshold Pulse Width: 0.4 ms
Lead Channel Sensing Intrinsic Amplitude: 9.125 mV
Lead Channel Sensing Intrinsic Amplitude: 9.125 mV
Lead Channel Setting Pacing Amplitude: 2 V
Lead Channel Setting Pacing Pulse Width: 0.4 ms
Lead Channel Setting Sensing Sensitivity: 0.3 mV

## 2018-10-11 ENCOUNTER — Encounter: Payer: Self-pay | Admitting: Cardiology

## 2018-10-11 NOTE — Progress Notes (Signed)
Remote pacemaker transmission.   

## 2018-11-01 ENCOUNTER — Ambulatory Visit (INDEPENDENT_AMBULATORY_CARE_PROVIDER_SITE_OTHER): Payer: BC Managed Care – PPO

## 2018-11-01 DIAGNOSIS — I5022 Chronic systolic (congestive) heart failure: Secondary | ICD-10-CM

## 2018-11-01 DIAGNOSIS — Z95810 Presence of automatic (implantable) cardiac defibrillator: Secondary | ICD-10-CM | POA: Diagnosis not present

## 2018-11-01 NOTE — Progress Notes (Signed)
EPIC Encounter for ICM Monitoring  Patient Name: [name] is a 63 y.o. male Date: 11/01/2018 Primary Care Physican: Street, Sharon Mt, MD Primary Cardiologist: Agustin Cree Electrophysiologist: Curt Bears Weight: unknown       1st ICM remote transmission and attempted intro.  Left message to return call with direct ICM number.  Dr Curt Bears prescribed 3 days of Lasix on 7/28.    Optivol Thoracic impedance suggesting possible fluid accumulation since 10/22/2018.   Prescribed: None, temporary Furosemide dosage given 7/28 x 3 days only  Recommendations: Unable to reach.    Follow-up plan: ICM clinic phone appointment on 11/07/2018 to recheck fluid levels.   91 day device clinic remote transmission 01/02/2019.  Office appt 12/21/2018 with Dr. Agustin Cree.    Copy of ICM check sent to Dr. Curt Bears and Dr Agustin Cree for review of report.   3 month ICM trend: 11/01/2018    1 Year ICM trend:       Rosalene Billings, RN 11/01/2018 1:07 PM

## 2018-11-08 ENCOUNTER — Ambulatory Visit (INDEPENDENT_AMBULATORY_CARE_PROVIDER_SITE_OTHER): Payer: BC Managed Care – PPO

## 2018-11-08 DIAGNOSIS — Z95810 Presence of automatic (implantable) cardiac defibrillator: Secondary | ICD-10-CM

## 2018-11-08 DIAGNOSIS — I5022 Chronic systolic (congestive) heart failure: Secondary | ICD-10-CM

## 2018-11-08 NOTE — Progress Notes (Signed)
EPIC Encounter for ICM Monitoring  Patient Name: [name] is a 63 y.o. male Date: 11/08/2018 Primary Care Physican: Street, Sharon Mt, MD Primary Cardiologist: Agustin Cree Electrophysiologist: Curt Bears Weight: unknown                                                           Attempted ICM referral intro call and unable to reach patient after several attempted.      Optivol Thoracic impedance suggesting possible fluid accumulation since 10/22/2018 but returned to baseline on day of transmission.   Prescribed: Temporary Furosemide dosage given 7/28 x 3 days only.  Hydrochlorothiazide 25 mg Take 0.5 tablets (12.5 mg total) by mouth daily.  Recommendations:  Unable to reach to provide ICM intro.    Follow-up plan: No ICM follow up.   3 month remote transmission monitoring will by device clinic per protocol.   Copy of ICM check sent to Dr. Curt Bears.   3 month ICM trend: 11/07/2018    1 Year ICM trend:       Rosalene Billings, RN 11/08/2018 10:32 AM

## 2018-12-07 ENCOUNTER — Other Ambulatory Visit: Payer: Self-pay | Admitting: Cardiology

## 2018-12-08 ENCOUNTER — Ambulatory Visit: Payer: BC Managed Care – PPO | Admitting: Cardiology

## 2018-12-09 ENCOUNTER — Telehealth: Payer: Self-pay | Admitting: Cardiology

## 2018-12-09 MED ORDER — ENTRESTO 24-26 MG PO TABS: 1.0000 | ORAL_TABLET | Freq: Two times a day (BID) | ORAL | 1 refills | Status: DC

## 2018-12-09 NOTE — Telephone Encounter (Signed)
Entresto 24/26 mg twice daily refilled.  

## 2018-12-09 NOTE — Telephone Encounter (Signed)
Please resend Delene Loll refill to pharmacy. They state they did not receive

## 2018-12-21 ENCOUNTER — Ambulatory Visit (INDEPENDENT_AMBULATORY_CARE_PROVIDER_SITE_OTHER): Payer: BC Managed Care – PPO | Admitting: Cardiology

## 2018-12-21 ENCOUNTER — Other Ambulatory Visit: Payer: Self-pay

## 2018-12-21 ENCOUNTER — Encounter: Payer: Self-pay | Admitting: Cardiology

## 2018-12-21 VITALS — BP 98/62 | HR 73 | Ht 66.0 in | Wt 131.2 lb

## 2018-12-21 DIAGNOSIS — I255 Ischemic cardiomyopathy: Secondary | ICD-10-CM | POA: Diagnosis not present

## 2018-12-21 DIAGNOSIS — I252 Old myocardial infarction: Secondary | ICD-10-CM

## 2018-12-21 DIAGNOSIS — I5022 Chronic systolic (congestive) heart failure: Secondary | ICD-10-CM

## 2018-12-21 DIAGNOSIS — I6930 Unspecified sequelae of cerebral infarction: Secondary | ICD-10-CM

## 2018-12-21 DIAGNOSIS — E785 Hyperlipidemia, unspecified: Secondary | ICD-10-CM

## 2018-12-21 DIAGNOSIS — Z95810 Presence of automatic (implantable) cardiac defibrillator: Secondary | ICD-10-CM

## 2018-12-21 MED ORDER — NITROGLYCERIN 0.4 MG SL SUBL: 0.4000 mg | SUBLINGUAL_TABLET | SUBLINGUAL | 6 refills | Status: DC | PRN

## 2018-12-21 NOTE — Progress Notes (Signed)
Cardiology Office Note:    Date:  12/21/2018   ID:  [name], DOB 11-Oct-1955, MRN [id number]  PCP:  Street, Sharon Mt, MD  Cardiologist:  Jenne Campus, MD    Referring MD: Street, Sharon Mt, *   Chief Complaint  Patient presents with  . Follow-up  Doing well  History of Present Illness:    [name] is a 63 y.o. 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  Still continue to works that aggravated at work but denies have any cardiac symptoms.  Previously we got OptiVol showing reaccumulation of fluid small dose of diuretics have been given he is doing much better.  Past Medical History:  Diagnosis Date  . 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 CAD (coronary artery disease)    s/p anterior wall MI 2005 with VF arrest at tha ttime s/p stent of prox LAD, with repeat intervention at that time for ruptured plque in RCA which had cleared so significantly that PCI was no longer contemplated   . High cholesterol   . Hypertension   . Ischemic cardiomyopathy    EF 30-35% s/p ICD placement 2005. 6949 lead malfunction/device ERI s/p new lead & generator change (Medtronic) 04/2011.    Past Surgical History:  Procedure Laterality Date  . APPENDECTOMY  ~ 1974  . CORONARY ANGIOPLASTY WITH STENT PLACEMENT  2005  . ICD placement  2005  . ICD replaced  04/30/11   ICD lead replaced; old ICD removed; new ICD placed  . LEAD REVISION N/A 04/30/2011   Procedure: LEAD REVISION;  Surgeon: Evans Lance, MD;  Location: [facility] CATH LAB;  Service: Cardiovascular;  Laterality: N/A;  . NASAL SEPTUM SURGERY  1988    Current Medications: Current Meds  Medication Sig  . aspirin 81 MG tablet Take 81 mg by mouth daily.  Marland Kitchen atorvastatin (LIPITOR) 40 MG tablet TAKE 1 TABLET BY MOUTH EVERY MORNING  .  carvedilol (COREG) 25 MG tablet Take 1 tablet (25 mg total) by mouth 2 (two) times daily with a meal.  . clopidogrel (PLAVIX) 75 MG tablet Take 1 tablet (75 mg total) by mouth every morning.  . Coenzyme Q10 (COQ10 PO) Take 1 capsule by mouth every morning.  . Fluticasone-Umeclidin-Vilant (TRELEGY ELLIPTA) 100-62.5-25 MCG/INH AEPB Inhale into the lungs.  . hydrochlorothiazide (HYDRODIURIL) 25 MG tablet Take 0.5 tablets (12.5 mg total) by mouth daily.  . nitroGLYCERIN (NITROSTAT) 0.4 MG SL tablet Place 1 tablet under the tongue every 5 (five) minutes as needed.  . Omega-3 Fatty Acids (FISH OIL PO) Take 1 capsule by mouth 2 (two) times daily.  . Red Yeast Rice Extract (RED YEAST RICE PO) Take 1 capsule by mouth 2 (two) times daily.  . sacubitril-valsartan (ENTRESTO) 24-26 MG Take 1 tablet by mouth 2 (two) times daily.     Allergies:   Spironolactone, Sulfasalazine, Sulfonamide derivatives, and Codeine   Social History   Socioeconomic History  . Marital status: Married    Spouse name: Not on file  . Number of children: Not on file  . Years of education: Not on file  . Highest education level: Not on file  Occupational History  . Not on file  Social Needs  . Financial resource strain: Not on file  . Food insecurity  Worry: Not on file    Inability: Not on file  . Transportation needs    Medical: Not on file    Non-medical: Not on file  Tobacco Use  . Smoking status: Current Every Day Smoker    Packs/day: 0.25    Years: 30.00    Pack years: 7.50    Types: Cigarettes  . Smokeless tobacco: Never Used  . Tobacco comment: currently wearing a patch  Substance and Sexual Activity  . Alcohol use: Yes    Comment: 04/30/11 "2-3 beer q other weekend"  . Drug use: No  . Sexual activity: Yes  Lifestyle  . Physical activity    Days per week: Not on file    Minutes per session: Not on file  . Stress: Not on file  Relationships  . Social Herbalist on phone: Not on file     Gets together: Not on file    Attends religious service: Not on file    Active member of club or organization: Not on file    Attends meetings of clubs or organizations: Not on file    Relationship status: Not on file  Other Topics Concern  . Not on file  Social History Narrative  . Not on file     Family History: The patient's family history includes Heart attack in his father. ROS:   Please see the history of present illness.    All 14 point review of systems negative except as described per history of present illness  EKGs/Labs/Other Studies Reviewed:      Recent Labs: No results found for requested labs within last 8760 hours.  Recent Lipid Panel    Component Value Date/Time   CHOL 133 02/17/2018 0944   TRIG 101 02/17/2018 0944   HDL 33 (L) 02/17/2018 0944   CHOLHDL 4.0 02/17/2018 0944   LDLCALC 80 02/17/2018 0944    Physical Exam:    VS:  BP 98/62   Pulse 73   Ht 5\' 6"  (1.676 m)   Wt 131 lb 3.2 oz (59.5 kg)   SpO2 98%   BMI 21.18 kg/m     Wt Readings from Last 3 Encounters:  12/21/18 131 lb 3.2 oz (59.5 kg)  06/28/18 125 lb (56.7 kg)  06/08/18 130 lb (59 kg)     GEN:  Well nourished, well developed in no acute distress HEENT: Normal NECK: No JVD; No carotid bruits LYMPHATICS: No lymphadenopathy CARDIAC: RRR, no murmurs, no rubs, no gallops RESPIRATORY:  Clear to auscultation without rales, wheezing or rhonchi  ABDOMEN: Soft, non-tender, non-distended MUSCULOSKELETAL:  No edema; No deformity  SKIN: Warm and dry LOWER EXTREMITIES: no swelling NEUROLOGIC:  Alert and oriented x 3 PSYCHIATRIC:  Normal affect   ASSESSMENT:    1. Old MI (myocardial infarction)   2. Ischemic cardiomyopathy   3. Chronic systolic congestive heart failure ([place])   4. Late effect of cerebrovascular accident (CVA)   5. Implantable cardioverter-defibrillator (ICD) in situ   6. Dyslipidemia    PLAN:    In order of problems listed above:  1. Old myocardial infarction  noted on appropriate medication which I will continue 2. Ischemic cardiomyopathy maximum tolerated medication today his blood pressures on the lower side therefore I will not be able to increase Entresto.  However overall he feels better on Entresto compared to lisinopril 3. Chronic systolic congestive heart failure is to be compensated on physical exam 4. Late effect of CVA no new problems 5. ICD present  followed by our EP team 6. Dyslipidemia we will check his fasting lipid profile today   Medication Adjustments/Labs and Tests Ordered: Current medicines are reviewed at length with the patient today.  Concerns regarding medicines are outlined above.  No orders of the defined types were placed in this encounter.  Medication changes: No orders of the defined types were placed in this encounter.   Signed, Park Liter, MD, [facility] 12/21/2018 1:43 PM    [place]

## 2018-12-21 NOTE — Patient Instructions (Signed)
Medication Instructions:  Your physician recommends that you continue on your current medications as directed. Please refer to the Current Medication list given to you today.  *If you need a refill on your cardiac medications before your next appointment, please call your pharmacy*  Lab Work: Your physician recommends that you return for lab work today: Lipid   If you have labs (blood work) drawn today and your tests are completely normal, you will receive your results only by: Marland Kitchen MyChart Message (if you have MyChart) OR . A paper copy in the mail If you have any lab test that is abnormal or we need to change your treatment, we will call you to review the results.  Testing/Procedures: None.   Follow-Up: At [facility], you and your health needs are our priority.  As part of our continuing mission to provide you with exceptional heart care, we have created designated Provider Care Teams.  These Care Teams include your primary Cardiologist (physician) and Advanced Practice Providers (APPs -  Physician Assistants and Nurse Practitioners) who all work together to provide you with the care you need, when you need it.  Your next appointment:   5 months  The format for your next appointment:   In Person  Provider:   You will see Dr. Agustin Cree .  Or, you can be scheduled with the following Advanced Practice Provider on your designated Care Team (at our [facility]):  Laurann Montana, FNP    Other Instructions

## 2018-12-22 LAB — LIPID PANEL
Chol/HDL Ratio: 3.6 ratio (ref 0.0–5.0)
Cholesterol, Total: 123 mg/dL (ref 100–199)
HDL: 34 mg/dL — ABNORMAL LOW (ref >39)
LDL Chol Calc (NIH): 72 mg/dL (ref 0–99)
Triglycerides: 84 mg/dL (ref 0–149)
VLDL Cholesterol Cal: 17 mg/dL (ref 5–40)

## 2019-01-02 ENCOUNTER — Ambulatory Visit (INDEPENDENT_AMBULATORY_CARE_PROVIDER_SITE_OTHER): Payer: BC Managed Care – PPO | Admitting: *Deleted

## 2019-01-02 DIAGNOSIS — I5022 Chronic systolic (congestive) heart failure: Secondary | ICD-10-CM | POA: Diagnosis not present

## 2019-01-03 LAB — CUP PACEART REMOTE DEVICE CHECK
Battery Voltage: 2.83 V
Brady Statistic RV Percent Paced: 0.01 %
Date Time Interrogation Session: 20201109122405
HighPow Impedance: 285 Ohm
HighPow Impedance: 62 Ohm
Implantable Lead Implant Date: 20130307
Implantable Lead Location: 753860
Implantable Lead Model: 6935
Implantable Pulse Generator Implant Date: 20130307
Lead Channel Impedance Value: 399 Ohm
Lead Channel Pacing Threshold Amplitude: 0.625 V
Lead Channel Pacing Threshold Pulse Width: 0.4 ms
Lead Channel Sensing Intrinsic Amplitude: 10.5 mV
Lead Channel Sensing Intrinsic Amplitude: 10.5 mV
Lead Channel Setting Pacing Amplitude: 2 V
Lead Channel Setting Pacing Pulse Width: 0.4 ms
Lead Channel Setting Sensing Sensitivity: 0.3 mV

## 2019-02-01 NOTE — Progress Notes (Signed)
Remote pacemaker transmission.   

## 2019-02-10 ENCOUNTER — Other Ambulatory Visit: Payer: Self-pay | Admitting: Cardiology

## 2019-02-10 NOTE — Telephone Encounter (Signed)
Rx refill sent to pharmacy. 

## 2019-02-23 ENCOUNTER — Other Ambulatory Visit: Payer: Self-pay

## 2019-02-23 ENCOUNTER — Emergency Department (HOSPITAL_COMMUNITY): Payer: BC Managed Care – PPO

## 2019-02-23 ENCOUNTER — Emergency Department (HOSPITAL_COMMUNITY)
Admission: EM | Admit: 2019-02-23 | Discharge: 2019-02-23 | Disposition: A | Discharge status: Home / Self Care | Payer: BC Managed Care – PPO | Attending: Emergency Medicine | Admitting: Emergency Medicine

## 2019-02-23 DIAGNOSIS — Z79899 Other long term (current) drug therapy: Secondary | ICD-10-CM | POA: Diagnosis not present

## 2019-02-23 DIAGNOSIS — I2510 Atherosclerotic heart disease of native coronary artery without angina pectoris: Secondary | ICD-10-CM | POA: Insufficient documentation

## 2019-02-23 DIAGNOSIS — R05 Cough: Secondary | ICD-10-CM | POA: Insufficient documentation

## 2019-02-23 DIAGNOSIS — Z95810 Presence of automatic (implantable) cardiac defibrillator: Secondary | ICD-10-CM | POA: Insufficient documentation

## 2019-02-23 DIAGNOSIS — Z7982 Long term (current) use of aspirin: Secondary | ICD-10-CM | POA: Diagnosis not present

## 2019-02-23 DIAGNOSIS — R0600 Dyspnea, unspecified: Secondary | ICD-10-CM | POA: Diagnosis not present

## 2019-02-23 DIAGNOSIS — F17210 Nicotine dependence, cigarettes, uncomplicated: Secondary | ICD-10-CM | POA: Diagnosis not present

## 2019-02-23 DIAGNOSIS — Z20828 Contact with and (suspected) exposure to other viral communicable diseases: Secondary | ICD-10-CM | POA: Diagnosis not present

## 2019-02-23 DIAGNOSIS — I110 Hypertensive heart disease with heart failure: Secondary | ICD-10-CM | POA: Diagnosis not present

## 2019-02-23 DIAGNOSIS — R059 Cough, unspecified: Secondary | ICD-10-CM

## 2019-02-23 DIAGNOSIS — I5022 Chronic systolic (congestive) heart failure: Secondary | ICD-10-CM | POA: Diagnosis not present

## 2019-02-23 LAB — CBC
HCT: 39.9 % (ref 39.0–52.0)
Hemoglobin: 13.4 g/dL (ref 13.0–17.0)
MCH: 28.7 pg (ref 26.0–34.0)
MCHC: 33.6 g/dL (ref 30.0–36.0)
MCV: 85.4 fL (ref 80.0–100.0)
Platelets: 235 10*3/uL (ref 150–400)
RBC: 4.67 MIL/uL (ref 4.22–5.81)
RDW: 14.1 % (ref 11.5–15.5)
WBC: 5 10*3/uL (ref 4.0–10.5)
nRBC: 0 % (ref 0.0–0.2)

## 2019-02-23 LAB — BASIC METABOLIC PANEL
Anion gap: 9 (ref 5–15)
BUN: 10 mg/dL (ref 8–23)
CO2: 26 mmol/L (ref 22–32)
Calcium: 9.4 mg/dL (ref 8.9–10.3)
Chloride: 95 mmol/L — ABNORMAL LOW (ref 98–111)
Creatinine, Ser: 0.85 mg/dL (ref 0.61–1.24)
GFR calc Af Amer: >60 mL/min (ref >60)
GFR calc non Af Amer: >60 mL/min (ref >60)
Glucose, Bld: 116 mg/dL — ABNORMAL HIGH (ref 70–99)
Potassium: 4.6 mmol/L (ref 3.5–5.1)
Sodium: 130 mmol/L — ABNORMAL LOW (ref 135–145)

## 2019-02-23 LAB — TROPONIN I (HIGH SENSITIVITY)
Troponin I (High Sensitivity): 5 ng/L (ref <18)
Troponin I (High Sensitivity): 5 ng/L (ref <18)

## 2019-02-23 LAB — BRAIN NATRIURETIC PEPTIDE: B Natriuretic Peptide: 61.3 pg/mL (ref 0.0–100.0)

## 2019-02-23 MED ORDER — SODIUM CHLORIDE 0.9% FLUSH
3.0000 mL | Freq: Once | INTRAVENOUS | Status: AC
  Administered 2019-02-23: 15:00:00 3 mL via INTRAVENOUS

## 2019-02-23 MED ORDER — ALBUTEROL SULFATE HFA 108 (90 BASE) MCG/ACT IN AERS
4.0000 | INHALATION_SPRAY | Freq: Once | RESPIRATORY_TRACT | Status: AC
  Administered 2019-02-23: 4 via RESPIRATORY_TRACT
  Filled 2019-02-23: qty 6.7

## 2019-02-23 MED ORDER — DOXYCYCLINE HYCLATE 100 MG PO CAPS: 100.0000 mg | ORAL_CAPSULE | Freq: Two times a day (BID) | ORAL | 0 refills | Status: AC

## 2019-02-23 MED ORDER — SODIUM CHLORIDE 0.9 % IV BOLUS
1000.0000 mL | Freq: Once | INTRAVENOUS | Status: AC
  Administered 2019-02-23: 1000 mL via INTRAVENOUS

## 2019-02-23 MED ORDER — IOHEXOL 350 MG/ML SOLN
100.0000 mL | Freq: Once | INTRAVENOUS | Status: AC | PRN
  Administered 2019-02-23: 100 mL via INTRAVENOUS

## 2019-02-23 MED ORDER — PREDNISONE 20 MG PO TABS: 40.0000 mg | ORAL_TABLET | Freq: Every day | ORAL | 0 refills | Status: AC

## 2019-02-23 NOTE — ED Triage Notes (Signed)
Pt presents with c/o SOB and CP for a few days. Pt states he has a hx of CHF. Pt endorses productive cough, generalized weakness, and poor appetite. Pt states he tested negative for COVID in the beginning of December. Pt denies sick contacts. Pt A+Ox4, in NAD during triage. Pt endorses hypotension at home as well.

## 2019-02-23 NOTE — ED Provider Notes (Signed)
[facility] EMERGENCY DEPARTMENT Provider Note   CSN: [id number] Arrival date & time: 02/23/19  1140     History Chief Complaint  Patient presents with  . Cough  . Shortness of Breath    [name] is a 63 y.o. male.  HPI   63 y/o male with a h/o basal cell epithelioma, CAD, HLD, HTN, ischemic cardiomyopathy, CAD who presents to the ED today for eval of shortness of breath that has been ongoing intermittently for the last 2 months but sxs seem to have been worsening over the last 2 weeks. He reports dyspnea with exertion and he also has some orthopnea. He denies and BLE swelling. He reports he has had a cough for about 3 weeks that is productive of dark yellow/green phlegm that seems to be worse in the mornings. Denies any pain with inspiration and denies chest pain. He does have some palpitations. Denies hemoptysis.   Denies recent surgery/trauma, recent long travel, personal hx of cancer, or hx of DVT/PE.   He has a h/o tobacco use for >15 years. Smokes about 10 cigarettes/day. Was tested for COVID earlier this month and that was negative. 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 States that his PCP thinks he may have COPD and he has been started on Trilogy which helps his sxs somewhat.   Past Medical History:  Diagnosis Date  . 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 CAD (coronary artery disease)    s/p anterior wall MI 2005 with VF arrest at tha ttime s/p stent of prox LAD, with repeat intervention at that time for ruptured plque in RCA which had cleared so significantly that PCI was no longer contemplated   . High cholesterol   . Hypertension   . Ischemic cardiomyopathy    EF 30-35% s/p ICD placement 2005. 6949 lead malfunction/device ERI s/p new lead & generator change (Medtronic) 04/2011.    Patient Active Problem List   Diagnosis Date Noted  . Late effect of cerebrovascular accident (CVA) 09/01/2018  .  Chronic systolic congestive heart failure ([place]) 04/14/2017  . Dyslipidemia 04/14/2017  . H/O heart artery stent 04/14/2017  . Old MI (myocardial infarction) 04/14/2017  . Ischemic cardiomyopathy   . CAD, NATIVE VESSEL 03/20/2008  . Implantable cardioverter-defibrillator (ICD) in situ 03/20/2008    Past Surgical History:  Procedure Laterality Date  . APPENDECTOMY  ~ 1974  . CORONARY ANGIOPLASTY WITH STENT PLACEMENT  2005  . ICD placement  2005  . ICD replaced  04/30/11   ICD lead replaced; old ICD removed; new ICD placed  . LEAD REVISION N/A 04/30/2011   Procedure: LEAD REVISION;  Surgeon: Evans Lance, MD;  Location: [facility] CATH LAB;  Service: Cardiovascular;  Laterality: N/A;  . NASAL SEPTUM SURGERY  1988       Family History  Problem Relation Age of Onset  . Heart attack Father     Social History   Tobacco Use  . Smoking status: Current Every Day Smoker    Packs/day: 0.25    Years: 30.00    Pack years: 7.50    Types: Cigarettes  . Smokeless tobacco: Never Used  . Tobacco comment: currently wearing a patch  Substance Use Topics  . Alcohol use: Yes    Comment: 04/30/11 "2-3 beer q other weekend"  . Drug use: No    Home Medications Prior to Admission medications   Medication Sig Start Date End Date  Taking? Authorizing Provider  aspirin 81 MG tablet Take 81 mg by mouth daily.    [provider]  atorvastatin (LIPITOR) 40 MG tablet TAKE 1 TABLET BY MOUTH EVERY MORNING 09/20/18   Park Liter, MD  carvedilol (COREG) 25 MG tablet Take 1 tablet (25 mg total) by mouth 2 (two) times daily with a meal. 09/26/18   Park Liter, MD  clopidogrel (PLAVIX) 75 MG tablet Take 1 tablet (75 mg total) by mouth every morning. 09/26/18   Park Liter, MD  Coenzyme Q10 (COQ10 PO) Take 1 capsule by mouth every morning.    [provider]  doxycycline (VIBRAMYCIN) 100 MG capsule Take 1 capsule (100 mg total) by mouth 2 (two) times daily for 7 days. 02/23/19  03/02/19  Kamarie Veno S, PA-C  Fluticasone-Umeclidin-Vilant (TRELEGY ELLIPTA) 100-62.5-25 MCG/INH AEPB Inhale into the lungs.    [provider]  hydrochlorothiazide (HYDRODIURIL) 25 MG tablet TAKE 1/2 TABLET BY MOUTH DAILY 02/10/19   Park Liter, MD  nitroGLYCERIN (NITROSTAT) 0.4 MG SL tablet Place 1 tablet (0.4 mg total) under the tongue every 5 (five) minutes as needed. 12/21/18   Park Liter, MD  Omega-3 Fatty Acids (FISH OIL PO) Take 1 capsule by mouth 2 (two) times daily.    [provider]  predniSONE (DELTASONE) 20 MG tablet Take 2 tablets (40 mg total) by mouth daily for 5 days. 02/23/19 02/28/19  Alexanderia Gorby S, PA-C  Red Yeast Rice Extract (RED YEAST RICE PO) Take 1 capsule by mouth 2 (two) times daily.    [provider]  sacubitril-valsartan (ENTRESTO) 24-26 MG Take 1 tablet by mouth 2 (two) times daily. 12/09/18   Park Liter, MD    Allergies    Spironolactone, Sulfasalazine, Sulfonamide derivatives, and Codeine  Review of Systems   Review of Systems  Constitutional: Negative for chills and fever.  HENT: Negative for ear pain and sore throat.   Eyes: Negative for visual disturbance.  Respiratory: Positive for cough and shortness of breath.   Cardiovascular: Negative for leg swelling.  Gastrointestinal: Negative for abdominal pain, constipation, diarrhea, nausea and vomiting.  Genitourinary: Negative for dysuria and hematuria.  Musculoskeletal: Negative for back pain.  Skin: Negative for color change and rash.  Neurological: Negative for headaches.  All other systems reviewed and are negative.   Physical Exam Updated Vital Signs BP (!) 147/85   Pulse 73   Temp (!) 97.2 F (36.2 C) (Oral)   Resp 19   SpO2 98%   Physical Exam Vitals and nursing note reviewed.  Constitutional:      Appearance: He is well-developed.  HENT:     Head: Normocephalic and atraumatic.  Eyes:     Conjunctiva/sclera: Conjunctivae  normal.  Cardiovascular:     Rate and Rhythm: Normal rate and regular rhythm.     Heart sounds: No murmur.  Pulmonary:     Effort: Pulmonary effort is normal. No respiratory distress.     Breath sounds: Wheezing (end expiratory wheezing) present.  Abdominal:     Palpations: Abdomen is soft.     Tenderness: There is no abdominal tenderness.  Musculoskeletal:     Cervical back: Neck supple.     Right lower leg: No tenderness. No edema.     Left lower leg: No tenderness. No edema.  Skin:    General: Skin is warm and dry.  Neurological:     Mental Status: He is alert.     ED Results /  Procedures / Treatments   Labs (all labs ordered are listed, but only abnormal results are displayed) Labs Reviewed  BASIC METABOLIC PANEL - Abnormal; Notable for the following components:      Result Value   Sodium 130 (*)    Chloride 95 (*)    Glucose, Bld 116 (*)    All other components within normal limits  NOVEL CORONAVIRUS, NAA (HOSP ORDER, SEND-OUT TO REF LAB; TAT 18-24 HRS)  CBC  BRAIN NATRIURETIC PEPTIDE  TROPONIN I (HIGH SENSITIVITY)  TROPONIN I (HIGH SENSITIVITY)    EKG EKG Interpretation  Date/Time:  Thursday February 23 2019 13:43:21 EST Ventricular Rate:  69 PR Interval:    QRS Duration: 84 QT Interval:  352 QTC Calculation: 377 R Axis:   98 Text Interpretation: Sinus rhythm Probable anterolateral infarct, age indeterm When compared to prior, no significant cahnges seen. No STEMI Confirmed by Antony Blackbird [phone]) on 02/23/2019 1:47:04 PM   Radiology DG Chest 2 View  Result Date: 02/23/2019 CLINICAL DATA:  Shortness of breath for several weeks. EXAM: CHEST - 2 VIEW COMPARISON:  Single-view of the chest 02/03/2019. PA and lateral chest 05/01/2011. FINDINGS: AICD is again seen. The chest is hyperexpanded with attenuation of the pulmonary vasculature. Lungs are clear. No pneumothorax or pleural fluid. Heart size is normal. No acute or focal bony abnormality. IMPRESSION: No  acute disease. The lungs appear emphysematous. Electronically Signed   By: Inge Rise M.D.   On: 02/23/2019 12:44   CT Angio Chest PE W and/or Wo Contrast  Result Date: 02/23/2019 CLINICAL DATA:  Per ed notes: Pt presents with c/o SOB and CP for a few days. Pt states he has a hx of CHF. Pt endorses productive cough, generalized weakness, and poor appetite. EXAM: CT ANGIOGRAPHY CHEST WITH CONTRAST TECHNIQUE: Multidetector CT imaging of the chest was performed using the standard protocol during bolus administration of intravenous contrast. Multiplanar CT image reconstructions and MIPs were obtained to evaluate the vascular anatomy. CONTRAST:  58mL OMNIPAQUE IOHEXOL 350 MG/ML SOLN COMPARISON:  None. FINDINGS: Cardiovascular: Left chest ICD with leads terminating in the right ventricle. Moderate atherosclerotic calcification in the thoracic aorta without evidence of aneurysm. Coronary artery calcification. Mediastinum/Nodes: No enlarged mediastinal, hilar, or axillary lymph nodes. Thyroid gland, trachea, and esophagus demonstrate no significant findings. Lungs/Pleura: Central airways are patent. There is no evidence of pulmonary embolism. There is no focal opacity in the bilateral lungs. Moderate upper lobe predominant centrilobular emphysema. No pneumothorax or pleural effusion. There is mild diffuse bronchial wall thickening. There is a 6 mm ground-glass nodule in the left lower lobe (series 6, image 115). There is a 3 mm ground-glass nodule in the right lower lobe (series 6, image 121). Upper Abdomen: There is a small gallstone. No associated inflammatory changes. Upper abdomen is otherwise unremarkable. Musculoskeletal: No chest wall abnormality. Multilevel degenerative disc disease in the thoracic spine. Review of the MIP images confirms the above findings. IMPRESSION: 1. No evidence of pulmonary embolism. 2. Moderate upper lobe predominant centrilobular emphysema. 3. There are 2 ground-glass nodules in  the bilateral lower lobes, the largest of which measures 6 mm in the left lower lobe. Follow-up CT in 3-6 months is recommended. 4. Cholelithiasis without evidence of cholecystitis. Aortic Atherosclerosis (ICD10-I70.0) and Emphysema (ICD10-J43.9). Electronically Signed   By: Audie Pinto M.D.   On: 02/23/2019 18:43    Procedures Procedures (including critical care time)  Medications Ordered in ED Medications  sodium chloride flush (NS) 0.9 % injection 3 mL (3  mLs Intravenous Given 02/23/19 1437)  albuterol (VENTOLIN HFA) 108 (90 Base) MCG/ACT inhaler 4 puff (4 puffs Inhalation Given 02/23/19 1637)  sodium chloride 0.9 % bolus 1,000 mL (0 mLs Intravenous Stopped 02/23/19 1613)  iohexol (OMNIPAQUE) 350 MG/ML injection 100 mL (100 mLs Intravenous Contrast Given 02/23/19 1823)    ED Course  I have reviewed the triage vital signs and the nursing notes.  Pertinent labs & imaging results that were available during my care of the patient were reviewed by me and considered in my medical decision making (see chart for details).    MDM Rules/Calculators/A&P                      63 year old male presenting for evaluation of dyspnea on exertion ongoing intermittently for the last few months but worsening over the last 1 to 2 weeks.  Is associated with a cough.  On exam patient does have end expiratory wheezing.  He does not have any evidence of peripheral edema.  Heart with regular rate and rhythm.  His vital signs show some mild tachypnea but otherwise are reassuring.  CBC is without leukocytosis or anemia BMP with mild hyponatremia and hypochloremia, otherwise reassuring BNP is negative therefore lower suspicion for heart failure Delta troponins are negative  EKG without acute ischemic changes, unchanged from prior.  Chest x-ray with no acute disease. The lungs appear emphysematous.   CTA chest with no evidence of pulmonary embolism. Moderate upper lobe predominant centrilobular  emphysema. There are 2 ground-glass nodules in the bilateral lower lobes, the largest of which measures 6 mm in the left lower lobe. Follow-up CT in 3-6 months is recommended. Cholelithiasis without evidence of cholecystitis.  4:13 PM Spoke with Amy who states that pacemaker pt is being paced 0.1% of the time. No episodes recorded.   Pt ambulated with pulse ox and maintained sats at 94% and above without any significant dyspnea.  On reassessment he states that he does feel improved after the albuterol inhaler.  I discussed results of his CT scan including findings of emphysema and lung nodules that will require follow-up.  I suspect his symptoms today may be related to exacerbation of underlying/undiagnosed COPD.  I will discharge him with albuterol, prednisone, antibiotics.  He does have a Covid test pending but I do have lower suspicion for this given the duration of his symptoms.  I did advise him to follow-up with his PCP closely next week and return to the emergency department for new or worsening symptoms.  He voices understanding of the plan and reasons to return.  All questions answered.  Patient stable for discharge.  ----  [name] was evaluated in Emergency Department on 02/23/2019 for the symptoms described in the history of present illness. He was evaluated in the context of the global COVID-19 pandemic, which necessitated consideration that the patient might be at risk for infection with the SARS-CoV-2 virus that causes COVID-19. Institutional protocols and algorithms that pertain to the evaluation of patients at risk for COVID-19 are in a state of rapid change based on information released by regulatory bodies including the CDC and federal and state organizations. These policies and algorithms were followed during the patient's care in the ED.   Final Clinical Impression(s) / ED Diagnoses Final diagnoses:  Cough  Dyspnea, unspecified type    Rx / DC Orders ED Discharge  Orders         Ordered    doxycycline (VIBRAMYCIN) 100 MG capsule  2 times daily     02/23/19 1946    predniSONE (DELTASONE) 20 MG tablet  Daily     02/23/19 1946           Bishop Dublin 02/23/19 1949    Tegeler, Gwenyth Allegra, MD 02/23/19 2122

## 2019-02-23 NOTE — ED Notes (Signed)
Patient ambulated with no issues, gait steady. Sp02 remained 94-100%, patient denies difficulty when asked.

## 2019-02-23 NOTE — Discharge Instructions (Addendum)
Please use 2 puffs of the inhaler every 4-6 hours as needed for shortness of breath.  You are also given a prescription for steroids and antibiotics.  Please take these as directed.  You were tested for the coronavirus.  Your results will be available within the next few days.  If they are positive the hospital will contact you and let you know.  If they are negative the hospital would not contact you.  You should stay out of work until you are aware of your results.  If your results are positive you should continue to quarantine for 14 days.  Please return the emergency department for any new or worsening symptoms in the meantime including chest pain, shortness of breath, leg swelling, persistent fevers, or any new concerns.

## 2019-02-23 NOTE — ED Notes (Signed)
ED Provider at bedside. 

## 2019-02-23 NOTE — ED Notes (Signed)
While the pt was lying down during orthostatic vitals, the pt stated that he "felt short of breath". The pt also felt a "little dizziness" when standing.

## 2019-02-23 NOTE — ED Notes (Signed)
Patient transported to CT 

## 2019-02-24 LAB — NOVEL CORONAVIRUS, NAA (HOSP ORDER, SEND-OUT TO REF LAB; TAT 18-24 HRS): SARS-CoV-2, NAA: NOT DETECTED

## 2019-03-07 ENCOUNTER — Ambulatory Visit (INDEPENDENT_AMBULATORY_CARE_PROVIDER_SITE_OTHER): Payer: BC Managed Care – PPO | Admitting: Cardiology

## 2019-03-07 ENCOUNTER — Encounter: Payer: Self-pay | Admitting: Cardiology

## 2019-03-07 ENCOUNTER — Other Ambulatory Visit: Payer: Self-pay

## 2019-03-07 VITALS — BP 108/76 | HR 66 | Ht 66.0 in | Wt 128.2 lb

## 2019-03-07 DIAGNOSIS — Z95810 Presence of automatic (implantable) cardiac defibrillator: Secondary | ICD-10-CM | POA: Diagnosis not present

## 2019-03-07 DIAGNOSIS — I5022 Chronic systolic (congestive) heart failure: Secondary | ICD-10-CM | POA: Diagnosis not present

## 2019-03-07 DIAGNOSIS — E785 Hyperlipidemia, unspecified: Secondary | ICD-10-CM | POA: Diagnosis not present

## 2019-03-07 DIAGNOSIS — I255 Ischemic cardiomyopathy: Secondary | ICD-10-CM

## 2019-03-07 NOTE — Progress Notes (Signed)
Cardiology Office Note:    Date:  03/07/2019   ID:  [name], DOB 1955/11/24, MRN [id number]  PCP:  Street, Sharon Mt, MD  Cardiologist:  Jenne Campus, MD    Referring MD: Street, Sharon Mt, *   Chief Complaint  Patient presents with  . Follow-up  I was in the emergency room  History of Present Illness:    [name] is a 64 y.o. male past medical history significant for coronary artery disease in 2005 he suffered from acute anterior wall myocardial infarction he ended up having V. fib arrest that required defibrillation.  Stent to proximal LAD was placed at that time.  At that time he was found to have cardiomyopathy with diminished ejection fraction 30 to 35%, ICD was implanted which is a Medtronic device.  He is a chronic smoker.  He comes today to my office for follow-up.  Recently he ended up going to the emergency room.  He was short of breath he was find to have exacerbation of COPD.  He was given prednisone antibiotic as well as bronchodilators seems to be doing better from that point of view.  Comes today to my office to discuss those issues.  The biggest news is the fact that finally quit smoking.  He has been off cigarettes for about a week.  I congratulated him for it and strongly encouraged him to stay away from cigarettes.  Past Medical History:  Diagnosis Date  . 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 CAD (coronary artery disease)    s/p anterior wall MI 2005 with VF arrest at tha ttime s/p stent of prox LAD, with repeat intervention at that time for ruptured plque in RCA which had cleared so significantly that PCI was no longer contemplated   . High cholesterol   . Hypertension   . Ischemic cardiomyopathy    EF 30-35% s/p ICD placement 2005. 6949 lead malfunction/device ERI s/p new lead & generator change (Medtronic) 04/2011.    Past Surgical History:  Procedure Laterality Date  . APPENDECTOMY  ~ 1974  . CORONARY ANGIOPLASTY  WITH STENT PLACEMENT  2005  . ICD placement  2005  . ICD replaced  04/30/11   ICD lead replaced; old ICD removed; new ICD placed  . LEAD REVISION N/A 04/30/2011   Procedure: LEAD REVISION;  Surgeon: Evans Lance, MD;  Location: [facility] CATH LAB;  Service: Cardiovascular;  Laterality: N/A;  . NASAL SEPTUM SURGERY  1988    Current Medications: Current Meds  Medication Sig  . albuterol (PROVENTIL HFA) 108 (90 Base) MCG/ACT inhaler Inhale 2 puffs into the lungs every 4 (four) hours as needed for wheezing or shortness of breath.  Marland Kitchen aspirin 81 MG tablet Take 81 mg by mouth daily.  Marland Kitchen atorvastatin (LIPITOR) 40 MG tablet TAKE 1 TABLET BY MOUTH EVERY MORNING  . carvedilol (COREG) 25 MG tablet Take 1 tablet (25 mg total) by mouth 2 (two) times daily with a meal.  . clopidogrel (PLAVIX) 75 MG tablet Take 1 tablet (75 mg total) by mouth every morning.  . Coenzyme Q10 (COQ10 PO) Take 1 capsule by mouth every morning.  . Fluticasone-Umeclidin-Vilant (TRELEGY ELLIPTA) 100-62.5-25 MCG/INH AEPB Inhale into the lungs.  . hydrochlorothiazide (HYDRODIURIL) 25 MG tablet TAKE 1/2 TABLET BY MOUTH DAILY  . nicotine (NICODERM CQ - DOSED IN MG/24 HOURS) 21 mg/24hr patch   . nitroGLYCERIN (NITROSTAT) 0.4 MG SL tablet Place 1 tablet (0.4 mg total) under the  tongue every 5 (five) minutes as needed.  . Omega-3 Fatty Acids (FISH OIL PO) Take 1 capsule by mouth 2 (two) times daily.  . Red Yeast Rice Extract (RED YEAST RICE PO) Take 1 capsule by mouth 2 (two) times daily.  . sacubitril-valsartan (ENTRESTO) 24-26 MG Take 1 tablet by mouth 2 (two) times daily.     Allergies:   Spironolactone, Sulfasalazine, Sulfonamide derivatives, and Codeine   Social History   Socioeconomic History  . Marital status: Married    Spouse name: Not on file  . Number of children: Not on file  . Years of education: Not on file  . Highest education level: Not on file  Occupational History  . Not on file  Tobacco Use  . Smoking status:  Current Every Day Smoker    Packs/day: 0.25    Years: 30.00    Pack years: 7.50    Types: Cigarettes  . Smokeless tobacco: Never Used  . Tobacco comment: currently wearing a patch  Substance and Sexual Activity  . Alcohol use: Yes    Comment: 04/30/11 "2-3 beer q other weekend"  . Drug use: No  . Sexual activity: Yes  Other Topics Concern  . Not on file  Social History Narrative  . Not on file   Social Determinants of Health   Financial Resource Strain:   . Difficulty of Paying Living Expenses: Not on file  Food Insecurity:   . Worried About Charity fundraiser in the Last Year: Not on file  . Ran Out of Food in the Last Year: Not on file  Transportation Needs:   . Lack of Transportation (Medical): Not on file  . Lack of Transportation (Non-Medical): Not on file  Physical Activity:   . Days of Exercise per Week: Not on file  . Minutes of Exercise per Session: Not on file  Stress:   . Feeling of Stress : Not on file  Social Connections:   . Frequency of Communication with Friends and Family: Not on file  . Frequency of Social Gatherings with Friends and Family: Not on file  . Attends Religious Services: Not on file  . Active Member of Clubs or Organizations: Not on file  . Attends Archivist Meetings: Not on file  . Marital Status: Not on file     Family History: The patient's family history includes Heart attack in his father. ROS:   Please see the history of present illness.    All 14 point review of systems negative except as described per history of present illness  EKGs/Labs/Other Studies Reviewed:      Recent Labs: 02/23/2019: B Natriuretic Peptide 61.3; BUN 10; Creatinine, Ser 0.85; Hemoglobin 13.4; Platelets 235; Potassium 4.6; Sodium 130  Recent Lipid Panel    Component Value Date/Time   CHOL 123 12/21/2018 1404   TRIG 84 12/21/2018 1404   HDL 34 (L) 12/21/2018 1404   CHOLHDL 3.6 12/21/2018 1404   LDLCALC 72 12/21/2018 1404    Physical  Exam:    VS:  BP 108/76   Pulse 66   Ht 5\' 6"  (1.676 m)   Wt 128 lb 3.2 oz (58.2 kg)   SpO2 98%   BMI 20.69 kg/m     Wt Readings from Last 3 Encounters:  03/07/19 128 lb 3.2 oz (58.2 kg)  12/21/18 131 lb 3.2 oz (59.5 kg)  06/28/18 125 lb (56.7 kg)     GEN:  Well nourished, well developed in no acute distress HEENT: Normal  NECK: No JVD; No carotid bruits LYMPHATICS: No lymphadenopathy CARDIAC: RRR, no murmurs, no rubs, no gallops RESPIRATORY:  Clear to auscultation without rales, wheezing or rhonchi  ABDOMEN: Soft, non-tender, non-distended MUSCULOSKELETAL:  No edema; No deformity  SKIN: Warm and dry LOWER EXTREMITIES: no swelling NEUROLOGIC:  Alert and oriented x 3 PSYCHIATRIC:  Normal affect   ASSESSMENT:    1. Ischemic cardiomyopathy   2. Chronic systolic congestive heart failure ([place])   3. Implantable cardioverter-defibrillator (ICD) in situ   4. Dyslipidemia    PLAN:    In order of problems listed above:  1. Cardiomyopathy with diminished ejection fraction.  I will schedule him to have another echocardiogram to reassess left ventricle ejection fraction.  He is on appropriate medications which he can tolerate.  That include Entresto 2426 as well as carvedilol 25 twice daily.  Overall he seems to be compensated right now.  Last interrogation of his device done in November showed normal OptiVol with normal parameters. 2. Chronic congestive heart failure seems to be compensated 3. ICD present noted 4. Dyslipidemia on high intensity statin which I will continue.  I will ask him to have an echocardiogram done to check left ventricle ejection fraction.  I congratulated him for the fact that he quit smoking.  I encouraged him to stay away from it.   Medication Adjustments/Labs and Tests Ordered: Current medicines are reviewed at length with the patient today.  Concerns regarding medicines are outlined above.  No orders of the defined types were placed in this  encounter.  Medication changes: No orders of the defined types were placed in this encounter.   Signed, Park Liter, MD, [facility] 03/07/2019 11:16 AM    [place]

## 2019-03-07 NOTE — Patient Instructions (Signed)
Medication Instructions:  Your physician recommends that you continue on your current medications as directed. Please refer to the Current Medication list given to you today.  *If you need a refill on your cardiac medications before your next appointment, please call your pharmacy*  Lab Work: None.  If you have labs (blood work) drawn today and your tests are completely normal, you will receive your results only by: Marland Kitchen MyChart Message (if you have MyChart) OR . A paper copy in the mail If you have any lab test that is abnormal or we need to change your treatment, we will call you to review the results.  Testing/Procedures: Your physician has requested that you have an echocardiogram. Echocardiography is a painless test that uses sound waves to create images of your heart. It provides your doctor with information about the size and shape of your heart and how well your heart's chambers and valves are working. This procedure takes approximately one hour. There are no restrictions for this procedure.    Follow-Up: At [facility], you and your health needs are our priority.  As part of our continuing mission to provide you with exceptional heart care, we have created designated Provider Care Teams.  These Care Teams include your primary Cardiologist (physician) and Advanced Practice Providers (APPs -  Physician Assistants and Nurse Practitioners) who all work together to provide you with the care you need, when you need it.  Your next appointment:   3 month(s)  The format for your next appointment:   In Person  Provider:   Jenne Campus, MD  Other Instructions

## 2019-03-09 ENCOUNTER — Other Ambulatory Visit (HOSPITAL_BASED_OUTPATIENT_CLINIC_OR_DEPARTMENT_OTHER): Payer: BC Managed Care – PPO

## 2019-03-15 ENCOUNTER — Other Ambulatory Visit: Payer: Self-pay

## 2019-03-15 ENCOUNTER — Ambulatory Visit (HOSPITAL_BASED_OUTPATIENT_CLINIC_OR_DEPARTMENT_OTHER)
Admission: RE | Admit: 2019-03-15 | Discharge: 2019-03-15 | Disposition: A | Discharge status: Home / Self Care | Payer: BC Managed Care – PPO | Source: Ambulatory Visit | Attending: Cardiology | Admitting: Cardiology

## 2019-03-15 DIAGNOSIS — I255 Ischemic cardiomyopathy: Secondary | ICD-10-CM | POA: Diagnosis present

## 2019-03-15 MED ORDER — PERFLUTREN LIPID MICROSPHERE
1.0000 mL | INTRAVENOUS | Status: AC | PRN
  Administered 2019-03-15: 3 mL via INTRAVENOUS
  Filled 2019-03-15: qty 10

## 2019-03-15 NOTE — Progress Notes (Signed)
  Echocardiogram 2D Echocardiogram has been performed.    [name] 03/15/2019, 11:33 AM

## 2019-03-16 ENCOUNTER — Other Ambulatory Visit: Payer: Self-pay | Admitting: Cardiology

## 2019-03-22 ENCOUNTER — Telehealth: Payer: Self-pay | Admitting: Emergency Medicine

## 2019-03-22 NOTE — Telephone Encounter (Signed)
Left results on patient's voicemail per dpr. Advised patient to return call with any questions.

## 2019-04-03 ENCOUNTER — Ambulatory Visit (INDEPENDENT_AMBULATORY_CARE_PROVIDER_SITE_OTHER): Payer: BC Managed Care – PPO | Admitting: *Deleted

## 2019-04-03 DIAGNOSIS — I5022 Chronic systolic (congestive) heart failure: Secondary | ICD-10-CM

## 2019-04-03 LAB — CUP PACEART REMOTE DEVICE CHECK
Battery Voltage: 2.8 V
Brady Statistic RV Percent Paced: 0.01 %
Date Time Interrogation Session: 20210208121608
HighPow Impedance: 285 Ohm
HighPow Impedance: 72 Ohm
Implantable Lead Implant Date: 20130307
Implantable Lead Location: 753860
Implantable Lead Model: 6935
Implantable Pulse Generator Implant Date: 20130307
Lead Channel Impedance Value: 361 Ohm
Lead Channel Pacing Threshold Amplitude: 0.75 V
Lead Channel Pacing Threshold Pulse Width: 0.4 ms
Lead Channel Sensing Intrinsic Amplitude: 8.875 mV
Lead Channel Sensing Intrinsic Amplitude: 8.875 mV
Lead Channel Setting Pacing Amplitude: 2 V
Lead Channel Setting Pacing Pulse Width: 0.4 ms
Lead Channel Setting Sensing Sensitivity: 0.3 mV

## 2019-04-04 NOTE — Progress Notes (Signed)
PPM Remote  

## 2019-04-12 ENCOUNTER — Other Ambulatory Visit: Payer: Self-pay | Admitting: Cardiology

## 2019-06-05 ENCOUNTER — Ambulatory Visit: Payer: BC Managed Care – PPO | Admitting: Cardiology

## 2019-06-06 ENCOUNTER — Other Ambulatory Visit: Payer: Self-pay | Admitting: Cardiology

## 2019-06-07 ENCOUNTER — Other Ambulatory Visit: Payer: Self-pay

## 2019-06-07 ENCOUNTER — Encounter: Payer: Self-pay | Admitting: Cardiology

## 2019-06-07 ENCOUNTER — Ambulatory Visit: Payer: BC Managed Care – PPO | Admitting: Cardiology

## 2019-06-07 VITALS — BP 110/60 | HR 64 | Ht 66.0 in | Wt 126.0 lb

## 2019-06-07 DIAGNOSIS — I255 Ischemic cardiomyopathy: Secondary | ICD-10-CM

## 2019-06-07 DIAGNOSIS — E785 Hyperlipidemia, unspecified: Secondary | ICD-10-CM

## 2019-06-07 DIAGNOSIS — I5022 Chronic systolic (congestive) heart failure: Secondary | ICD-10-CM

## 2019-06-07 DIAGNOSIS — Z95810 Presence of automatic (implantable) cardiac defibrillator: Secondary | ICD-10-CM | POA: Diagnosis not present

## 2019-06-07 DIAGNOSIS — R748 Abnormal levels of other serum enzymes: Secondary | ICD-10-CM

## 2019-06-07 DIAGNOSIS — I6930 Unspecified sequelae of cerebral infarction: Secondary | ICD-10-CM

## 2019-06-07 NOTE — Progress Notes (Signed)
Cardiology Office Note:    Date:  06/07/2019   ID:  [name], DOB December 14, 1955, MRN [id number]  PCP:  Street, Sharon Mt, MD  Cardiologist:  Jenne Campus, MD    Referring MD: [address] Sharon Mt, *   No chief complaint on file. I have cramps in my arms and legs  History of Present Illness:    [name] is a 64 y.o. male   past medical history significant for coronary artery disease in 2005 he suffered from acute anterior wall myocardial infarction he ended up having V. fib arrest that required defibrillation.  Stent to proximal LAD was placed at that time.  At that time he was found to have cardiomyopathy with diminished ejection fraction 30 to 35%, ICD was implanted which is a Medtronic device.  He is a chronic smoker.  He comes today to my office for follow-up.  Recently he ended up going to the emergency room.  He was short of breath he was find to have exacerbation of COPD.  He was given prednisone antibiotic as well as bronchodilators seems to be doing better from that point of view. Today he complains of having cramps in his hands as well as his legs.  Cramps in his legs are not exertional.  He can wake up in the middle of night having cramps.  He recommended could be related to some of the medication he is taking any concerned about it.  Otherwise cardiac wise doing well.  Denies have any shortness of breath chest pain tightness squeezing pressure burning chest no swelling of lower extremities no palpitations, no discharge from the defibrillator.  Past Medical History:  Diagnosis Date  . 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 CAD (coronary artery disease)    s/p anterior wall MI 2005 with VF arrest at tha ttime s/p stent of prox LAD, with repeat intervention at that time for ruptured plque in RCA which had cleared so significantly that PCI was no longer contemplated   . High cholesterol   . Hypertension   . Ischemic cardiomyopathy    EF 30-35% s/p  ICD placement 2005. 6949 lead malfunction/device ERI s/p new lead & generator change (Medtronic) 04/2011.    Past Surgical History:  Procedure Laterality Date  . APPENDECTOMY  ~ 1974  . CORONARY ANGIOPLASTY WITH STENT PLACEMENT  2005  . ICD placement  2005  . ICD replaced  04/30/11   ICD lead replaced; old ICD removed; new ICD placed  . LEAD REVISION N/A 04/30/2011   Procedure: LEAD REVISION;  Surgeon: Evans Lance, MD;  Location: [facility] CATH LAB;  Service: Cardiovascular;  Laterality: N/A;  . NASAL SEPTUM SURGERY  1988    Current Medications: No outpatient medications have been marked as taking for the 06/07/19 encounter (Appointment) with Park Liter, MD.     Allergies:   Spironolactone, Sulfasalazine, Sulfonamide derivatives, and Codeine   Social History   Socioeconomic History  . Marital status: Married    Spouse name: Not on file  . Number of children: Not on file  . Years of education: Not on file  . Highest education level: Not on file  Occupational History  . Not on file  Tobacco Use  . Smoking status: Current Every Day Smoker    Packs/day: 0.25    Years: 30.00    Pack years: 7.50    Types: Cigarettes  . Smokeless tobacco: Never Used  . Tobacco comment: currently wearing a  patch  Substance and Sexual Activity  . Alcohol use: Yes    Comment: 04/30/11 "2-3 beer q other weekend"  . Drug use: No  . Sexual activity: Yes  Other Topics Concern  . Not on file  Social History Narrative  . Not on file   Social Determinants of Health   Financial Resource Strain:   . Difficulty of Paying Living Expenses:   Food Insecurity:   . Worried About Charity fundraiser in the Last Year:   . Arboriculturist in the Last Year:   Transportation Needs:   . Film/video editor (Medical):   Marland Kitchen Lack of Transportation (Non-Medical):   Physical Activity:   . Days of Exercise per Week:   . Minutes of Exercise per Session:   Stress:   . Feeling of Stress :   Social Connections:    . Frequency of Communication with Friends and Family:   . Frequency of Social Gatherings with Friends and Family:   . Attends Religious Services:   . Active Member of Clubs or Organizations:   . Attends Archivist Meetings:   Marland Kitchen Marital Status:      Family History: The patient's family history includes Heart attack in his father. ROS:   Please see the history of present illness.    All 14 point review of systems negative except as described per history of present illness  EKGs/Labs/Other Studies Reviewed:      Recent Labs: 02/23/2019: B Natriuretic Peptide 61.3; BUN 10; Creatinine, Ser 0.85; Hemoglobin 13.4; Platelets 235; Potassium 4.6; Sodium 130  Recent Lipid Panel    Component Value Date/Time   CHOL 123 12/21/2018 1404   TRIG 84 12/21/2018 1404   HDL 34 (L) 12/21/2018 1404   CHOLHDL 3.6 12/21/2018 1404   LDLCALC 72 12/21/2018 1404    Physical Exam:    VS:  There were no vitals taken for this visit.    Wt Readings from Last 3 Encounters:  03/07/19 128 lb 3.2 oz (58.2 kg)  12/21/18 131 lb 3.2 oz (59.5 kg)  06/28/18 125 lb (56.7 kg)     GEN:  Well nourished, well developed in no acute distress HEENT: Normal NECK: No JVD; No carotid bruits LYMPHATICS: No lymphadenopathy CARDIAC: RRR, no murmurs, no rubs, no gallops RESPIRATORY:  Clear to auscultation without rales, wheezing or rhonchi  ABDOMEN: Soft, non-tender, non-distended MUSCULOSKELETAL:  No edema; No deformity  SKIN: Warm and dry LOWER EXTREMITIES: no swelling NEUROLOGIC:  Alert and oriented x 3 PSYCHIATRIC:  Normal affect   ASSESSMENT:    1. Chronic systolic congestive heart failure ([place])   2. Ischemic cardiomyopathy   3. Dyslipidemia   4. Implantable cardioverter-defibrillator (ICD) in situ   5. Late effect of cerebrovascular accident (CVA)    PLAN:    In order of problems listed above:  1. Chronic systolic congestive heart failure.  On maximal tolerated medications the biggest  problem is his blood pressure being low.  I will check his complete metabolic panel as well as magnesium today to rule out possibility of electrolyte imbalance Gutkowski cramps. 2. Dyslipidemia he is on high intensity statin takes Lipitor 40.  We will make arrangements for fasting blood profile. 3. ICD present, it is a Medtronic device.  I did review interrogation normal function with no longer symptomatic with arrhythmia. 4. History of CVA on aspirin Plavix which I will continue.   Medication Adjustments/Labs and Tests Ordered: Current medicines are reviewed at length with the patient  today.  Concerns regarding medicines are outlined above.  No orders of the defined types were placed in this encounter.  Medication changes: No orders of the defined types were placed in this encounter.   Signed, Park Liter, MD, [facility] 06/07/2019 11:05 AM    [place]

## 2019-06-07 NOTE — Addendum Note (Signed)
Addended by: Linna Hoff R on: 06/07/2019 11:50 AM   Modules accepted: Orders

## 2019-06-07 NOTE — Patient Instructions (Signed)
Medication Instructions:  Your physician recommends that you continue on your current medications as directed. Please refer to the Current Medication list given to you today.  *If you need a refill on your cardiac medications before your next appointment, please call your pharmacy*   Lab Work: Your physician recommends that you return for lab work today: cmp, mg   If you have labs (blood work) drawn today and your tests are completely normal, you will receive your results only by: Marland Kitchen MyChart Message (if you have MyChart) OR . A paper copy in the mail If you have any lab test that is abnormal or we need to change your treatment, we will call you to review the results.   Testing/Procedures: None.    Follow-Up: At [facility], you and your health needs are our priority.  As part of our continuing mission to provide you with exceptional heart care, we have created designated Provider Care Teams.  These Care Teams include your primary Cardiologist (physician) and Advanced Practice Providers (APPs -  Physician Assistants and Nurse Practitioners) who all work together to provide you with the care you need, when you need it.  We recommend signing up for the patient portal called "MyChart".  Sign up information is provided on this After Visit Summary.  MyChart is used to connect with patients for Virtual Visits (Telemedicine).  Patients are able to view lab/test results, encounter notes, upcoming appointments, etc.  Non-urgent messages can be sent to your provider as well.   To learn more about what you can do with MyChart, go to [email or web address].    Your next appointment:   5 month(s)  The format for your next appointment:   In Person  Provider:   Jenne Campus, MD   Other Instructions

## 2019-06-08 LAB — COMPREHENSIVE METABOLIC PANEL
ALT: 91 IU/L — ABNORMAL HIGH (ref 0–44)
AST: 77 IU/L — ABNORMAL HIGH (ref 0–40)
Albumin/Globulin Ratio: 1.5 (ref 1.2–2.2)
Albumin: 4.3 g/dL (ref 3.8–4.8)
Alkaline Phosphatase: 53 IU/L (ref 39–117)
BUN/Creatinine Ratio: 16 (ref 10–24)
BUN: 14 mg/dL (ref 8–27)
Bilirubin Total: 0.4 mg/dL (ref 0.0–1.2)
CO2: 26 mmol/L (ref 20–29)
Calcium: 9.4 mg/dL (ref 8.6–10.2)
Chloride: 98 mmol/L (ref 96–106)
Creatinine, Ser: 0.88 mg/dL (ref 0.76–1.27)
GFR calc Af Amer: 107 mL/min/{1.73_m2} (ref >59)
GFR calc non Af Amer: 93 mL/min/{1.73_m2} (ref >59)
Globulin, Total: 2.8 g/dL (ref 1.5–4.5)
Glucose: 88 mg/dL (ref 65–99)
Potassium: 5.4 mmol/L — ABNORMAL HIGH (ref 3.5–5.2)
Sodium: 135 mmol/L (ref 134–144)
Total Protein: 7.1 g/dL (ref 6.0–8.5)

## 2019-06-08 LAB — MAGNESIUM: Magnesium: 1.6 mg/dL (ref 1.6–2.3)

## 2019-07-03 ENCOUNTER — Ambulatory Visit (INDEPENDENT_AMBULATORY_CARE_PROVIDER_SITE_OTHER): Payer: Self-pay | Admitting: *Deleted

## 2019-07-03 DIAGNOSIS — I255 Ischemic cardiomyopathy: Secondary | ICD-10-CM

## 2019-07-04 LAB — CUP PACEART REMOTE DEVICE CHECK
Battery Voltage: 2.72 V
Brady Statistic RV Percent Paced: 0.01 %
Date Time Interrogation Session: 20210510164226
HighPow Impedance: 304 Ohm
HighPow Impedance: 66 Ohm
Implantable Lead Implant Date: 20130307
Implantable Lead Location: 753860
Implantable Lead Model: 6935
Implantable Pulse Generator Implant Date: 20130307
Lead Channel Impedance Value: 361 Ohm
Lead Channel Pacing Threshold Amplitude: 0.75 V
Lead Channel Pacing Threshold Pulse Width: 0.4 ms
Lead Channel Sensing Intrinsic Amplitude: 8.875 mV
Lead Channel Sensing Intrinsic Amplitude: 8.875 mV
Lead Channel Setting Pacing Amplitude: 2 V
Lead Channel Setting Pacing Pulse Width: 0.4 ms
Lead Channel Setting Sensing Sensitivity: 0.3 mV

## 2019-07-05 NOTE — Progress Notes (Signed)
Remote ICD transmission.   

## 2019-07-12 ENCOUNTER — Telehealth: Payer: Self-pay | Admitting: Emergency Medicine

## 2019-07-12 MED ORDER — HYDROCHLOROTHIAZIDE 25 MG PO TABS: 25.0000 mg | ORAL_TABLET | Freq: Every day | ORAL | 1 refills | Status: DC

## 2019-07-12 NOTE — Telephone Encounter (Signed)
-----   Message from Park Liter, MD sent at 06/08/2019 12:29 PM EDT ----- Laboratory test shows slightly elevated potassium.  Please increase hydrochlorothiazide to 25 quadrants daily, he does have baseline liver function elevation to slightly worse as compared to year ago.  Please schedule AST ALT in about a month

## 2019-07-12 NOTE — Addendum Note (Signed)
Addended by: Ashok Norris on: 07/12/2019 08:43 AM   Modules accepted: Orders

## 2019-07-12 NOTE — Telephone Encounter (Signed)
Called patient informed him of test results and recommendations. Patient advised to increase hydrochlorothiazide to 25 mg daily and have ast and alt checked. Orders accidentally placed on patient's last encounter.

## 2019-09-08 ENCOUNTER — Other Ambulatory Visit: Payer: Self-pay | Admitting: Cardiology

## 2019-10-02 ENCOUNTER — Ambulatory Visit (INDEPENDENT_AMBULATORY_CARE_PROVIDER_SITE_OTHER): Payer: Self-pay | Admitting: *Deleted

## 2019-10-02 DIAGNOSIS — I5022 Chronic systolic (congestive) heart failure: Secondary | ICD-10-CM

## 2019-10-04 LAB — CUP PACEART REMOTE DEVICE CHECK
Battery Voltage: 2.68 V
Brady Statistic RV Percent Paced: 0.01 %
Date Time Interrogation Session: 20210810182207
HighPow Impedance: 285 Ohm
HighPow Impedance: 67 Ohm
Implantable Lead Implant Date: 20130307
Implantable Lead Location: 753860
Implantable Lead Model: 6935
Implantable Pulse Generator Implant Date: 20130307
Lead Channel Impedance Value: 361 Ohm
Lead Channel Pacing Threshold Amplitude: 0.625 V
Lead Channel Pacing Threshold Pulse Width: 0.4 ms
Lead Channel Sensing Intrinsic Amplitude: 10.375 mV
Lead Channel Sensing Intrinsic Amplitude: 10.375 mV
Lead Channel Setting Pacing Amplitude: 2 V
Lead Channel Setting Pacing Pulse Width: 0.4 ms
Lead Channel Setting Sensing Sensitivity: 0.3 mV

## 2019-10-05 NOTE — Progress Notes (Signed)
Remote pacemaker transmission.   

## 2019-10-08 ENCOUNTER — Other Ambulatory Visit: Payer: Self-pay | Admitting: Cardiology

## 2019-10-26 ENCOUNTER — Encounter: Payer: Self-pay | Admitting: Cardiology

## 2019-10-26 ENCOUNTER — Other Ambulatory Visit: Payer: Self-pay

## 2019-10-26 ENCOUNTER — Ambulatory Visit (INDEPENDENT_AMBULATORY_CARE_PROVIDER_SITE_OTHER): Payer: Commercial Managed Care - PPO | Admitting: Cardiology

## 2019-10-26 VITALS — BP 100/70 | HR 74 | Ht 66.0 in | Wt 135.0 lb

## 2019-10-26 DIAGNOSIS — I255 Ischemic cardiomyopathy: Secondary | ICD-10-CM | POA: Diagnosis not present

## 2019-10-26 DIAGNOSIS — I6930 Unspecified sequelae of cerebral infarction: Secondary | ICD-10-CM

## 2019-10-26 DIAGNOSIS — I252 Old myocardial infarction: Secondary | ICD-10-CM

## 2019-10-26 DIAGNOSIS — E785 Hyperlipidemia, unspecified: Secondary | ICD-10-CM

## 2019-10-26 DIAGNOSIS — Z95810 Presence of automatic (implantable) cardiac defibrillator: Secondary | ICD-10-CM | POA: Diagnosis not present

## 2019-10-26 LAB — LIPID PANEL
Chol/HDL Ratio: 4.2 ratio (ref 0.0–5.0)
Cholesterol, Total: 135 mg/dL (ref 100–199)
HDL: 32 mg/dL — ABNORMAL LOW (ref >39)
LDL Chol Calc (NIH): 87 mg/dL (ref 0–99)
Triglycerides: 82 mg/dL (ref 0–149)
VLDL Cholesterol Cal: 16 mg/dL (ref 5–40)

## 2019-10-26 NOTE — Patient Instructions (Signed)
Medication Instructions:  Your physician recommends that you continue on your current medications as directed. Please refer to the Current Medication list given to you today.  *If you need a refill on your cardiac medications before your next appointment, please call your pharmacy*   Lab Work: Your physician recommends that you return for lab work today: lipid  If you have labs (blood work) drawn today and your tests are completely normal, you will receive your results only by: . MyChart Message (if you have MyChart) OR . A paper copy in the mail If you have any lab test that is abnormal or we need to change your treatment, we will call you to review the results.   Testing/Procedures: None   Follow-Up: At [facility], you and your health needs are our priority.  As part of our continuing mission to provide you with exceptional heart care, we have created designated Provider Care Teams.  These Care Teams include your primary Cardiologist (physician) and Advanced Practice Providers (APPs -  Physician Assistants and Nurse Practitioners) who all work together to provide you with the care you need, when you need it.  We recommend signing up for the patient portal called "MyChart".  Sign up information is provided on this After Visit Summary.  MyChart is used to connect with patients for Virtual Visits (Telemedicine).  Patients are able to view lab/test results, encounter notes, upcoming appointments, etc.  Non-urgent messages can be sent to your provider as well.   To learn more about what you can do with MyChart, go to [email or web address].    Your next appointment:   5 month(s)  The format for your next appointment:   In Person  Provider:   Robert Krasowski, MD   Other Instructions    

## 2019-10-26 NOTE — Addendum Note (Signed)
Addended by: Ashok Norris on: 10/26/2019 09:30 AM   Modules accepted: Orders

## 2019-10-26 NOTE — Progress Notes (Signed)
Cardiology Office Note:    Date:  10/26/2019   ID:  [name], DOB Dec 31, 1955, MRN [id number]  PCP:  Street, [name] Mt, MD  Cardiologist:  [name] Campus, MD    Referring MD: Street, [name], *   Chief Complaint  Patient presents with   Follow-up  I am doing fine  History of Present Illness:    [name] is a 64 y.o. male  past medical history significant for coronary artery disease in 2005 he suffered from acute anterior wall myocardial infarction he ended up having V. fib arrest that required defibrillation. Stent to proximal LAD was placed at that time. At that time he was found to have cardiomyopathy with diminished ejection fraction 30 to 35%, ICD was implanted which is a Medtronic device. He is a chronic smoker. He comes today to my office for follow-up Comes today to my office for follow-up.  Overall seems to be doing fine.  He is very frustrated about his job.  Recently he changed his job but still very unhappy.  Denies have any cardiac complaint.  No chest pain tightness squeezing pressure burning chest, no dizziness no passing out no discharge from the defibrillator.  Weeks ago he had some issue with diarrhea however that being resolved.  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 6949 lead malfunction/device ERI s/p new lead & generator change (Medtronic) 04/2011.    Past Surgical History:  Procedure Laterality Date   APPENDECTOMY  ~ [place]  2005   ICD placement  2005   ICD replaced  04/30/11   ICD lead  replaced; old ICD removed; new ICD placed   LEAD REVISION N/A 04/30/2011   Procedure: LEAD REVISION;  Surgeon: [name] Lance, MD;  Location: [facility] CATH LAB;  Service: Cardiovascular;  Laterality: N/A;   NASAL SEPTUM SURGERY  1988    Current Medications: Current Meds  Medication Sig   ADVAIR DISKUS 500-50 MCG/DOSE AEPB Inhale 1 puff into the lungs 2 (two) times daily.   albuterol (PROVENTIL HFA) 108 (90 Base) MCG/ACT inhaler Inhale 2 puffs into the lungs every 4 (four) hours as needed for wheezing or shortness of breath.   aspirin 81 MG tablet Take 81 mg by mouth daily.   atorvastatin (LIPITOR) 40 MG tablet TAKE 1 TABLET BY MOUTH EVERY MORNING   carvedilol (COREG) 25 MG tablet TAKE 1 TABLET BY MOUTH TWICE DAILY WITH A MEAL   clopidogrel (PLAVIX) 75 MG tablet TAKE 1 TABLET BY MOUTH EVERY MORNING   Coenzyme Q10 (COQ10 PO) Take 1 capsule by mouth every morning.   ENTRESTO 24-26 MG TAKE 1 TABLET BY MOUTH TWICE DAILY   hydrochlorothiazide (HYDRODIURIL) 25 MG tablet Take 1 tablet (25 mg total) by mouth daily.   nicotine (NICODERM CQ - DOSED IN MG/24 HOURS) 21 mg/24hr patch    nitroGLYCERIN (NITROSTAT) 0.4 MG SL tablet Place 1 tablet (0.4 mg total) under the tongue every 5 (five) minutes as needed.   Omega-3 Fatty Acids (FISH  OIL PO) Take 1 capsule by mouth 2 (two) times daily.   Red Yeast Rice Extract (RED YEAST RICE PO) Take 1 capsule by mouth 2 (two) times daily.     Allergies:   Spironolactone, Sulfasalazine, Sulfonamide derivatives, and Codeine   Social History   Socioeconomic History   Marital status: Married    Spouse name: Not on file   Number of children: Not on file   Years of education: Not on file   Highest education level: Not on file  Occupational History   Not on file  Tobacco Use   Smoking status: Former Smoker    Packs/day: 0.25    Years: 30.00    Pack years: 7.50    Types: Cigarettes    Quit date: 09/25/2019    Years since quitting: 0.0    Smokeless tobacco: Never Used   Tobacco comment: currently wearing a patch  Substance and Sexual Activity   Alcohol use: Yes    Comment: 04/30/11 "2-3 beer q other weekend"   Drug use: No   Sexual activity: Yes  Other Topics Concern   Not on file  Social History Narrative   Not on file   Social Determinants of Health   Financial Resource Strain:    Difficulty of Paying Living Expenses: Not on file  Food Insecurity:    Worried About Charity fundraiser in the Last Year: Not on file   [employer] of Food in the Last Year: Not on file  Transportation Needs:    Lack of Transportation (Medical): Not on file   Lack of Transportation (Non-Medical): Not on file  Physical Activity:    Days of Exercise per Week: Not on file   Minutes of Exercise per Session: Not on file  Stress:    Feeling of Stress : Not on file  Social Connections:    Frequency of Communication with Friends and Family: Not on file   Frequency of Social Gatherings with Friends and Family: Not on file   Attends Religious Services: Not on file   Active Member of Clubs or Organizations: Not on file   Attends Archivist Meetings: Not on file   Marital Status: Not on file     Family History: The patient's family history includes Heart attack in his father. ROS:   Please see the history of present illness.    All 14 point review of systems negative except as described per history of present illness  EKGs/Labs/Other Studies Reviewed:      Recent Labs: 02/23/2019: B Natriuretic Peptide 61.3; Hemoglobin 13.4; Platelets 235 06/07/2019: ALT 91; BUN 14; Creatinine, Ser 0.88; Magnesium 1.6; Potassium 5.4; Sodium 135  Recent Lipid Panel    Component Value Date/Time   CHOL 123 12/21/2018 1404   TRIG 84 12/21/2018 1404   HDL 34 (L) 12/21/2018 1404   CHOLHDL 3.6 12/21/2018 1404   LDLCALC 72 12/21/2018 1404    Physical Exam:    VS:  BP 100/70 (BP Location: Right Arm, Patient Position: Sitting,  Cuff Size: Normal)    Pulse 74    Ht 5\' 6"  (1.676 m)    Wt 135 lb (61.2 kg)    SpO2 97%    BMI 21.79 kg/m     Wt Readings from Last 3 Encounters:  10/26/19 135 lb (61.2 kg)  06/07/19 126 lb (57.2 kg)  03/07/19 128 lb 3.2 oz (58.2 kg)     GEN:  Well nourished, well developed in no acute distress HEENT: Normal NECK:  No JVD; No carotid bruits LYMPHATICS: No lymphadenopathy CARDIAC: RRR, no murmurs, no rubs, no gallops RESPIRATORY:  Clear to auscultation without rales, wheezing or rhonchi  ABDOMEN: Soft, non-tender, non-distended MUSCULOSKELETAL:  No edema; No deformity  SKIN: Warm and dry LOWER EXTREMITIES: no swelling NEUROLOGIC:  Alert and oriented x 3 PSYCHIATRIC:  Normal affect   ASSESSMENT:    1. Ischemic cardiomyopathy   2. Old MI (myocardial infarction)   3. Late effect of cerebrovascular accident (CVA)   4. Implantable cardioverter-defibrillator (ICD) in situ   5. Dyslipidemia    PLAN:    In order of problems listed above:  1. Ischemic cardiomyopathy on appropriate medication he can tolerate.  The dose of Entresto is very small, however, because of low blood pressure cannot increase it, he is however on very decent dose of carvedilol/Coreg.  We will continue with that.  He is not a candidate for Aldactone because of high potassium. 2. Old myocardial infarction hemodynamically stable asymptomatic doing well continue present management. 3. Late effect of CVA stable. 4. ICD present followed by our EP team. 5. Dyslipidemia we will check his fasting lipid profile today.   Medication Adjustments/Labs and Tests Ordered: Current medicines are reviewed at length with the patient today.  Concerns regarding medicines are outlined above.  No orders of the defined types were placed in this encounter.  Medication changes: No orders of the defined types were placed in this encounter.   Signed, Park Liter, MD, [facility] 10/26/2019 9:26 AM    [place]

## 2019-11-07 ENCOUNTER — Telehealth: Payer: Self-pay | Admitting: Emergency Medicine

## 2019-11-07 DIAGNOSIS — E785 Hyperlipidemia, unspecified: Secondary | ICD-10-CM

## 2019-11-07 MED ORDER — ATORVASTATIN CALCIUM 80 MG PO TABS: 80.0000 mg | ORAL_TABLET | Freq: Every morning | ORAL | 1 refills | Status: DC

## 2019-11-07 NOTE — Telephone Encounter (Signed)
Follow Up:; ° ° °Returning your call. °

## 2019-11-07 NOTE — Telephone Encounter (Signed)
Called patient informed him of results and recommendations no further questions.

## 2019-11-07 NOTE — Telephone Encounter (Signed)
See additional phone call.

## 2019-11-07 NOTE — Telephone Encounter (Signed)
-----   Message from Park Liter, MD sent at 11/01/2019 11:24 AM EDT ----- Cholesterol still on the higher side, ideally we need to double dose of Lipitor from 40-80, fasting lipid profile need to be repeated within the next 6 weeks with AST ALT

## 2019-12-07 ENCOUNTER — Other Ambulatory Visit: Payer: Self-pay | Admitting: Cardiology

## 2020-01-01 ENCOUNTER — Ambulatory Visit (INDEPENDENT_AMBULATORY_CARE_PROVIDER_SITE_OTHER): Payer: Commercial Managed Care - PPO

## 2020-01-01 DIAGNOSIS — I255 Ischemic cardiomyopathy: Secondary | ICD-10-CM | POA: Diagnosis not present

## 2020-01-03 LAB — CUP PACEART REMOTE DEVICE CHECK
Battery Voltage: 2.64 V
Brady Statistic RV Percent Paced: 0.01 %
Date Time Interrogation Session: 20211108192505
HighPow Impedance: 285 Ohm
HighPow Impedance: 56 Ohm
Implantable Lead Implant Date: 20130307
Implantable Lead Location: 753860
Implantable Lead Model: 6935
Implantable Pulse Generator Implant Date: 20130307
Lead Channel Impedance Value: 342 Ohm
Lead Channel Pacing Threshold Amplitude: 0.75 V
Lead Channel Pacing Threshold Pulse Width: 0.4 ms
Lead Channel Sensing Intrinsic Amplitude: 9.125 mV
Lead Channel Sensing Intrinsic Amplitude: 9.125 mV
Lead Channel Setting Pacing Amplitude: 2 V
Lead Channel Setting Pacing Pulse Width: 0.4 ms
Lead Channel Setting Sensing Sensitivity: 0.3 mV

## 2020-01-03 NOTE — Progress Notes (Signed)
Remote ICD transmission.   

## 2020-01-30 ENCOUNTER — Other Ambulatory Visit: Payer: Self-pay | Admitting: Cardiology

## 2020-02-05 ENCOUNTER — Other Ambulatory Visit: Payer: Self-pay | Admitting: Cardiology

## 2020-02-05 NOTE — Telephone Encounter (Signed)
Rx refill sent to pharmacy. 

## 2020-03-20 ENCOUNTER — Other Ambulatory Visit: Payer: Self-pay

## 2020-03-20 DIAGNOSIS — C4491 Basal cell carcinoma of skin, unspecified: Secondary | ICD-10-CM | POA: Insufficient documentation

## 2020-03-20 DIAGNOSIS — I10 Essential (primary) hypertension: Secondary | ICD-10-CM | POA: Insufficient documentation

## 2020-03-20 DIAGNOSIS — E7800 Pure hypercholesterolemia, unspecified: Secondary | ICD-10-CM | POA: Insufficient documentation

## 2020-03-25 ENCOUNTER — Ambulatory Visit: Payer: Commercial Managed Care - PPO | Admitting: Cardiology

## 2020-04-01 ENCOUNTER — Telehealth: Payer: Self-pay | Admitting: *Deleted

## 2020-04-01 ENCOUNTER — Telehealth: Payer: Self-pay | Admitting: Emergency Medicine

## 2020-04-01 ENCOUNTER — Ambulatory Visit (INDEPENDENT_AMBULATORY_CARE_PROVIDER_SITE_OTHER): Payer: Commercial Managed Care - PPO

## 2020-04-01 DIAGNOSIS — I255 Ischemic cardiomyopathy: Secondary | ICD-10-CM | POA: Diagnosis not present

## 2020-04-01 LAB — CUP PACEART REMOTE DEVICE CHECK
Battery Voltage: 2.63 V
Brady Statistic RV Percent Paced: 0.01 %
Date Time Interrogation Session: 20220207012404
HighPow Impedance: 247 Ohm
HighPow Impedance: 64 Ohm
Implantable Lead Implant Date: 20130307
Implantable Lead Location: 753860
Implantable Lead Model: 6935
Implantable Pulse Generator Implant Date: 20130307
Lead Channel Impedance Value: 361 Ohm
Lead Channel Pacing Threshold Amplitude: 0.875 V
Lead Channel Pacing Threshold Pulse Width: 0.4 ms
Lead Channel Sensing Intrinsic Amplitude: 9.25 mV
Lead Channel Sensing Intrinsic Amplitude: 9.25 mV
Lead Channel Setting Pacing Amplitude: 2 V
Lead Channel Setting Pacing Pulse Width: 0.4 ms
Lead Channel Setting Sensing Sensitivity: 0.3 mV

## 2020-04-01 NOTE — Telephone Encounter (Signed)
Attempted to contact patient to assess symptoms in reference to elevated OptiVol monitoring on remote transmission that was received 04/01/2020.  RRT is at 2.63 volts. Information to be discussed with patient and also sent to scheduling for future appointment in reference to (consult for Gen change). Left message on patient's home answering machine with message to contact the device clinic at [phone].

## 2020-04-01 NOTE — Telephone Encounter (Signed)
Left message to call back  

## 2020-04-01 NOTE — Telephone Encounter (Signed)
-----   Message from Will Meredith Leeds, MD sent at 04/01/2020  2:36 PM EST ----- Abnormal device interrogation reviewed.  Lead parameters and battery status stable.  Optivol elevated encourage low salt diet. Needs frequent checks for battery status.

## 2020-04-03 NOTE — Telephone Encounter (Signed)
Patient informed device at RRT. Has appointment with Enedina Finner PA to discuss gen change on 04/28/20.Notified Optivol elevated. Patient reports he has not taken his HCTZ for 2 weeks and needs refill. Patient has refills available and will call [facility] to get refill. Instructed to take HCTZ tonight  as today's dose and tomorrow morning start daily dose in AM. Education done on low sodium diet.

## 2020-04-03 NOTE — Telephone Encounter (Signed)
The patient returning nurse phone call. I let him speak with Cindy, rn.

## 2020-04-05 ENCOUNTER — Other Ambulatory Visit: Payer: Self-pay | Admitting: Cardiology

## 2020-04-05 NOTE — Telephone Encounter (Signed)
Refill for Entresto 24-26 mg tablets to [facility], [address], [place]

## 2020-04-08 NOTE — Progress Notes (Signed)
Remote pacemaker transmission.   

## 2020-04-29 NOTE — Progress Notes (Signed)
Cardiology Office Note Date:  05/01/2020  Patient ID:  [name], [name] 10-Nov-1955, MRN [id number] PCP:  Street, Sharon Mt, MD  Cardiologist:  Dr. Agustin Cree Electrophysiologist: Dr. Curt Bears    Chief Complaint: device ERI  History of Present Illness: [name] is a 65 y.o. male with history of CAD (2005 LAD PCI, MI complicated by VF arrest), ICM, ICD, HTN, HLD, chronic CHF (systolic).  He comes in today to be seen for Dr. Curt Bears, last seen by him Feb 2020, was doing well, encouraged to stop smoking.  More recently saw Dr. Agustin Cree Sept 2021, doing OK, BP limited ability to titrate meds,, planned to update his labs   Device reached voltage RRT criteria 2//7/22, had not tripped ERI  TODAY He is doing quite well. Still working and quite active. No CP, palpitations or cardiac awareness. No dizzy spells, no orthostatic symptoms, no near syncope or syncope. No SOB, denies symptoms of orthopnea or PND.  He is trying to quit smoking, but has proven hard to do.  Device information MDT single chamber ICD implanted 2005, gen change  And new RV lead 04/30/2011 He has a capped/abandoned 6949 lead   Past Medical History:  Diagnosis Date  . 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 CAD (coronary artery disease)    s/p anterior wall MI 2005 with VF arrest at tha ttime s/p stent of prox LAD, with repeat intervention at that time for ruptured plque in RCA which had cleared so significantly that PCI was no longer contemplated   . CAD, NATIVE VESSEL 03/20/2008   Qualifier: Diagnosis of  By: Caryl Comes, MD, [facility], Mack Guise   . Chronic systolic congestive heart failure ([place]) 04/14/2017  . Coronary artery disease involving native coronary artery of native heart without angina pectoris 11/05/2014   s/p anterior wall MI 2005 with VF arrest at tha ttime s/p stent of prox LAD, with repeat intervention at that time for ruptured plque in RCA which had cleared so significantly  that PCI was no longer contemplated  Formatting of this note might be different from the original. Status post anterior wall myocardial infarction stent to LAD in 2005  . Dyslipidemia 04/14/2017  . H/O heart artery stent 04/14/2017  . High cholesterol   . Hypertension   . Implantable cardioverter-defibrillator (ICD) in situ 03/20/2008   Qualifier: Diagnosis of  By: Caryl Comes, MD, [facility], Mack Guise   . Ischemic cardiomyopathy    EF 30-35% s/p ICD placement 2005. 6949 lead malfunction/device ERI s/p new lead & generator change (Medtronic) 04/2011.  Marland Kitchen Late effect of cerebrovascular accident (CVA) 09/01/2018  . Old MI (myocardial infarction) 04/14/2017    Past Surgical History:  Procedure Laterality Date  . APPENDECTOMY  ~ 1974  . CORONARY ANGIOPLASTY WITH STENT PLACEMENT  2005  . ICD placement  2005  . ICD replaced  04/30/11   ICD lead replaced; old ICD removed; new ICD placed  . LEAD REVISION N/A 04/30/2011   Procedure: LEAD REVISION;  Surgeon: Evans Lance, MD;  Location: [facility] CATH LAB;  Service: Cardiovascular;  Laterality: N/A;  . NASAL SEPTUM SURGERY  1988    Current Outpatient Medications  Medication Sig Dispense Refill  . ADVAIR DISKUS 500-50 MCG/DOSE AEPB Inhale 1 puff into the lungs 2 (two) times daily.    Marland Kitchen albuterol (VENTOLIN HFA) 108 (90 Base) MCG/ACT inhaler Inhale 2 puffs into the lungs every 4 (four) hours as needed for wheezing or shortness of breath.    Marland Kitchen  aspirin 81 MG tablet Take 81 mg by mouth daily.    Marland Kitchen atorvastatin (LIPITOR) 40 MG tablet Take 40 mg by mouth daily.    . carvedilol (COREG) 25 MG tablet TAKE 1 TABLET BY MOUTH TWICE DAILY WITH A MEAL 180 tablet 2  . clopidogrel (PLAVIX) 75 MG tablet TAKE 1 TABLET BY MOUTH EVERY MORNING 90 tablet 2  . Coenzyme Q10 (COQ10 PO) Take 1 capsule by mouth every morning.    Marland Kitchen ENTRESTO 24-26 MG TAKE 1 TABLET BY MOUTH TWICE DAILY 60 tablet 3  . hydrochlorothiazide (HYDRODIURIL) 25 MG tablet TAKE 1 TABLET(25 MG) BY MOUTH DAILY 90 tablet 1   . nicotine (NICODERM CQ - DOSED IN MG/24 HOURS) 21 mg/24hr patch     . nitroGLYCERIN (NITROSTAT) 0.4 MG SL tablet Place 1 tablet (0.4 mg total) under the tongue every 5 (five) minutes as needed. 25 tablet 6  . Omega-3 Fatty Acids (FISH OIL PO) Take 1 capsule by mouth 2 (two) times daily.    . Red Yeast Rice Extract (RED YEAST RICE PO) Take 1 capsule by mouth 2 (two) times daily.     No current facility-administered medications for this visit.    Allergies:   Spironolactone, Sulfasalazine, Sulfonamide derivatives, and Codeine   Social History:  The patient  reports that he quit smoking about 7 months ago. His smoking use included cigarettes. He has a 7.50 pack-year smoking history. He has never used smokeless tobacco. He reports current alcohol use. He reports that he does not use drugs.   Family History:  The patient's family history includes Heart attack in his father.  ROS:  Please see the history of present illness.    All other systems are reviewed and otherwise negative.   PHYSICAL EXAM:  VS:  BP 94/64   Pulse 64   Ht 5\' 6"  (1.676 m)   Wt 128 lb 3.2 oz (58.2 kg)   SpO2 98%   BMI 20.69 kg/m  BMI: Body mass index is 20.69 kg/m. Well nourished, well developed, in no acute distress HEENT: normocephalic, atraumatic Neck: no JVD, carotid bruits or masses Cardiac:  RRR; no significant murmurs, no rubs, or gallops Lungs:  CTA b/l, no wheezing, rhonchi or rales Abd: soft, nontender MS: no deformity or atrophy Ext: no edema Skin: warm and dry, no rash Neuro:  No gross deficits appreciated Psych: euthymic mood, full affect  ICD site is stable, no tethering or discomfort   EKG:  Done today and reviewed by myself shows  SR 64bpm,   Device interrogation done today and reviewed by myself:  Battery voltage is 2.62V (RRT is 2.63), RRT alert however has not been tripped yet 3 NSVT Lead measurements are good VP 0%  03/15/2019: TTE IMPRESSIONS  1. Left ventricular ejection  fraction, by visual estimation, is 30 to  35%.There is no left ventricular hypertrophy.  2. Definity contrast agent was given IV to delineate the left ventricular  endocardial borders.  3. The left ventricle demonstrates regional wall motion abnormalities.  4. Moderate global hypokinesis with apical cap akinesis.    Recent Labs: 06/07/2019: ALT 91; BUN 14; Creatinine, Ser 0.88; Magnesium 1.6; Potassium 5.4; Sodium 135  10/26/2019: Chol/HDL Ratio 4.2; Cholesterol, Total 135; HDL 32; LDL Chol Calc (NIH) 87; Triglycerides 82   CrCl cannot be calculated (Patient's most recent lab result is older than the maximum 21 days allowed.).   Wt Readings from Last 3 Encounters:  05/01/20 128 lb 3.2 oz (58.2 kg)  10/26/19 135  lb (61.2 kg)  06/07/19 126 lb (57.2 kg)     Other studies reviewed: Additional studies/records reviewed today include: summarized above  ASSESSMENT AND PLAN:  1. ICD     Has reached RRT by voltage, though not tripped indicator yet.  We have discussed generator change procedure, potential risks, benefits, he is agreeabl eto proceed once RRT indicator has been reached, he is aware of the audible tone and will let us know if he hears it He is scheduled for remote battery check 06/03/20  He can be scheduled when RRT is noted.  Will plan to have him hold his Plavix 2 days prior to his procedure Plan for antimicrobial pouch at time of his procedure  2. CAD     No symptoms of angina     On BB, statin, plavix     C/w Dr. Agustin Cree  3. ICM 4. Chronic CHF (systolic)     No symptoms or exam findings of of volume OL     optiVol looks good     On BB, entresto, diuretic     C/w Dr. Agustin Cree     BP is a little low, no symptoms, he says it is not typically <100, follow without changes    Disposition: F/u with usual post procedure appts, Have made a 57mo visit with Camnitz to ensure f/u on device.  Current medicines are reviewed at length with the patient today.  The patient  did not have any concerns regarding medicines.  Venetia Night, PA-C 05/01/2020 1:06 PM     [place]  [id number] [phone] (office)  [phone] (fax)

## 2020-05-01 ENCOUNTER — Encounter: Payer: Self-pay | Admitting: Physician Assistant

## 2020-05-01 ENCOUNTER — Ambulatory Visit (INDEPENDENT_AMBULATORY_CARE_PROVIDER_SITE_OTHER): Payer: Commercial Managed Care - PPO | Admitting: Physician Assistant

## 2020-05-01 ENCOUNTER — Other Ambulatory Visit: Payer: Self-pay

## 2020-05-01 ENCOUNTER — Ambulatory Visit (INDEPENDENT_AMBULATORY_CARE_PROVIDER_SITE_OTHER): Payer: Commercial Managed Care - PPO

## 2020-05-01 VITALS — BP 94/64 | HR 64 | Ht 66.0 in | Wt 128.2 lb

## 2020-05-01 DIAGNOSIS — I5022 Chronic systolic (congestive) heart failure: Secondary | ICD-10-CM

## 2020-05-01 DIAGNOSIS — Z95810 Presence of automatic (implantable) cardiac defibrillator: Secondary | ICD-10-CM

## 2020-05-01 DIAGNOSIS — I255 Ischemic cardiomyopathy: Secondary | ICD-10-CM

## 2020-05-01 DIAGNOSIS — I2510 Atherosclerotic heart disease of native coronary artery without angina pectoris: Secondary | ICD-10-CM | POA: Diagnosis not present

## 2020-05-01 LAB — CUP PACEART INCLINIC DEVICE CHECK
Battery Voltage: 2.62 V
Brady Statistic RV Percent Paced: 0.01 %
Date Time Interrogation Session: 20220309161039
HighPow Impedance: 304 Ohm
HighPow Impedance: 66 Ohm
Implantable Lead Implant Date: 20130307
Implantable Lead Location: 753860
Implantable Lead Model: 6935
Implantable Pulse Generator Implant Date: 20130307
Lead Channel Impedance Value: 361 Ohm
Lead Channel Pacing Threshold Amplitude: 0.75 V
Lead Channel Pacing Threshold Pulse Width: 0.4 ms
Lead Channel Sensing Intrinsic Amplitude: 10.125 mV
Lead Channel Sensing Intrinsic Amplitude: 10.5 mV
Lead Channel Setting Pacing Amplitude: 2 V
Lead Channel Setting Pacing Pulse Width: 0.4 ms
Lead Channel Setting Sensing Sensitivity: 0.3 mV

## 2020-05-01 NOTE — Patient Instructions (Signed)
Medication Instructions:   Your physician recommends that you continue on your current medications as directed. Please refer to the Current Medication list given to you today. *If you need a refill on your cardiac medications before your next appointment, please call your pharmacy*   Lab Work: [place]   If you have labs (blood work) drawn today and your tests are completely normal, you will receive your results only by: Marland Kitchen MyChart Message (if you have MyChart) OR . A paper copy in the mail If you have any lab test that is abnormal or we need to change your treatment, we will call you to review the results.   Testing/Procedures: NONE ORDERED  TODAY    Follow-Up: At [facility], you and your health needs are our priority.  As part of our continuing mission to provide you with exceptional heart care, we have created designated Provider Care Teams.  These Care Teams include your primary Cardiologist (physician) and Advanced Practice Providers (APPs -  Physician Assistants and Nurse Practitioners) who all work together to provide you with the care you need, when you need it.  We recommend signing up for the patient portal called "MyChart".  Sign up information is provided on this After Visit Summary.  MyChart is used to connect with patients for Virtual Visits (Telemedicine).  Patients are able to view lab/test results, encounter notes, upcoming appointments, etc.  Non-urgent messages can be sent to your provider as well.   To learn more about what you can do with MyChart, go to [email or web address].    Your next appointment:  SOME WILL CONTACT YOU BACK ONCE ITS TIME TO SCHEDULE FOR DEVICE BATTERY TO BE CHANGED  Other Instructions

## 2020-05-03 LAB — CUP PACEART REMOTE DEVICE CHECK
Battery Voltage: 2.62 V
Brady Statistic RV Percent Paced: 0 %
Date Time Interrogation Session: 20220310142707
HighPow Impedance: 285 Ohm
HighPow Impedance: 59 Ohm
Implantable Lead Implant Date: 20130307
Implantable Lead Location: 753860
Implantable Lead Model: 6935
Implantable Pulse Generator Implant Date: 20130307
Lead Channel Impedance Value: 361 Ohm
Lead Channel Pacing Threshold Amplitude: 0.75 V
Lead Channel Pacing Threshold Pulse Width: 0.4 ms
Lead Channel Sensing Intrinsic Amplitude: 9.375 mV
Lead Channel Sensing Intrinsic Amplitude: 9.375 mV
Lead Channel Setting Pacing Amplitude: 2 V
Lead Channel Setting Pacing Pulse Width: 0.4 ms
Lead Channel Setting Sensing Sensitivity: 0.3 mV

## 2020-05-09 NOTE — Progress Notes (Signed)
Remote pacemaker transmission.   

## 2020-05-09 NOTE — Addendum Note (Signed)
Addended by: Douglass Rivers D on: 05/09/2020 12:17 PM   Modules accepted: Level of Service

## 2020-05-24 ENCOUNTER — Ambulatory Visit: Payer: Commercial Managed Care - PPO | Admitting: Cardiology

## 2020-06-03 ENCOUNTER — Ambulatory Visit (INDEPENDENT_AMBULATORY_CARE_PROVIDER_SITE_OTHER): Payer: Commercial Managed Care - PPO

## 2020-06-03 DIAGNOSIS — I255 Ischemic cardiomyopathy: Secondary | ICD-10-CM

## 2020-06-04 ENCOUNTER — Other Ambulatory Visit: Payer: Self-pay | Admitting: Cardiology

## 2020-06-04 NOTE — Telephone Encounter (Signed)
Atorvastatin, Plavix and Carvedilol approved and sent

## 2020-06-05 LAB — CUP PACEART REMOTE DEVICE CHECK
Battery Voltage: 2.63 V
Brady Statistic RV Percent Paced: 0 %
Date Time Interrogation Session: 20220411193727
HighPow Impedance: 285 Ohm
HighPow Impedance: 67 Ohm
Implantable Lead Implant Date: 20130307
Implantable Lead Location: 753860
Implantable Lead Model: 6935
Implantable Pulse Generator Implant Date: 20130307
Lead Channel Impedance Value: 399 Ohm
Lead Channel Pacing Threshold Amplitude: 0.625 V
Lead Channel Pacing Threshold Pulse Width: 0.4 ms
Lead Channel Sensing Intrinsic Amplitude: 11.625 mV
Lead Channel Sensing Intrinsic Amplitude: 11.625 mV
Lead Channel Setting Pacing Amplitude: 2 V
Lead Channel Setting Pacing Pulse Width: 0.4 ms
Lead Channel Setting Sensing Sensitivity: 0.3 mV

## 2020-06-17 NOTE — Addendum Note (Signed)
Addended by: Douglass Rivers D on: 06/17/2020 04:41 PM   Modules accepted: Level of Service

## 2020-06-17 NOTE — Progress Notes (Signed)
Remote pacemaker transmission.   

## 2020-06-18 ENCOUNTER — Other Ambulatory Visit: Payer: Self-pay

## 2020-06-18 ENCOUNTER — Encounter: Payer: Self-pay | Admitting: Cardiology

## 2020-06-18 ENCOUNTER — Ambulatory Visit (INDEPENDENT_AMBULATORY_CARE_PROVIDER_SITE_OTHER): Payer: Commercial Managed Care - PPO | Admitting: Cardiology

## 2020-06-18 VITALS — BP 94/62 | HR 68 | Ht 66.0 in | Wt 128.0 lb

## 2020-06-18 DIAGNOSIS — I6930 Unspecified sequelae of cerebral infarction: Secondary | ICD-10-CM

## 2020-06-18 DIAGNOSIS — I5022 Chronic systolic (congestive) heart failure: Secondary | ICD-10-CM

## 2020-06-18 DIAGNOSIS — I2510 Atherosclerotic heart disease of native coronary artery without angina pectoris: Secondary | ICD-10-CM

## 2020-06-18 DIAGNOSIS — I255 Ischemic cardiomyopathy: Secondary | ICD-10-CM

## 2020-06-18 DIAGNOSIS — E785 Hyperlipidemia, unspecified: Secondary | ICD-10-CM

## 2020-06-18 DIAGNOSIS — Z95810 Presence of automatic (implantable) cardiac defibrillator: Secondary | ICD-10-CM

## 2020-06-18 NOTE — Progress Notes (Signed)
Cardiology Office Note:    Date:  06/18/2020   ID:  [name], DOB Aug 17, 1955, MRN [id number]  PCP:  Street, Sharon Mt, MD  Cardiologist:  Jenne Campus, MD    Referring MD: Street, Sharon Mt, *   Chief Complaint  Patient presents with  . Follow-up  Doing fine  History of Present Illness:    [name] is a 65 y.o. male  past medical history significant for coronary artery disease in 2005 he suffered from acute anterior wall myocardial infarction he ended up having V. fib arrest that required defibrillation. Stent to proximal LAD was placed at that time. At that time he was found to have cardiomyopathy with diminished ejection fraction 30 to 35%, ICD was implanted which is a Medtronic device. He is a chronic smoker. He comes today to my office for follow-up Overall doing well described to have fatigue tiredness and weakness but otherwise doing well still works still unhappy about his job.  The issue is defibrillator present which reaching close to ERI.  He is concerned about it.  Denies have any chest pain tightness squeezing pressure burning chest.  Past Medical History:  Diagnosis Date  . 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 CAD (coronary artery disease)    s/p anterior wall MI 2005 with VF arrest at tha ttime s/p stent of prox LAD, with repeat intervention at that time for ruptured plque in RCA which had cleared so significantly that PCI was no longer contemplated   . CAD, NATIVE VESSEL 03/20/2008   Qualifier: Diagnosis of  By: Caryl Comes, MD, [facility], Mack Guise   . Chronic systolic congestive heart failure ([place]) 04/14/2017  . Coronary artery disease involving native coronary artery of native heart without angina pectoris 11/05/2014   s/p anterior wall MI 2005 with VF arrest at tha ttime s/p stent of prox LAD, with repeat intervention at that time for ruptured plque in RCA which had cleared so significantly that PCI was no longer contemplated   Formatting of this note might be different from the original. Status post anterior wall myocardial infarction stent to LAD in 2005  . Dyslipidemia 04/14/2017  . H/O heart artery stent 04/14/2017  . High cholesterol   . Hypertension   . Implantable cardioverter-defibrillator (ICD) in situ 03/20/2008   Qualifier: Diagnosis of  By: Caryl Comes, MD, [facility], Mack Guise   . Ischemic cardiomyopathy    EF 30-35% s/p ICD placement 2005. 6949 lead malfunction/device ERI s/p new lead & generator change (Medtronic) 04/2011.  Marland Kitchen Late effect of cerebrovascular accident (CVA) 09/01/2018  . Old MI (myocardial infarction) 04/14/2017    Past Surgical History:  Procedure Laterality Date  . APPENDECTOMY  ~ 1974  . CORONARY ANGIOPLASTY WITH STENT PLACEMENT  2005  . ICD placement  2005  . ICD replaced  04/30/11   ICD lead replaced; old ICD removed; new ICD placed  . LEAD REVISION N/A 04/30/2011   Procedure: LEAD REVISION;  Surgeon: Evans Lance, MD;  Location: [facility] CATH LAB;  Service: Cardiovascular;  Laterality: N/A;  . NASAL SEPTUM SURGERY  1988    Current Medications: Current Meds  Medication Sig  . albuterol (VENTOLIN HFA) 108 (90 Base) MCG/ACT inhaler Inhale 2 puffs into the lungs every 4 (four) hours as needed for wheezing or shortness of breath.  Marland Kitchen aspirin 81 MG tablet Take 81 mg by mouth daily.  Marland Kitchen atorvastatin (LIPITOR) 40 MG tablet TAKE 1 TABLET BY MOUTH EVERY MORNING (Patient  taking differently: Take 40 mg by mouth daily.)  . carvedilol (COREG) 25 MG tablet TAKE 1 TABLET BY MOUTH TWICE DAILY WITH A MEAL (Patient taking differently: Take 25 mg by mouth 2 (two) times daily with a meal.)  . clopidogrel (PLAVIX) 75 MG tablet TAKE 1 TABLET BY MOUTH EVERY MORNING (Patient taking differently: Take 75 mg by mouth daily.)  . Coenzyme Q10 (COQ10 PO) Take 1 capsule by mouth every morning. Unknown strength  . ENTRESTO 24-26 MG TAKE 1 TABLET BY MOUTH TWICE DAILY (Patient taking differently: Take 1 tablet by mouth 2 (two)  times daily.)  . hydrochlorothiazide (HYDRODIURIL) 25 MG tablet TAKE 1 TABLET(25 MG) BY MOUTH DAILY (Patient taking differently: Take 25 mg by mouth daily.)  . nicotine (NICODERM CQ - DOSED IN MG/24 HOURS) 21 mg/24hr patch Place 21 mg onto the skin daily.  . nitroGLYCERIN (NITROSTAT) 0.4 MG SL tablet Place 1 tablet (0.4 mg total) under the tongue every 5 (five) minutes as needed. (Patient taking differently: Place 0.4 mg under the tongue every 5 (five) minutes as needed for chest pain.)  . Omega-3 Fatty Acids (FISH OIL PO) Take 1 capsule by mouth 2 (two) times daily.  . Red Yeast Rice Extract (RED YEAST RICE PO) Take 1 capsule by mouth 2 (two) times daily. Unknown strength  . [DISCONTINUED] ADVAIR DISKUS 500-50 MCG/DOSE AEPB Inhale 1 puff into the lungs 2 (two) times daily.     Allergies:   Spironolactone, Sulfasalazine, Sulfonamide derivatives, and Codeine   Social History   Socioeconomic History  . Marital status: Married    Spouse name: Not on file  . Number of children: Not on file  . Years of education: Not on file  . Highest education level: Not on file  Occupational History  . Not on file  Tobacco Use  . Smoking status: Former Smoker    Packs/day: 0.25    Years: 30.00    Pack years: 7.50    Types: Cigarettes    Quit date: 09/25/2019    Years since quitting: 0.7  . Smokeless tobacco: Never Used  . Tobacco comment: currently wearing a patch  Substance and Sexual Activity  . Alcohol use: Yes    Comment: 04/30/11 "2-3 beer q other weekend"  . Drug use: No  . Sexual activity: Yes  Other Topics Concern  . Not on file  Social History Narrative  . Not on file   Social Determinants of Health   Financial Resource Strain: Not on file  Food Insecurity: Not on file  Transportation Needs: Not on file  Physical Activity: Not on file  Stress: Not on file  Social Connections: Not on file     Family History: The patient's family history includes Heart attack in his father. ROS:    Please see the history of present illness.    All 14 point review of systems negative except as described per history of present illness  EKGs/Labs/Other Studies Reviewed:      Recent Labs: No results found for requested labs within last 8760 hours.  Recent Lipid Panel    Component Value Date/Time   CHOL 135 10/26/2019 0935   TRIG 82 10/26/2019 0935   HDL 32 (L) 10/26/2019 0935   CHOLHDL 4.2 10/26/2019 0935   LDLCALC 87 10/26/2019 0935    Physical Exam:    VS:  BP 94/62 (BP Location: Left Arm, Patient Position: Sitting)   Pulse 68   Ht 5\' 6"  (1.676 m)   Wt 128 lb (58.1  kg)   SpO2 98%   BMI 20.66 kg/m     Wt Readings from Last 3 Encounters:  06/18/20 128 lb (58.1 kg)  05/01/20 128 lb 3.2 oz (58.2 kg)  10/26/19 135 lb (61.2 kg)     GEN:  Well nourished, well developed in no acute distress HEENT: Normal NECK: No JVD; No carotid bruits LYMPHATICS: No lymphadenopathy CARDIAC: RRR, no murmurs, no rubs, no gallops RESPIRATORY:  Clear to auscultation without rales, wheezing or rhonchi  ABDOMEN: Soft, non-tender, non-distended MUSCULOSKELETAL:  No edema; No deformity  SKIN: Warm and dry LOWER EXTREMITIES: no swelling NEUROLOGIC:  Alert and oriented x 3 PSYCHIATRIC:  Normal affect   ASSESSMENT:    1. Ischemic cardiomyopathy   2. Coronary artery disease involving native coronary artery of native heart without angina pectoris   3. Chronic systolic congestive heart failure ([place])   4. Late effect of cerebrovascular accident (CVA)   5. Dyslipidemia   6. Implantable cardioverter-defibrillator (ICD) in situ    PLAN:    In order of problems listed above:  1. Ischemic cardiomyopathy on appropriate medication that he can tolerate the difficulties his blood pressure being low.  We will continue present management. 2. Coronary disease stable now with present problem we will continue present management. 3. Dyslipidemia, on atorvastatin 40 which I will continue, we will make  arrangements for fasting ibuprofen to be done. 4. ICD present, I did review interrogation battery voltage this is exactly at elective replacement indicator.  He had does have appointment with our EP team next month in [place] however he prefers to be seen in [place] we will make arrangements for that.   Medication Adjustments/Labs and Tests Ordered: Current medicines are reviewed at length with the patient today.  Concerns regarding medicines are outlined above.  No orders of the defined types were placed in this encounter.  Medication changes: No orders of the defined types were placed in this encounter.   Signed, Park Liter, MD, [facility] 06/18/2020 10:55 AM    [place]

## 2020-06-18 NOTE — Patient Instructions (Signed)

## 2020-06-18 NOTE — Progress Notes (Signed)
Kg   

## 2020-07-01 ENCOUNTER — Ambulatory Visit (INDEPENDENT_AMBULATORY_CARE_PROVIDER_SITE_OTHER): Payer: Self-pay

## 2020-07-01 DIAGNOSIS — I255 Ischemic cardiomyopathy: Secondary | ICD-10-CM

## 2020-07-02 LAB — CUP PACEART REMOTE DEVICE CHECK
Battery Voltage: 2.62 V
Brady Statistic RV Percent Paced: 0.01 %
Date Time Interrogation Session: 20220509112605
HighPow Impedance: 304 Ohm
HighPow Impedance: 69 Ohm
Implantable Lead Implant Date: 20130307
Implantable Lead Location: 753860
Implantable Lead Model: 6935
Implantable Pulse Generator Implant Date: 20130307
Lead Channel Impedance Value: 418 Ohm
Lead Channel Pacing Threshold Amplitude: 0.875 V
Lead Channel Pacing Threshold Pulse Width: 0.4 ms
Lead Channel Sensing Intrinsic Amplitude: 11.25 mV
Lead Channel Sensing Intrinsic Amplitude: 11.25 mV
Lead Channel Setting Pacing Amplitude: 2 V
Lead Channel Setting Pacing Pulse Width: 0.4 ms
Lead Channel Setting Sensing Sensitivity: 0.3 mV

## 2020-07-09 ENCOUNTER — Encounter: Payer: Commercial Managed Care - PPO | Admitting: Cardiology

## 2020-07-19 NOTE — Progress Notes (Signed)
Remote pacemaker transmission.   

## 2020-07-19 NOTE — Addendum Note (Signed)
Addended by: Douglass Rivers D on: 07/19/2020 09:18 AM   Modules accepted: Level of Service

## 2020-07-30 ENCOUNTER — Telehealth: Payer: Self-pay

## 2020-07-30 NOTE — Telephone Encounter (Signed)
Pt left a voice message because he has a question about his device. At work yesterday he had a magnet near his device most of the day. He wants to know will the magnet turn his device off? He is requesting the nurse give him a call at [phone].

## 2020-07-31 NOTE — Telephone Encounter (Signed)
Attempted to return pt phone call, no answer.  LVM with DC # and hours to return phone call.

## 2020-08-01 NOTE — Telephone Encounter (Signed)
2nd attempt to reach patient, no answer.  LVM for pt to call back.

## 2020-08-02 ENCOUNTER — Other Ambulatory Visit: Payer: Self-pay | Admitting: Cardiology

## 2020-08-05 ENCOUNTER — Ambulatory Visit (INDEPENDENT_AMBULATORY_CARE_PROVIDER_SITE_OTHER): Payer: Commercial Managed Care - PPO

## 2020-08-05 DIAGNOSIS — I5022 Chronic systolic (congestive) heart failure: Secondary | ICD-10-CM

## 2020-08-05 DIAGNOSIS — I255 Ischemic cardiomyopathy: Secondary | ICD-10-CM

## 2020-08-06 LAB — CUP PACEART REMOTE DEVICE CHECK
Battery Voltage: 2.62 V
Brady Statistic RV Percent Paced: 0.01 %
Date Time Interrogation Session: 20220613012306
HighPow Impedance: 285 Ohm
HighPow Impedance: 64 Ohm
Implantable Lead Implant Date: 20130307
Implantable Lead Location: 753860
Implantable Lead Model: 6935
Implantable Pulse Generator Implant Date: 20130307
Lead Channel Impedance Value: 399 Ohm
Lead Channel Pacing Threshold Amplitude: 0.75 V
Lead Channel Pacing Threshold Pulse Width: 0.4 ms
Lead Channel Sensing Intrinsic Amplitude: 12 mV
Lead Channel Sensing Intrinsic Amplitude: 12 mV
Lead Channel Setting Pacing Amplitude: 2 V
Lead Channel Setting Pacing Pulse Width: 0.4 ms
Lead Channel Setting Sensing Sensitivity: 0.3 mV

## 2020-08-06 NOTE — Telephone Encounter (Signed)
3rd attempt to contact patient. No answer, LMTCB.  Certified letter sent.

## 2020-08-12 ENCOUNTER — Encounter: Payer: Self-pay | Admitting: Cardiology

## 2020-08-12 ENCOUNTER — Other Ambulatory Visit: Payer: Self-pay

## 2020-08-12 ENCOUNTER — Other Ambulatory Visit: Payer: Self-pay | Admitting: Cardiology

## 2020-08-12 ENCOUNTER — Ambulatory Visit (INDEPENDENT_AMBULATORY_CARE_PROVIDER_SITE_OTHER): Payer: Commercial Managed Care - PPO | Admitting: Cardiology

## 2020-08-12 VITALS — BP 113/75 | HR 70 | Ht 66.0 in | Wt 130.0 lb

## 2020-08-12 DIAGNOSIS — I255 Ischemic cardiomyopathy: Secondary | ICD-10-CM | POA: Diagnosis not present

## 2020-08-12 DIAGNOSIS — Z01818 Encounter for other preprocedural examination: Secondary | ICD-10-CM

## 2020-08-12 DIAGNOSIS — Z01812 Encounter for preprocedural laboratory examination: Secondary | ICD-10-CM

## 2020-08-12 NOTE — Telephone Encounter (Signed)
Pt left a message for the nurse to give him a call back. His phone number is [phone].

## 2020-08-12 NOTE — H&P (View-Only) (Signed)
Electrophysiology Office Note   Date:  08/12/2020   ID:  [name], [name] [birth year], MRN [id number]  PCP:  Street, Sharon Mt, MD  Cardiologist:  [name] Primary Electrophysiologist:  [name] Bensch Meredith Leeds, MD    No chief complaint on file.    History of Present Illness: [name] is a 65 y.o. male who is being seen today for the evaluation of ischemic cardiomyopathy at the request of [name], *. Presenting today for electrophysiology evaluation.    He has a history of coronary artery disease, hypertension, hyperlipidemia, ischemic cardiomyopathy status post Medtronic ICD.  His device was implanted in 2005.  He has had a generator change with an RV lead addition and a capped 6949-lead.  Today, denies symptoms of palpitations, chest pain, shortness of breath, orthopnea, PND, lower extremity edema, claudication, dizziness, presyncope, syncope, bleeding, or neurologic sequela. The patient is tolerating medications without difficulties.  Since being seen he has done well.  He has no chest pain or shortness of breath.  Is able to do all of his daily activities without restriction.  He continues to work and has no issues with exerting himself.   Past Medical History:  Diagnosis Date   Basal cell epithelioma    "under my left eye"   CAD (coronary artery disease)    s/p anterior wall MI 2005 with VF arrest at tha ttime s/p stent of prox LAD, with repeat intervention at that time for ruptured plque in RCA which had cleared so significantly that PCI was no longer contemplated    CAD, NATIVE VESSEL 03/20/2008   Qualifier: Diagnosis of  By: Caryl Comes, MD, Remus Blake    Chronic systolic congestive heart failure ([place]) 04/14/2017   Coronary artery disease involving native coronary artery of native heart without angina pectoris 11/05/2014   s/p anterior wall MI 2005 with VF arrest at tha ttime s/p stent of prox LAD, with repeat intervention at that time for  ruptured plque in RCA which had cleared so significantly that PCI was no longer contemplated  Formatting of this note might be different from the original. Status post anterior wall myocardial infarction stent to LAD in 2005   Dyslipidemia 04/14/2017   H/O heart artery stent 04/14/2017   High cholesterol    Hypertension    Implantable cardioverter-defibrillator (ICD) in situ 03/20/2008   Qualifier: Diagnosis of  By: Caryl Comes, MD, Remus Blake    Ischemic cardiomyopathy    EF 30-35% s/p ICD placement 2005. 6949 lead malfunction/device ERI s/p new lead & generator change (Medtronic) 04/2011.   Late effect of cerebrovascular accident (CVA) 09/01/2018   Old MI (myocardial infarction) 04/14/2017   Past Surgical History:  Procedure Laterality Date   APPENDECTOMY  ~ [place]  2005   ICD placement  2005   ICD replaced  04/30/11   ICD lead replaced; old ICD removed; new ICD placed   LEAD REVISION N/A 04/30/2011   Procedure: LEAD REVISION;  Surgeon: [name] Lance, MD;  [facility] CATH LAB;  Service: Cardiovascular;  Laterality: N/A;   NASAL SEPTUM SURGERY  1988     Current Outpatient Medications  Medication Sig Dispense Refill   albuterol (VENTOLIN HFA) 108 (90 Base) MCG/ACT inhaler Inhale 2 puffs into the lungs every 4 (four) hours as needed for wheezing or shortness of breath.     aspirin 81 MG tablet Take 81 mg by mouth daily.     atorvastatin (LIPITOR) 40  MG tablet TAKE 1 TABLET BY MOUTH EVERY MORNING (Patient taking differently: Take 40 mg by mouth daily.) 90 tablet 1   carvedilol (COREG) 25 MG tablet TAKE 1 TABLET BY MOUTH TWICE DAILY WITH A MEAL (Patient taking differently: Take 25 mg by mouth 2 (two) times daily with a meal.) 180 tablet 2   clopidogrel (PLAVIX) 75 MG tablet TAKE 1 TABLET BY MOUTH EVERY MORNING (Patient taking differently: Take 75 mg by mouth daily.) 90 tablet 2   Coenzyme Q10 (COQ10 PO) Take 1 capsule by mouth every morning. Unknown  strength     hydrochlorothiazide (HYDRODIURIL) 25 MG tablet TAKE 1 TABLET(25 MG) BY MOUTH DAILY (Patient taking differently: Take 25 mg by mouth daily.) 90 tablet 1   nicotine (NICODERM CQ - DOSED IN MG/24 HOURS) 21 mg/24hr patch Place 21 mg onto the skin daily.     nitroGLYCERIN (NITROSTAT) 0.4 MG SL tablet Place 1 tablet (0.4 mg total) under the tongue every 5 (five) minutes as needed. (Patient taking differently: Place 0.4 mg under the tongue every 5 (five) minutes as needed for chest pain.) 25 tablet 6   Omega-3 Fatty Acids (FISH OIL PO) Take 1 capsule by mouth 2 (two) times daily.     Red Yeast Rice Extract (RED YEAST RICE PO) Take 1 capsule by mouth 2 (two) times daily. Unknown strength     sacubitril-valsartan (ENTRESTO) 24-26 MG Take 1 tablet by mouth 2 (two) times daily. 180 tablet 2   No current facility-administered medications for this visit.    Allergies:   Spironolactone, Sulfasalazine, Sulfonamide derivatives, and Codeine   Social History:  The patient  reports that he quit smoking about 10 months ago. His smoking use included cigarettes. He has a 7.50 pack-year smoking history. He has never used smokeless tobacco. He reports current alcohol use. He reports that he does not use drugs.   Family History:  The patient's family history includes Heart attack in his father.   ROS:  Please see the history of present illness.   Otherwise, review of systems is positive for none.   All other systems are reviewed and negative.   PHYSICAL EXAM: VS:  BP 113/75   Pulse 70   Ht 5\' 6"  (1.676 m)   Wt 130 lb (59 kg)   BMI 20.98 kg/m  , BMI Body mass index is 20.98 kg/m. GEN: Well nourished, well developed, in no acute distress  HEENT: normal  Neck: no JVD, carotid bruits, or masses Cardiac: RRR; no murmurs, rubs, or gallops,no edema  Respiratory:  clear to auscultation bilaterally, normal work of breathing GI: soft, nontender, nondistended, + BS MS: no deformity or atrophy  Skin: warm  and dry, device site well healed Neuro:  Strength and sensation are intact Psych: euthymic mood, full affect  EKG:  EKG is ordered today. Personal review of the ekg ordered shows sinus rhythm, rate 70  Personal review of the device interrogation today. Results in [place]: No results found for requested labs within last 8760 hours.    Lipid Panel     Component Value Date/Time   CHOL 135 10/26/2019 0935   TRIG 82 10/26/2019 0935   HDL 32 (L) 10/26/2019 0935   CHOLHDL 4.2 10/26/2019 0935   LDLCALC 87 10/26/2019 0935     Wt Readings from Last 3 Encounters:  08/12/20 130 lb (59 kg)  06/18/20 128 lb (58.1 kg)  05/01/20 128 lb 3.2 oz (58.2 kg)  Other studies Reviewed: Additional studies/ records that were reviewed today include: TTE 12/14/17  Review of the above records today demonstrates:  - Left ventricle: The cavity size was normal. Wall thickness was   normal. Systolic function was normal. Akinesis of the   mid-apicalanteroseptal, anterior, inferoseptal, and apical   myocardium. Doppler parameters are consistent with abnormal left   ventricular relaxation (grade 1 diastolic dysfunction). - Aortic valve: There was trivial regurgitation.   ASSESSMENT AND PLAN:  1.  Ischemic cardiomyopathy: Status post Medtronic ICD.  Currently on optimal medical therapy.  Has a 6949-lead.  Device functioning appropriately.  Device is reached RRT.  We Lyzette Reinhardt plan for generator change.  Risk and benefits of been discussed.  Risk include bleeding and infection.  The patient understands these risks and is agreed to the procedure.  2.  Coronary artery disease: No current chest pain  3.  Hyperlipidemia: Continue Lipitor with a goal LDL of 70  4.  Tobacco abuse: Cessation encouraged    Current medicines are reviewed at length with the patient today.   The patient does not have concerns regarding his medicines.  The following changes were made today: None  Labs/ tests  ordered today include:  Orders Placed This Encounter  Procedures   Basic metabolic panel   CBC   EKG 12-Lead      Disposition:   FU with Atha Muradyan 3 months  Signed, Merrill Deanda Meredith Leeds, MD  08/12/2020 3:29 PM     [facility] [address] [place] [address] [phone] (office) [phone] (fax)

## 2020-08-12 NOTE — Progress Notes (Signed)
Electrophysiology Office Note   Date:  08/12/2020   ID:  Rubin, Dais 1955-05-07, MRN [id number]  PCP:  Street, Sharon Mt, MD  Cardiologist:  Agustin Cree Primary Electrophysiologist:  Clayborne Divis Meredith Leeds, MD    No chief complaint on file.    History of Present Illness: [name] is a 65 y.o. male who is being seen today for the evaluation of ischemic cardiomyopathy at the request of Street, Sharon Mt, *. Presenting today for electrophysiology evaluation.    He has a history of coronary artery disease, hypertension, hyperlipidemia, ischemic cardiomyopathy status post Medtronic ICD.  His device was implanted in 2005.  He has had a generator change with an RV lead addition and a capped 6949-lead.  Today, denies symptoms of palpitations, chest pain, shortness of breath, orthopnea, PND, lower extremity edema, claudication, dizziness, presyncope, syncope, bleeding, or neurologic sequela. The patient is tolerating medications without difficulties.  Since being seen he has done well.  He has no chest pain or shortness of breath.  Is able to do all of his daily activities without restriction.  He continues to work and has no issues with exerting himself.   Past Medical History:  Diagnosis Date   Basal cell epithelioma    "under my left eye"   CAD (coronary artery disease)    s/p anterior wall MI 2005 with VF arrest at tha ttime s/p stent of prox LAD, with repeat intervention at that time for ruptured plque in RCA which had cleared so significantly that PCI was no longer contemplated    CAD, NATIVE VESSEL 03/20/2008   Qualifier: Diagnosis of  By: Caryl Comes, MD, Remus Blake    Chronic systolic congestive heart failure ([place]) 04/14/2017   Coronary artery disease involving native coronary artery of native heart without angina pectoris 11/05/2014   s/p anterior wall MI 2005 with VF arrest at tha ttime s/p stent of prox LAD, with repeat intervention at that time for  ruptured plque in RCA which had cleared so significantly that PCI was no longer contemplated  Formatting of this note might be different from the original. Status post anterior wall myocardial infarction stent to LAD in 2005   Dyslipidemia 04/14/2017   H/O heart artery stent 04/14/2017   High cholesterol    Hypertension    Implantable cardioverter-defibrillator (ICD) in situ 03/20/2008   Qualifier: Diagnosis of  By: Caryl Comes, MD, Remus Blake    Ischemic cardiomyopathy    EF 30-35% s/p ICD placement 2005. 6949 lead malfunction/device ERI s/p new lead & generator change (Medtronic) 04/2011.   Late effect of cerebrovascular accident (CVA) 09/01/2018   Old MI (myocardial infarction) 04/14/2017   Past Surgical History:  Procedure Laterality Date   APPENDECTOMY  ~ [place]  2005   ICD placement  2005   ICD replaced  04/30/11   ICD lead replaced; old ICD removed; new ICD placed   LEAD REVISION N/A 04/30/2011   Procedure: LEAD REVISION;  Surgeon: Evans Lance, MD;  [facility] C[facility] LAB;  Service: Cardiovascular;  Laterality: N/A;   NASAL SEPTUM SURGERY  1988     Current Outpatient Medications  Medication Sig Dispense Refill   albuterol (VENTOLIN HFA) 108 (90 Base) MCG/ACT inhaler Inhale 2 puffs into the lungs every 4 (four) hours as needed for wheezing or shortness of breath.     aspirin 81 MG tablet Take 81 mg by mouth daily.     atorvastatin (LIPITOR) 40  MG tablet TAKE 1 TABLET BY MOUTH EVERY MORNING (Patient taking differently: Take 40 mg by mouth daily.) 90 tablet 1   carvedilol (COREG) 25 MG tablet TAKE 1 TABLET BY MOUTH TWICE DAILY WITH A MEAL (Patient taking differently: Take 25 mg by mouth 2 (two) times daily with a meal.) 180 tablet 2   clopidogrel (PLAVIX) 75 MG tablet TAKE 1 TABLET BY MOUTH EVERY MORNING (Patient taking differently: Take 75 mg by mouth daily.) 90 tablet 2   Coenzyme Q10 (COQ10 PO) Take 1 capsule by mouth every morning. Unknown  strength     hydrochlorothiazide (HYDRODIURIL) 25 MG tablet TAKE 1 TABLET(25 MG) BY MOUTH DAILY (Patient taking differently: Take 25 mg by mouth daily.) 90 tablet 1   nicotine (NICODERM CQ - DOSED IN MG/24 HOURS) 21 mg/24hr patch Place 21 mg onto the skin daily.     nitroGLYCERIN (NITROSTAT) 0.4 MG SL tablet Place 1 tablet (0.4 mg total) under the tongue every 5 (five) minutes as needed. (Patient taking differently: Place 0.4 mg under the tongue every 5 (five) minutes as needed for chest pain.) 25 tablet 6   Omega-3 Fatty Acids (FISH OIL PO) Take 1 capsule by mouth 2 (two) times daily.     Red Yeast Rice Extract (RED YEAST RICE PO) Take 1 capsule by mouth 2 (two) times daily. Unknown strength     sacubitril-valsartan (ENTRESTO) 24-26 MG Take 1 tablet by mouth 2 (two) times daily. 180 tablet 2   No current facility-administered medications for this visit.    Allergies:   Spironolactone, Sulfasalazine, Sulfonamide derivatives, and Codeine   Social History:  The patient  reports that he quit smoking about 10 months ago. His smoking use included cigarettes. He has a 7.50 pack-year smoking history. He has never used smokeless tobacco. He reports current alcohol use. He reports that he does not use drugs.   Family History:  The patient's family history includes Heart attack in his father.   ROS:  Please see the history of present illness.   Otherwise, review of systems is positive for none.   All other systems are reviewed and negative.   PHYSICAL EXAM: VS:  BP 113/75   Pulse 70   Ht 5\' 6"  (1.676 m)   Wt 130 lb (59 kg)   BMI 20.98 kg/m  , BMI Body mass index is 20.98 kg/m. GEN: Well nourished, well developed, in no acute distress  HEENT: normal  Neck: no JVD, carotid bruits, or masses Cardiac: RRR; no murmurs, rubs, or gallops,no edema  Respiratory:  clear to auscultation bilaterally, normal work of breathing GI: soft, nontender, nondistended, + BS MS: no deformity or atrophy  Skin: warm  and dry, device site well healed Neuro:  Strength and sensation are intact Psych: euthymic mood, full affect  EKG:  EKG is ordered today. Personal review of the ekg ordered shows sinus rhythm, rate 70  Personal review of the device interrogation today. Results in [place]: No results found for requested labs within last 8760 hours.    Lipid Panel     Component Value Date/Time   CHOL 135 10/26/2019 0935   TRIG 82 10/26/2019 0935   HDL 32 (L) 10/26/2019 0935   CHOLHDL 4.2 10/26/2019 0935   LDLCALC 87 10/26/2019 0935     Wt Readings from Last 3 Encounters:  08/12/20 130 lb (59 kg)  06/18/20 128 lb (58.1 kg)  05/01/20 128 lb 3.2 oz (58.2 kg)  Other studies Reviewed: Additional studies/ records that were reviewed today include: TTE 12/14/17  Review of the above records today demonstrates:  - Left ventricle: The cavity size was normal. Wall thickness was   normal. Systolic function was normal. Akinesis of the   mid-apicalanteroseptal, anterior, inferoseptal, and apical   myocardium. Doppler parameters are consistent with abnormal left   ventricular relaxation (grade 1 diastolic dysfunction). - Aortic valve: There was trivial regurgitation.   ASSESSMENT AND PLAN:  1.  Ischemic cardiomyopathy: Status post Medtronic ICD.  Currently on optimal medical therapy.  Has a 6949-lead.  Device functioning appropriately.  Device is reached RRT.  We Kailany Dinunzio plan for generator change.  Risk and benefits of been discussed.  Risk include bleeding and infection.  The patient understands these risks and is agreed to the procedure.  2.  Coronary artery disease: No current chest pain  3.  Hyperlipidemia: Continue Lipitor with a goal LDL of 70  4.  Tobacco abuse: Cessation encouraged    Current medicines are reviewed at length with the patient today.   The patient does not have concerns regarding his medicines.  The following changes were made today: None  Labs/ tests  ordered today include:  Orders Placed This Encounter  Procedures   Basic metabolic panel   CBC   EKG 12-Lead      Disposition:   FU with Zakiyah Diop 3 months  Signed, Kateena Degroote Meredith Leeds, MD  08/12/2020 3:29 PM     [facility] [address] [place] [address] [phone] (office) [phone] (fax)

## 2020-08-12 NOTE — Patient Instructions (Signed)
Medication Instructions:  Your physician recommends that you continue on your current medications as directed. Please refer to the Current Medication list given to you today.  *If you need a refill on your cardiac medications before your next appointment, please call your pharmacy*   Lab Work: Pre procedure labs today: BMET & CBC If you have labs (blood work) drawn today and your tests are completely normal, you will receive your results only by: [place] (if you have MyChart) OR A paper copy in the mail If you have any lab test that is abnormal or we need to change your treatment, we will call you to review the results.   Testing/Procedures: Your physician has recommended you have a defibrillator battery change.  Please see the instructions below located under "other instructions.   Follow-Up: At [facility], you and your health needs are our priority.  As part of our continuing mission to provide you with exceptional heart care, we have created designated Provider Care Teams.  These Care Teams include your primary Cardiologist (physician) and Advanced Practice Providers (APPs -  Physician Assistants and Nurse Practitioners) who all work together to provide you with the care you need, when you need it.  Your next appointment:   2 week(s) after your defibrillator battery change out  The format for your next appointment:   In Person  Provider:   Device clinic for a wound check    Thank you for choosing [facility]!!   Trinidad Curet, RN [phone]   Other Instructions    Implantable Device Instructions  You are scheduled for: Defibrillator battery change on 09/09/2020 with Dr. Curt Bears.  1.   Pre procedure testing-             A.  LAB WORK--- On 08/12/2020  for your pre procedure blood work.  You do NOT need to be fasting.  On the day of your procedure 09/09/2020 you will go to [facility] [phone] [place]) at 10:30 am.  Dennis Bast will go to the main  entrance A [employer]) and enter where the [employer] are.  You will check in at ADMITTING.  You may have one support person come in to the hospital with you.  They will be asked to wait in the waiting room.   3.   Do not eat or drink after midnight prior to your procedure.   4.   On the morning of your procedure do NOT take any medication.  5.  The night before your procedure and the morning of your procedure scrub your neck/chest with surgical scrub.  See instruction letter.   5.  Plan for an overnight stay, but you may be discharged home after your procedure. If you use your phone frequently bring your phone charger, in case you have to stay.  If you are discharged after your procedure you will need someone to drive you home and be with your for 24 hours after your procedure.   6.  You will follow up with the [place] clinic 10-14 days after your procedure. You will follow up with Dr. Curt Bears 91 days after your procedure.  These appointments will be made for you.  7. FYI: For your safety, and to allow [place] to monitor your vital signs accurately during the surgery/procedure we request that if you have artificial nails, gel coating, SNS etc. Please have those removed prior to your surgery/procedure. Not having the nail coverings /polish removed may result in  cancellation or delay of your surgery/procedure.   * If you have ANY questions after you get home, please call the office [phone] and ask for Antionne Enrique RN or send a MyChart message.    [place] - Preparing For Surgery (surgical scrub)  Before surgery, you can play an important role. Because skin is not sterile, your skin needs to be as free of germs as possible. You can reduce the number of germs on your skin by washing with CHG (chlorahexidine gluconate) Soap before surgery.  CHG is an antiseptic cleaner which kills germs and bonds with the skin to continue killing germs even after washing.   Please do not use if  you have an allergy to CHG or antibacterial soaps.  If your skin becomes reddened/irritated stop using the CHG.   Do not shave (including legs and underarms) for at least 48 hours prior to first CHG shower.  It is OK to shave your face.  Please follow these instructions carefully:  1.  Shower the night before surgery and the morning of surgery with CHG.  2.  If you choose to wash your hair, wash your hair first as usual with your normal shampoo.  3.  After you shampoo, rinse your hair and body thoroughly to remove the shampoo.  4.  Use CHG as you would any other liquid soap.  You can apply CHG directly to the skin and wash gently with a clean washcloth. 5.  Apply the CHG Soap to your body ONLY FROM THE NECK DOWN.  Do not use on open wounds or open sores.  Avoid contact with your eyes, ears, mouth and genitals (private parts).  Wash genitals (private parts) with your normal soap.  6.  Wash thoroughly, paying special attention to the area where your surgery will be performed.  7.  Thoroughly rinse your body with warm water from the neck down.   8.  DO NOT shower/wash with your normal soap after using and rinsing off the CHG soap.  9.  Pat yourself dry with a clean towel.           10.  Wear clean pajamas.           11.  Place clean sheets on your bed the night of your first shower and do not sleep with pets.  Day of Surgery: Do not apply any deodorants/lotions.  Please wear clean clothes to the hospital/surgery center.

## 2020-08-13 LAB — CBC
Hematocrit: 39.4 % (ref 37.5–51.0)
Hemoglobin: 13.4 g/dL (ref 13.0–17.7)
MCH: 30.5 pg (ref 26.6–33.0)
MCHC: 34 g/dL (ref 31.5–35.7)
MCV: 90 fL (ref 79–97)
Platelets: 180 10*3/uL (ref 150–450)
RBC: 4.39 x10E6/uL (ref 4.14–5.80)
RDW: 12.8 % (ref 11.6–15.4)
WBC: 5.6 10*3/uL (ref 3.4–10.8)

## 2020-08-13 LAB — BASIC METABOLIC PANEL
BUN/Creatinine Ratio: 12 (ref 10–24)
BUN: 15 mg/dL (ref 8–27)
CO2: 22 mmol/L (ref 20–29)
Calcium: 9.5 mg/dL (ref 8.6–10.2)
Chloride: 93 mmol/L — ABNORMAL LOW (ref 96–106)
Creatinine, Ser: 1.28 mg/dL — ABNORMAL HIGH (ref 0.76–1.27)
Glucose: 87 mg/dL (ref 65–99)
Potassium: 4.7 mmol/L (ref 3.5–5.2)
Sodium: 129 mmol/L — ABNORMAL LOW (ref 134–144)
eGFR: 63 mL/min/{1.73_m2} (ref >59)

## 2020-08-22 ENCOUNTER — Telehealth: Payer: Self-pay

## 2020-08-22 NOTE — Telephone Encounter (Signed)
Patient was seen in-clinic after this phone call was made. Gen change scheduled.

## 2020-08-22 NOTE — Telephone Encounter (Signed)
Attempted to contact patient for follow up about his question. No answer, LMTCB.

## 2020-08-23 NOTE — Addendum Note (Signed)
Addended by: Douglass Rivers D on: 08/23/2020 05:05 PM   Modules accepted: Level of Service

## 2020-08-23 NOTE — Progress Notes (Signed)
Remote pacemaker transmission.   

## 2020-08-29 ENCOUNTER — Telehealth: Payer: Self-pay | Admitting: *Deleted

## 2020-08-29 NOTE — Telephone Encounter (Signed)
Left message to call back  (Advise pt to hold Plavix 2 days prior to ICD generator change.  Take last Plavix dose 7/15.)

## 2020-08-30 NOTE — Telephone Encounter (Signed)
Left detailed message informing pt to hold Plavix 2 days prior to procedure, take last dose 7/15. Asked pt to call back and aware I will do my best to call back around 6pm but no guarantees.

## 2020-08-30 NOTE — Telephone Encounter (Signed)
Follow Up:     Patient is returning Sherri's call from yesterday. If she does not get him when she call, he will available at 6:00 today.

## 2020-09-01 ENCOUNTER — Other Ambulatory Visit: Payer: Self-pay | Admitting: Cardiology

## 2020-09-04 NOTE — Telephone Encounter (Signed)
Attempted to reach pt again, voicemail

## 2020-09-05 ENCOUNTER — Ambulatory Visit (INDEPENDENT_AMBULATORY_CARE_PROVIDER_SITE_OTHER): Payer: Commercial Managed Care - PPO

## 2020-09-05 DIAGNOSIS — I255 Ischemic cardiomyopathy: Secondary | ICD-10-CM

## 2020-09-05 LAB — CUP PACEART REMOTE DEVICE CHECK
Battery Voltage: 2.61 V
Brady Statistic RV Percent Paced: 0.01 %
Date Time Interrogation Session: 20220714163827
HighPow Impedance: 304 Ohm
HighPow Impedance: 70 Ohm
Implantable Lead Implant Date: 20130307
Implantable Lead Location: 753860
Implantable Lead Model: 6935
Implantable Pulse Generator Implant Date: 20130307
Lead Channel Impedance Value: 399 Ohm
Lead Channel Pacing Threshold Amplitude: 0.875 V
Lead Channel Pacing Threshold Pulse Width: 0.4 ms
Lead Channel Sensing Intrinsic Amplitude: 10.25 mV
Lead Channel Sensing Intrinsic Amplitude: 10.25 mV
Lead Channel Setting Pacing Amplitude: 2 V
Lead Channel Setting Pacing Pulse Width: 0.4 ms
Lead Channel Setting Sensing Sensitivity: 0.3 mV

## 2020-09-05 NOTE — Telephone Encounter (Signed)
Left detailed message on voicemail. Reminded pt to take last dose of Plavix tomorrow and to hold it over the w/e for upcoming procedure on Monday. Advised to call office with further questions.

## 2020-09-06 NOTE — Pre-Procedure Instructions (Signed)
Attempted to call patient regarding procedure instructions for Monday morning.  Left voice mail on the following items:  Instructed patient on the following items: Arrival time 1030 Nothing to eat or drink after midnight No meds AM of procedure Responsible person to drive you home and stay with you for 24 hrs Wash with special soap night before and morning of procedure If on anti-coagulant drug instructions Plavix- take doses today, hold Saturday and Sunday

## 2020-09-06 NOTE — Telephone Encounter (Signed)
Pt aware to hold Plavix. Pt aware to be at 8:30 on Monday (not 10:30 as last instructed) for procedure. Informed patient of results and verbal understanding expressed.

## 2020-09-09 ENCOUNTER — Other Ambulatory Visit: Payer: Self-pay

## 2020-09-09 ENCOUNTER — Encounter (HOSPITAL_COMMUNITY): Payer: Self-pay | Admitting: Cardiology

## 2020-09-09 ENCOUNTER — Encounter (HOSPITAL_COMMUNITY)
Admission: RE | Disposition: A | Discharge status: Home / Self Care | Payer: Self-pay | Source: Home / Self Care | Attending: Cardiology

## 2020-09-09 ENCOUNTER — Ambulatory Visit (HOSPITAL_COMMUNITY)
Admission: RE | Admit: 2020-09-09 | Discharge: 2020-09-09 | Disposition: A | Discharge status: Home / Self Care | Payer: Commercial Managed Care - PPO | Attending: Cardiology | Admitting: Cardiology

## 2020-09-09 DIAGNOSIS — E7800 Pure hypercholesterolemia, unspecified: Secondary | ICD-10-CM | POA: Insufficient documentation

## 2020-09-09 DIAGNOSIS — Z87891 Personal history of nicotine dependence: Secondary | ICD-10-CM | POA: Insufficient documentation

## 2020-09-09 DIAGNOSIS — I5022 Chronic systolic (congestive) heart failure: Secondary | ICD-10-CM | POA: Insufficient documentation

## 2020-09-09 DIAGNOSIS — Z882 Allergy status to sulfonamides status: Secondary | ICD-10-CM | POA: Insufficient documentation

## 2020-09-09 DIAGNOSIS — Z7982 Long term (current) use of aspirin: Secondary | ICD-10-CM | POA: Insufficient documentation

## 2020-09-09 DIAGNOSIS — I110 Hypertensive heart disease with heart failure: Secondary | ICD-10-CM | POA: Insufficient documentation

## 2020-09-09 DIAGNOSIS — I255 Ischemic cardiomyopathy: Secondary | ICD-10-CM

## 2020-09-09 DIAGNOSIS — Z79899 Other long term (current) drug therapy: Secondary | ICD-10-CM | POA: Insufficient documentation

## 2020-09-09 DIAGNOSIS — Z4502 Encounter for adjustment and management of automatic implantable cardiac defibrillator: Secondary | ICD-10-CM | POA: Insufficient documentation

## 2020-09-09 DIAGNOSIS — Z7902 Long term (current) use of antithrombotics/antiplatelets: Secondary | ICD-10-CM | POA: Insufficient documentation

## 2020-09-09 DIAGNOSIS — I2510 Atherosclerotic heart disease of native coronary artery without angina pectoris: Secondary | ICD-10-CM | POA: Diagnosis not present

## 2020-09-09 DIAGNOSIS — Z885 Allergy status to narcotic agent status: Secondary | ICD-10-CM | POA: Insufficient documentation

## 2020-09-09 HISTORY — PX: ICD GENERATOR CHANGEOUT: EP1231

## 2020-09-09 SURGERY — ICD GENERATOR CHANGEOUT

## 2020-09-09 MED ORDER — CEFAZOLIN SODIUM-DEXTROSE 2-4 GM/100ML-% IV SOLN
INTRAVENOUS | Status: AC
  Filled 2020-09-09: qty 100

## 2020-09-09 MED ORDER — LIDOCAINE HCL (PF) 1 % IJ SOLN
INTRAMUSCULAR | Status: AC
  Filled 2020-09-09: qty 60

## 2020-09-09 MED ORDER — ONDANSETRON HCL 4 MG/2ML IJ SOLN: 4.0000 mg | Freq: Four times a day (QID) | INTRAMUSCULAR | Status: DC | PRN

## 2020-09-09 MED ORDER — SODIUM CHLORIDE 0.9 % IV SOLN: INTRAVENOUS | Status: DC

## 2020-09-09 MED ORDER — LIDOCAINE HCL (PF) 1 % IJ SOLN
INTRAMUSCULAR | Status: DC | PRN
  Administered 2020-09-09: 60 mL

## 2020-09-09 MED ORDER — CHLORHEXIDINE GLUCONATE 4 % EX LIQD: 4.0000 "application " | Freq: Once | CUTANEOUS | Status: DC

## 2020-09-09 MED ORDER — SODIUM CHLORIDE 0.9 % IV SOLN
INTRAVENOUS | Status: AC
  Filled 2020-09-09: qty 2

## 2020-09-09 MED ORDER — CEFAZOLIN SODIUM-DEXTROSE 2-3 GM-%(50ML) IV SOLR: INTRAVENOUS | Status: DC | PRN

## 2020-09-09 MED ORDER — ACETAMINOPHEN 325 MG PO TABS
325.0000 mg | ORAL_TABLET | ORAL | Status: DC | PRN
  Filled 2020-09-09: qty 2

## 2020-09-09 MED ORDER — SODIUM CHLORIDE 0.9 % IV SOLN
80.0000 mg | INTRAVENOUS | Status: AC
  Administered 2020-09-09: 80 mg

## 2020-09-09 MED ORDER — CEFAZOLIN SODIUM-DEXTROSE 2-4 GM/100ML-% IV SOLN
2.0000 g | INTRAVENOUS | Status: AC
  Administered 2020-09-09: 2 g via INTRAVENOUS

## 2020-09-09 SURGICAL SUPPLY — 6 items
CABLE SURGICAL S-101-97-12 (CABLE) ×2 IMPLANT
ICD VISIA MRI DVFB1D1 (ICD Generator) ×1 IMPLANT
PAD PRO RADIOLUCENT 2001M-C (PAD) ×2 IMPLANT
POUCH AIGIS-R ANTIBACT ICD (Mesh General) ×2 IMPLANT
POUCH AIGIS-R ANTIBACT ICD LRG (Mesh General) IMPLANT
TRAY PACEMAKER INSERTION (PACKS) ×2 IMPLANT

## 2020-09-09 NOTE — Discharge Instructions (Signed)

## 2020-09-09 NOTE — Interval H&P Note (Signed)
History and Physical Interval Note:  09/09/2020 10:33 AM  [name]  has presented today for surgery, with the diagnosis of cadiomyopathy.  The various methods of treatment have been discussed with the patient and family. After consideration of risks, benefits and other options for treatment, the patient has consented to  Procedure(s): ICD [place] (N/A) as a surgical intervention.  The patient's history has been reviewed, patient examined, no change in status, stable for surgery.  I have reviewed the patient's chart and labs.  Questions were answered to the patient's satisfaction.     Dyan Creelman Meredith Leeds  ICD Criteria  Current LVEF:30-35%. Within 12 months prior to implant: No   Heart failure history: Yes, Class II  Cardiomyopathy history: Yes, Ischemic Cardiomyopathy - Prior MI.  Atrial Fibrillation/Atrial Flutter: No.  Ventricular tachycardia history: No.  Cardiac arrest history: No.  History of syndromes with risk of sudden death: No.  Previous ICD: Yes, Reason for ICD:  Primary prevention.  Current ICD indication: Primary  PPM indication: No.  Class I or II Bradycardia indication present: No  Beta Blocker therapy for 3 or more months: Yes, prescribed.   Ace Inhibitor/ARB therapy for 3 or more months: Yes, prescribed.    I have seen DEUNDRA BARD is a 65 y.o. malepre-procedural and has been referred by Agustin Cree for consideration of ICD implant for primary prevention of sudden death.  The patient's chart has been reviewed and they meet criteria for ICD implant.  I have had a thorough discussion with the patient reviewing options.  The patient and their family (if available) have had opportunities to ask questions and have them answered. The patient and I have decided together through the [place] [employer] to gen change ICD at this time.  Risks, benefits, alternatives to ICD implantation were discussed in detail with the  patient today. The patient  understands that the risks include but are not limited to bleeding, infection, pneumothorax, perforation, tamponade, vascular damage, renal failure, MI, stroke, death, inappropriate shocks, and lead dislodgement and wishes to proceed.

## 2020-09-19 ENCOUNTER — Ambulatory Visit (INDEPENDENT_AMBULATORY_CARE_PROVIDER_SITE_OTHER): Payer: Commercial Managed Care - PPO

## 2020-09-19 ENCOUNTER — Other Ambulatory Visit: Payer: Self-pay

## 2020-09-19 DIAGNOSIS — I255 Ischemic cardiomyopathy: Secondary | ICD-10-CM | POA: Diagnosis not present

## 2020-09-19 LAB — CUP PACEART INCLINIC DEVICE CHECK
Battery Remaining Longevity: 136 mo
Battery Voltage: 3.13 V
Brady Statistic RV Percent Paced: 0.01 %
Date Time Interrogation Session: 20220728114753
HighPow Impedance: 63 Ohm
Implantable Lead Implant Date: 20130307
Implantable Lead Location: 753860
Implantable Lead Model: 6935
Implantable Pulse Generator Implant Date: 20220718
Lead Channel Impedance Value: 285 Ohm
Lead Channel Impedance Value: 361 Ohm
Lead Channel Pacing Threshold Amplitude: 0.75 V
Lead Channel Pacing Threshold Pulse Width: 0.4 ms
Lead Channel Sensing Intrinsic Amplitude: 10.5 mV
Lead Channel Sensing Intrinsic Amplitude: 12 mV
Lead Channel Setting Pacing Amplitude: 2.25 V
Lead Channel Setting Pacing Pulse Width: 0.4 ms
Lead Channel Setting Sensing Sensitivity: 0.3 mV

## 2020-09-19 NOTE — Progress Notes (Signed)
Wound check appointment. Steri-strips removed. Wound without redness or edema. Incision edges approximated, wound well healed. Normal device function. Thresholds, sensing, and impedances consistent with implant measurements. Device programmed at appropriate safety margin for chronic RV lead. Histogram distribution appropriate for patient and level of activity. ! AT/Af episode recorded, <4 hours with Peak V rate of 146.  No ventricular arrhythmias noted. Patient educated about wound care, arm mobility, lifting restrictions, shock plan. Patient is enrolled in remote monitoring, next scheduled check 12/10/20.  ROV with Dr. Curt Bears 12/16/20.

## 2020-09-19 NOTE — Patient Instructions (Addendum)
   After Your ICD (Implantable Cardiac Defibrillator)    Monitor your defibrillator site for redness, swelling, and drainage. Call the device clinic at [phone] if you experience these symptoms or fever/chills.  Your incision was closed with Steri-strips or staples:  You may shower 7 days after your procedure and wash your incision with soap and water. Avoid lotions, ointments, or perfumes over your incision until it is well-healed.    You may use a hot tub or a pool after your wound check appointment if the incision is completely closed.  Do not lift, push or pull greater than 10 pounds with the affected arm until 6 weeks after your procedure. There are no other restrictions in arm movement after your wound check appointment.  Your ICD is MRI compatible.  Your ICD is designed to protect you from life threatening heart rhythms. Because of this, you may receive a shock.   1 shock with no symptoms:  Call the office during business hours. 1 shock with symptoms (chest pain, chest pressure, dizziness, lightheadedness, shortness of breath, overall feeling unwell):  Call 911. If you experience 2 or more shocks in 24 hours:  Call 911. If you receive a shock, you should not drive.  [place] DMV - no driving for 6 months if you receive appropriate therapy from your ICD.   ICD Alerts:  Some alerts are vibratory and others beep. These are NOT emergencies. Please call our office to let us know. If this occurs at night or on weekends, it can wait until the next business day. Send a remote transmission.  If your device is capable of reading fluid status (for heart failure), you will be offered monthly monitoring to review this with you.   Remote monitoring is used to monitor your ICD from home. This monitoring is scheduled every 91 days by our office. It allows us to keep an eye on the functioning of your device to ensure it is working properly. You will routinely see your Electrophysiologist annually  (more often if necessary).   

## 2020-09-27 NOTE — Progress Notes (Signed)
Remote ICD transmission.   

## 2020-10-07 ENCOUNTER — Encounter: Payer: Commercial Managed Care - PPO | Admitting: Cardiology

## 2020-11-12 ENCOUNTER — Encounter: Payer: Self-pay | Admitting: Cardiology

## 2020-11-12 ENCOUNTER — Ambulatory Visit (INDEPENDENT_AMBULATORY_CARE_PROVIDER_SITE_OTHER): Payer: Commercial Managed Care - PPO | Admitting: Cardiology

## 2020-11-12 ENCOUNTER — Other Ambulatory Visit: Payer: Self-pay

## 2020-11-12 VITALS — BP 96/66 | HR 69 | Ht 66.0 in | Wt 128.8 lb

## 2020-11-12 DIAGNOSIS — I2510 Atherosclerotic heart disease of native coronary artery without angina pectoris: Secondary | ICD-10-CM

## 2020-11-12 DIAGNOSIS — E785 Hyperlipidemia, unspecified: Secondary | ICD-10-CM

## 2020-11-12 DIAGNOSIS — Z95810 Presence of automatic (implantable) cardiac defibrillator: Secondary | ICD-10-CM

## 2020-11-12 DIAGNOSIS — I5022 Chronic systolic (congestive) heart failure: Secondary | ICD-10-CM

## 2020-11-12 NOTE — Addendum Note (Signed)
Addended by: Resa Miner I on: 11/12/2020 03:33 PM   Modules accepted: Orders

## 2020-11-12 NOTE — Patient Instructions (Signed)
Medication Instructions:  Your physician recommends that you continue on your current medications as directed. Please refer to the Current Medication list given to you today.  *If you need a refill on your cardiac medications before your next appointment, please call your pharmacy*   Lab Work: Your physician recommends that you return for lab work in: [place] If you have labs (blood work) drawn today and your tests are completely normal, you will receive your results only by: [place] (if you have MyChart) OR A paper copy in the mail If you have any lab test that is abnormal or we need to change your treatment, we will call you to review the results.   Testing/Procedures: Your physician has requested that you have an echocardiogram. Echocardiography is a painless test that uses sound waves to create images of your heart. It provides your doctor with information about the size and shape of your heart and how well your heart's chambers and valves are working. This procedure takes approximately one hour. There are no restrictions for this procedure.    Follow-Up: At [facility], you and your health needs are our priority.  As part of our continuing mission to provide you with exceptional heart care, we have created designated Provider Care Teams.  These Care Teams include your primary Cardiologist (physician) and Advanced Practice Providers (APPs -  Physician Assistants and Nurse Practitioners) who all work together to provide you with the care you need, when you need it.  We recommend signing up for the patient portal called "MyChart".  Sign up information is provided on this After Visit Summary.  MyChart is used to connect with patients for Virtual Visits (Telemedicine).  Patients are able to view lab/test results, encounter notes, upcoming appointments, etc.  Non-urgent messages can be sent to your provider as well.   To learn more about what you can do with MyChart, go to  [email or web address].    Your next appointment:   6 month(s)  The format for your next appointment:   In Person  Provider:   Jenne Campus, MD   Other Instructions

## 2020-11-12 NOTE — Progress Notes (Signed)
Cardiology Office Note:    Date:  11/12/2020   ID:  [name], DOB 03-27-1955, MRN [id number]  PCP:  Street, Sharon Mt, MD  Cardiologist:  Jenne Campus, MD    Referring MD: Street, Sharon Mt, *   Chief Complaint  Patient presents with   Follow-up    History of Present Illness:    [name] is a 65 y.o. male   past medical history significant for coronary artery disease in 2005 he suffered from acute anterior wall myocardial infarction he ended up having V. fib arrest that required defibrillation.  Stent to proximal LAD was placed at that time.  At that time he was found to have cardiomyopathy with diminished ejection fraction 30 to 35%, ICD was implanted which is a Medtronic device.  He is a chronic smoker.  He comes today to my office for follow-up He comes today to my office for follow-up.  He is upset because he was laid off from job.  Denies have any chest pain tightness squeezing pressure burning chest shortness of breath his usual may be slightly worse.  Sadly he still continues to smoke.  Recently had ICD replaced things went well and he is doing fine from that point review.  Past Medical History:  Diagnosis Date   Basal cell epithelioma    "under my left eye"   CAD (coronary artery disease)    s/p anterior wall MI 2005 with VF arrest at tha ttime s/p stent of prox LAD, with repeat intervention at that time for ruptured plque in RCA which had cleared so significantly that PCI was no longer contemplated    CAD, NATIVE VESSEL 03/20/2008   Qualifier: Diagnosis of  By: Caryl Comes, MD, Remus Blake    Chronic systolic congestive heart failure ([place]) 04/14/2017   Coronary artery disease involving native coronary artery of native heart without angina pectoris 11/05/2014   s/p anterior wall MI 2005 with VF arrest at tha ttime s/p stent of prox LAD, with repeat intervention at that time for ruptured plque in RCA which had cleared so significantly that PCI was  no longer contemplated  Formatting of this note might be different from the original. Status post anterior wall myocardial infarction stent to LAD in 2005   Dyslipidemia 04/14/2017   H/O heart artery stent 04/14/2017   High cholesterol    Hypertension    Implantable cardioverter-defibrillator (ICD) in situ 03/20/2008   Qualifier: Diagnosis of  By: Caryl Comes, MD, Remus Blake    Ischemic cardiomyopathy    EF 30-35% s/p ICD placement 2005. 6949 lead malfunction/device ERI s/p new lead & generator change (Medtronic) 04/2011.   Late effect of cerebrovascular accident (CVA) 09/01/2018   Old MI (myocardial infarction) 04/14/2017    Past Surgical History:  Procedure Laterality Date   APPENDECTOMY  ~ [place] WITH STENT PLACEMENT  2005   ICD GENERATOR CHANGEOUT N/A 09/09/2020   Procedure: ICD GENERATOR CHANGEOUT;  Surgeon: Constance Haw, MD;  Location: [place] [facility];  Service: Cardiovascular;  Laterality: N/A;   ICD placement  2005   ICD replaced  04/30/11   ICD lead replaced; old ICD removed; new ICD placed   LEAD REVISION N/A 04/30/2011   Procedure: LEAD REVISION;  Surgeon: Evans Lance, MD;  Location: [facility] CATH LAB;  Service: Cardiovascular;  Laterality: N/A;   NASAL SEPTUM SURGERY  1988    Current Medications: Current Meds  Medication Sig   albuterol (VENTOLIN HFA) 108 (  90 Base) MCG/ACT inhaler Inhale 2 puffs into the lungs every 4 (four) hours as needed for wheezing or shortness of breath.   aspirin 81 MG tablet Take 81 mg by mouth daily.   atorvastatin (LIPITOR) 40 MG tablet TAKE 1 TABLET BY MOUTH EVERY MORNING (Patient taking differently: Take 40 mg by mouth daily.)   carvedilol (COREG) 25 MG tablet TAKE 1 TABLET BY MOUTH TWICE DAILY WITH A MEAL (Patient taking differently: Take 25 mg by mouth 2 (two) times daily with a meal.)   clopidogrel (PLAVIX) 75 MG tablet TAKE 1 TABLET BY MOUTH EVERY MORNING (Patient taking differently: Take 75 mg by mouth daily.)    Coenzyme Q10 (COQ10 PO) Take 1 capsule by mouth in the morning. Unknown strenght   famotidine (PEPCID) 40 MG tablet Take 40 mg by mouth daily.   hydrochlorothiazide (HYDRODIURIL) 25 MG tablet TAKE 1 TABLET(25 MG) BY MOUTH DAILY (Patient taking differently: Take 25 mg by mouth daily.)   Melatonin 3 MG TBDP Take 3 mg by mouth at bedtime as needed (sleep).   nicotine (NICODERM CQ - DOSED IN MG/24 HOURS) 21 mg/24hr patch Place 21 mg onto the skin daily.   nitroGLYCERIN (NITROSTAT) 0.4 MG SL tablet Place 1 tablet (0.4 mg total) under the tongue every 5 (five) minutes as needed. (Patient taking differently: Place 0.4 mg under the tongue every 5 (five) minutes as needed for chest pain.)   Omega-3 Fatty Acids (FISH OIL PO) Take 1 capsule by mouth daily. Unknown strenght   ondansetron (ZOFRAN-ODT) 4 MG disintegrating tablet Take 4 mg by mouth every 8 (eight) hours as needed for nausea/vomiting.   Red Yeast Rice Extract (RED YEAST RICE PO) Take 1 capsule by mouth daily. Unknown strenght   sacubitril-valsartan (ENTRESTO) 24-26 MG Take 1 tablet by mouth 2 (two) times daily.     Allergies:   Spironolactone, Sulfasalazine, Sulfonamide derivatives, and Codeine   Social History   Socioeconomic History   Marital status: Married    Spouse name: Not on file   Number of children: Not on file   Years of education: Not on file   Highest education level: Not on file  Occupational History   Not on file  Tobacco Use   Smoking status: Former    Packs/day: 0.25    Years: 30.00    Pack years: 7.50    Types: Cigarettes    Quit date: 09/25/2019    Years since quitting: 1.1   Smokeless tobacco: Never   Tobacco comments:    currently wearing a patch  Substance and Sexual Activity   Alcohol use: Yes    Comment: 04/30/11 "2-3 beer q other weekend"   Drug use: No   Sexual activity: Yes  Other Topics Concern   Not on file  Social History Narrative   Not on file   Social Determinants of Health   Financial  Resource Strain: Not on file  Food Insecurity: Not on file  Transportation Needs: Not on file  Physical Activity: Not on file  Stress: Not on file  Social Connections: Not on file     Family History: The patient's family history includes Heart attack in his father. ROS:   Please see the history of present illness.    All 14 point review of systems negative except as described per history of present illness  EKGs/Labs/Other Studies Reviewed:      Recent Labs: 08/12/2020: BUN 15; Creatinine, Ser 1.28; Hemoglobin 13.4; Platelets 180; Potassium 4.7; Sodium 129  Recent Lipid  Panel    Component Value Date/Time   CHOL 135 10/26/2019 0935   TRIG 82 10/26/2019 0935   HDL 32 (L) 10/26/2019 0935   CHOLHDL 4.2 10/26/2019 0935   LDLCALC 87 10/26/2019 0935    Physical Exam:    VS:  BP 96/66 (BP Location: Left Arm, Patient Position: Sitting)   Pulse 69   Ht 5\' 6"  (1.676 m)   Wt 128 lb 12.8 oz (58.4 kg)   SpO2 99%   BMI 20.79 kg/m     Wt Readings from Last 3 Encounters:  11/12/20 128 lb 12.8 oz (58.4 kg)  09/09/20 130 lb (59 kg)  08/12/20 130 lb (59 kg)     GEN:  Well nourished, well developed in no acute distress HEENT: Normal NECK: No JVD; No carotid bruits LYMPHATICS: No lymphadenopathy CARDIAC: RRR, no murmurs, no rubs, no gallops RESPIRATORY:  Clear to auscultation without rales, wheezing or rhonchi  ABDOMEN: Soft, non-tender, non-distended MUSCULOSKELETAL:  No edema; No deformity  SKIN: Warm and dry LOWER EXTREMITIES: no swelling NEUROLOGIC:  Alert and oriented x 3 PSYCHIATRIC:  Normal affect   ASSESSMENT:    1. Chronic systolic congestive heart failure ([place])   2. Coronary artery disease involving native coronary artery of native heart without angina pectoris   3. Dyslipidemia   4. ICD (implantable cardioverter-defibrillator) in place    PLAN:    In order of problems listed above:  Coronary artery disease stable denies have any chest pain tightness squeezing  pressure burning chest, and appropriate medications. Cardiomyopathy with ejection fraction 35%.  We will schedule him to have echocardiogram to assess left ventricle ejection fraction.  He had difficulty tolerating higher dosages of medication because of blood pressure being well. Dyslipidemia I did review his K PN which show me LDL of 87 HDL 32.  He will be scheduled to have fasting lipid profile today. Smoking was spent at least 5 minutes talking about this I strongly recommended him to quit.   Medication Adjustments/Labs and Tests Ordered: Current medicines are reviewed at length with the patient today.  Concerns regarding medicines are outlined above.  No orders of the defined types were placed in this encounter.  Medication changes: No orders of the defined types were placed in this encounter.   Signed, Park Liter, MD, [facility] 11/12/2020 3:22 PM    [facility]

## 2020-11-13 LAB — LIPID PANEL
Chol/HDL Ratio: 4.1 ratio (ref 0.0–5.0)
Cholesterol, Total: 112 mg/dL (ref 100–199)
HDL: 27 mg/dL — ABNORMAL LOW (ref >39)
LDL Chol Calc (NIH): 64 mg/dL (ref 0–99)
Triglycerides: 112 mg/dL (ref 0–149)
VLDL Cholesterol Cal: 21 mg/dL (ref 5–40)

## 2020-11-22 ENCOUNTER — Other Ambulatory Visit: Payer: Self-pay

## 2020-11-22 ENCOUNTER — Ambulatory Visit (INDEPENDENT_AMBULATORY_CARE_PROVIDER_SITE_OTHER): Payer: Commercial Managed Care - PPO

## 2020-11-22 DIAGNOSIS — I5022 Chronic systolic (congestive) heart failure: Secondary | ICD-10-CM | POA: Diagnosis not present

## 2020-11-22 MED ORDER — PERFLUTREN LIPID MICROSPHERE
1.0000 mL | INTRAVENOUS | Status: AC | PRN
  Administered 2020-11-22: 4 mL via INTRAVENOUS

## 2020-11-25 LAB — ECHOCARDIOGRAM COMPLETE
Area-P 1/2: 3.6 cm2
S' Lateral: 4.1 cm

## 2020-11-30 ENCOUNTER — Other Ambulatory Visit: Payer: Self-pay | Admitting: Cardiology

## 2020-12-10 ENCOUNTER — Ambulatory Visit (INDEPENDENT_AMBULATORY_CARE_PROVIDER_SITE_OTHER): Payer: Commercial Managed Care - PPO

## 2020-12-10 DIAGNOSIS — I5022 Chronic systolic (congestive) heart failure: Secondary | ICD-10-CM

## 2020-12-12 LAB — CUP PACEART REMOTE DEVICE CHECK
Battery Remaining Longevity: 135 mo
Battery Voltage: 3.13 V
Brady Statistic RV Percent Paced: 0.01 %
Date Time Interrogation Session: 20221018184703
HighPow Impedance: 65 Ohm
Implantable Lead Implant Date: 20130307
Implantable Lead Location: 753860
Implantable Lead Model: 6935
Implantable Pulse Generator Implant Date: 20220718
Lead Channel Impedance Value: 285 Ohm
Lead Channel Impedance Value: 399 Ohm
Lead Channel Pacing Threshold Amplitude: 0.75 V
Lead Channel Pacing Threshold Pulse Width: 0.4 ms
Lead Channel Sensing Intrinsic Amplitude: 13.5 mV
Lead Channel Sensing Intrinsic Amplitude: 13.5 mV
Lead Channel Setting Pacing Amplitude: 2 V
Lead Channel Setting Pacing Pulse Width: 0.4 ms
Lead Channel Setting Sensing Sensitivity: 0.3 mV

## 2020-12-16 ENCOUNTER — Encounter: Payer: Commercial Managed Care - PPO | Admitting: Cardiology

## 2020-12-18 NOTE — Progress Notes (Signed)
Remote ICD transmission.   

## 2021-01-06 ENCOUNTER — Encounter: Payer: Self-pay | Admitting: Cardiology

## 2021-01-06 ENCOUNTER — Ambulatory Visit (INDEPENDENT_AMBULATORY_CARE_PROVIDER_SITE_OTHER): Payer: No Typology Code available for payment source | Admitting: Cardiology

## 2021-01-06 ENCOUNTER — Other Ambulatory Visit: Payer: Self-pay

## 2021-01-06 VITALS — BP 118/68 | HR 76 | Ht 66.0 in | Wt 130.6 lb

## 2021-01-06 DIAGNOSIS — I255 Ischemic cardiomyopathy: Secondary | ICD-10-CM | POA: Diagnosis not present

## 2021-01-06 NOTE — Progress Notes (Signed)
Electrophysiology Office Note   Date:  01/06/2021   ID:  Milton, Sagona Nov 27, 1955, MRN [id number]  PCP:  Street, Sharon Mt, MD  Cardiologist:  Agustin Cree Primary Electrophysiologist:  Mikah Rottinghaus Meredith Leeds, MD    No chief complaint on file.    History of Present Illness: MEHUL RUDIN is a 65 y.o. male who is being seen today for the evaluation of ischemic cardiomyopathy at the request of Street, Sharon Mt, *. Presenting today for electrophysiology evaluation.    He has a history significant for coronary artery disease, hypertension, hyperlipidemia, ischemic cardiomyopathy.  He is status post Medtronic ICD.  He had a generator change July 2022.  Today, denies symptoms of palpitations, chest pain, shortness of breath, orthopnea, PND, lower extremity edema, claudication, dizziness, presyncope, syncope, bleeding, or neurologic sequela. The patient is tolerating medications without difficulties.  Since being seen he has done well.  He is happy with how he is healed up from his generator change.  He has no chest pain or shortness of breath and is able to do all of his daily activities.  His only issue is neck pain.  He has plans to see orthopedics.   Past Medical History:  Diagnosis Date   Basal cell epithelioma    "under my left eye"   CAD (coronary artery disease)    s/p anterior wall MI 2005 with VF arrest at tha ttime s/p stent of prox LAD, with repeat intervention at that time for ruptured plque in RCA which had cleared so significantly that PCI was no longer contemplated    CAD, NATIVE VESSEL 03/20/2008   Qualifier: Diagnosis of  By: Caryl Comes, MD, Remus Blake    Chronic systolic congestive heart failure ([place]) 04/14/2017   Coronary artery disease involving native coronary artery of native heart without angina pectoris 11/05/2014   s/p anterior wall MI 2005 with VF arrest at tha ttime s/p stent of prox LAD, with repeat intervention at that time for ruptured  plque in RCA which had cleared so significantly that PCI was no longer contemplated  Formatting of this note might be different from the original. Status post anterior wall myocardial infarction stent to LAD in 2005   Dyslipidemia 04/14/2017   H/O heart artery stent 04/14/2017   High cholesterol    Hypertension    Implantable cardioverter-defibrillator (ICD) in situ 03/20/2008   Qualifier: Diagnosis of  By: Caryl Comes, MD, Remus Blake    Ischemic cardiomyopathy    EF 30-35% s/p ICD placement 2005. 6949 lead malfunction/device ERI s/p new lead & generator change (Medtronic) 04/2011.   Late effect of cerebrovascular accident (CVA) 09/01/2018   Old MI (myocardial infarction) 04/14/2017   Past Surgical History:  Procedure Laterality Date   APPENDECTOMY  ~ [place] WITH STENT PLACEMENT  2005   ICD GENERATOR CHANGEOUT N/A 09/09/2020   Procedure: ICD GENERATOR CHANGEOUT;  Surgeon: Constance Haw, MD;  Location: [facility];  Service: Cardiovascular;  Laterality: N/A;   ICD placement  2005   ICD replaced  04/30/11   ICD lead replaced; old ICD removed; new ICD placed   LEAD REVISION N/A 04/30/2011   Procedure: LEAD REVISION;  Surgeon: Evans Lance, MD;  Location: [facility] CATH LAB;  Service: Cardiovascular;  Laterality: N/A;   NASAL SEPTUM SURGERY  1988     Current Outpatient Medications  Medication Sig Dispense Refill   albuterol (VENTOLIN HFA) 108 (90 Base) MCG/ACT inhaler Inhale 2 puffs into the  lungs every 4 (four) hours as needed for wheezing or shortness of breath.     aspirin 81 MG tablet Take 81 mg by mouth daily.     atorvastatin (LIPITOR) 40 MG tablet TAKE 1 TABLET BY MOUTH EVERY MORNING 90 tablet 1   carvedilol (COREG) 25 MG tablet TAKE 1 TABLET BY MOUTH TWICE DAILY WITH A MEAL (Patient taking differently: Take 25 mg by mouth 2 (two) times daily with a meal.) 180 tablet 2   clopidogrel (PLAVIX) 75 MG tablet TAKE 1 TABLET BY MOUTH EVERY MORNING (Patient taking  differently: Take 75 mg by mouth daily.) 90 tablet 2   Coenzyme Q10 (COQ10 PO) Take 1 capsule by mouth in the morning. Unknown strenght     famotidine (PEPCID) 40 MG tablet Take 40 mg by mouth daily.     hydrochlorothiazide (HYDRODIURIL) 25 MG tablet TAKE 1 TABLET(25 MG) BY MOUTH DAILY (Patient taking differently: Take 25 mg by mouth daily.) 90 tablet 2   Melatonin 3 MG TBDP Take 3 mg by mouth at bedtime as needed (sleep).     nicotine (NICODERM CQ - DOSED IN MG/24 HOURS) 21 mg/24hr patch Place 21 mg onto the skin daily.     nitroGLYCERIN (NITROSTAT) 0.4 MG SL tablet Place 1 tablet (0.4 mg total) under the tongue every 5 (five) minutes as needed. (Patient taking differently: Place 0.4 mg under the tongue every 5 (five) minutes as needed for chest pain.) 25 tablet 6   Omega-3 Fatty Acids (FISH OIL PO) Take 1 capsule by mouth daily. Unknown strenght     ondansetron (ZOFRAN-ODT) 4 MG disintegrating tablet Take 4 mg by mouth every 8 (eight) hours as needed for nausea/vomiting.     Red Yeast Rice Extract (RED YEAST RICE PO) Take 1 capsule by mouth daily. Unknown strenght     sacubitril-valsartan (ENTRESTO) 24-26 MG Take 1 tablet by mouth 2 (two) times daily. 180 tablet 2   No current facility-administered medications for this visit.    Allergies:   Spironolactone, Sulfasalazine, Sulfonamide derivatives, and Codeine   Social History:  The patient  reports that he quit smoking about 15 months ago. His smoking use included cigarettes. He has a 7.50 pack-year smoking history. He has never used smokeless tobacco. He reports current alcohol use. He reports that he does not use drugs.   Family History:  The patient's family history includes Heart attack in his father.   ROS:  Please see the history of present illness.   Otherwise, review of systems is positive for none.   All other systems are reviewed and negative.   PHYSICAL EXAM: VS:  BP 118/68   Pulse 76   Ht 5\' 6"  (1.676 m)   Wt 130 lb 9.6 oz  (59.2 kg)   SpO2 98%   BMI 21.08 kg/m  , BMI Body mass index is 21.08 kg/m. GEN: Well nourished, well developed, in no acute distress  HEENT: normal  Neck: no JVD, carotid bruits, or masses Cardiac: RRR; no murmurs, rubs, or gallops,no edema  Respiratory:  clear to auscultation bilaterally, normal work of breathing GI: soft, nontender, nondistended, + BS MS: no deformity or atrophy  Skin: warm and dry, device site well healed Neuro:  Strength and sensation are intact Psych: euthymic mood, full affect  EKG:  EKG is ordered today. Personal review of the ekg ordered shows sinus rhythm, anterolateral infarct  Personal review of the device interrogation today. Results in [place]: 08/12/2020: BUN 15; Creatinine,  Ser 1.28; Hemoglobin 13.4; Platelets 180; Potassium 4.7; Sodium 129    Lipid Panel     Component Value Date/Time   CHOL 112 11/12/2020 1542   TRIG 112 11/12/2020 1542   HDL 27 (L) 11/12/2020 1542   CHOLHDL 4.1 11/12/2020 1542   LDLCALC 64 11/12/2020 1542     Wt Readings from Last 3 Encounters:  01/06/21 130 lb 9.6 oz (59.2 kg)  11/12/20 128 lb 12.8 oz (58.4 kg)  09/09/20 130 lb (59 kg)      Other studies Reviewed: Additional studies/ records that were reviewed today include: TTE 11/25/20  Review of the above records today demonstrates:   1. Akinetic apical cap. Left ventricular ejection fraction, by  estimation, is 30 to 35%. The left ventricle has moderately decreased  function. Left ventricular endocardial border not optimally defined to  evaluate regional wall motion. Left ventricular  diastolic parameters are consistent with Grade II diastolic dysfunction  (pseudonormalization).   2. Right ventricular systolic function is mildly reduced. The right  ventricular size is normal. There is normal pulmonary artery systolic  pressure.   3. The mitral valve is normal in structure. No evidence of mitral valve  regurgitation. No evidence of mitral  stenosis.   4. The aortic valve is normal in structure. Aortic valve regurgitation is  not visualized. No aortic stenosis is present.   5. The inferior vena cava is normal in size with greater than 50%  respiratory variability, suggesting right atrial pressure of 3 mmHg.    ASSESSMENT AND PLAN:  1.  Ischemic cardiomyopathy: Currently on Entresto 24/26 mg twice daily, carvedilol 25 mg twice daily.  He is status post Medtronic ICD.  Status post generator change 09/09/2020.  Device functioning appropriately.  No changes at this time.  2.  Coronary artery disease: No current chest pain  3.  Hyperlipidemia: Continue atorvastatin with goal LDL less than 70  4.  Tobacco abuse: Complete cessation encouraged  Current medicines are reviewed at length with the patient today.   The patient does not have concerns regarding his medicines.  The following changes were made today: none  Labs/ tests ordered today include:  Orders Placed This Encounter  Procedures   EKG 12-Lead       Disposition:   FU with Rian Busche 9 months  Signed, Madissen Wyse Meredith Leeds, MD  01/06/2021 4:40 PM     [address] [place] [address] [phone] (office) [phone] (fax)

## 2021-01-06 NOTE — Patient Instructions (Signed)
Medication Instructions:  Your physician recommends that you continue on your current medications as directed. Please refer to the Current Medication list given to you today.  *If you need a refill on your cardiac medications before your next appointment, please call your pharmacy*   Lab Work: None ordered   Testing/Procedures: None ordered   Follow-Up: At [facility], you and your health needs are our priority.  As part of our continuing mission to provide you with exceptional heart care, we have created designated Provider Care Teams.  These Care Teams include your primary Cardiologist (physician) and Advanced Practice Providers (APPs -  Physician Assistants and Nurse Practitioners) who all work together to provide you with the care you need, when you need it.  Your next appointment:   July  2023  The format for your next appointment:   In Person  Provider:   Allegra Lai, MD    Thank you for choosing [facility]!!   Trinidad Curet, RN [phone]

## 2021-01-20 ENCOUNTER — Telehealth: Payer: Self-pay | Admitting: Cardiology

## 2021-01-20 NOTE — Telephone Encounter (Signed)
  1. Has your device fired? no  2. Is you device beeping? no  3. Are you experiencing draining or swelling at device site? no  4. Are you calling to see if we received your device transmission? no  5. Have you passed out? no  Mickel Baas from [facility] calling to see if the patient can have an MRI of the cervical spine. Phone: [phone]   Please route to [place]

## 2021-01-20 NOTE — Telephone Encounter (Signed)
LVM for [name] at [facility] that patient cannot have and MRI due to he has an abandoned [phone] lead. Direct number to device clinic for questions.

## 2021-03-09 ENCOUNTER — Other Ambulatory Visit: Payer: Self-pay | Admitting: Cardiology

## 2021-03-11 ENCOUNTER — Ambulatory Visit (INDEPENDENT_AMBULATORY_CARE_PROVIDER_SITE_OTHER): Payer: BC Managed Care – PPO

## 2021-03-11 DIAGNOSIS — I255 Ischemic cardiomyopathy: Secondary | ICD-10-CM | POA: Diagnosis not present

## 2021-03-11 DIAGNOSIS — I5022 Chronic systolic (congestive) heart failure: Secondary | ICD-10-CM

## 2021-03-13 LAB — CUP PACEART REMOTE DEVICE CHECK
Battery Remaining Longevity: 133 mo
Battery Voltage: 3.08 V
Brady Statistic RV Percent Paced: 0.01 %
Date Time Interrogation Session: 20230118192406
HighPow Impedance: 60 Ohm
Implantable Lead Implant Date: 20130307
Implantable Lead Location: 753860
Implantable Lead Model: 6935
Implantable Pulse Generator Implant Date: 20220718
Lead Channel Impedance Value: 247 Ohm
Lead Channel Impedance Value: 342 Ohm
Lead Channel Pacing Threshold Amplitude: 0.625 V
Lead Channel Pacing Threshold Pulse Width: 0.4 ms
Lead Channel Sensing Intrinsic Amplitude: 10.875 mV
Lead Channel Sensing Intrinsic Amplitude: 10.875 mV
Lead Channel Setting Pacing Amplitude: 2 V
Lead Channel Setting Pacing Pulse Width: 0.4 ms
Lead Channel Setting Sensing Sensitivity: 0.3 mV

## 2021-03-14 ENCOUNTER — Telehealth: Payer: Self-pay

## 2021-03-14 MED ORDER — POTASSIUM CHLORIDE CRYS ER 20 MEQ PO TBCR: 20.0000 meq | EXTENDED_RELEASE_TABLET | Freq: Every day | ORAL | 0 refills | Status: AC

## 2021-03-14 MED ORDER — FUROSEMIDE 20 MG PO TABS: 20.0000 mg | ORAL_TABLET | Freq: Every day | ORAL | 0 refills | Status: DC

## 2021-03-14 NOTE — Telephone Encounter (Signed)
The patient has been notified of the result and verbalized understanding.  All questions (if any) were answered. Antonieta Iba, RN 03/14/2021 3:40 PM

## 2021-03-14 NOTE — Telephone Encounter (Signed)
-----   Message from Will Meredith Leeds, MD sent at 03/13/2021  8:08 AM EST ----- Normal remote reviewed. Battery and lead parameters stable. Optivol elevated. Encourage low salt diet. Take lasix 20 mg daily for the next 4 days along with 20 meq Kdur.

## 2021-03-24 NOTE — Progress Notes (Signed)
Remote ICD transmission.   

## 2021-03-26 ENCOUNTER — Other Ambulatory Visit: Payer: Self-pay | Admitting: Cardiology

## 2021-05-07 ENCOUNTER — Other Ambulatory Visit: Payer: Self-pay | Admitting: Cardiology

## 2021-05-14 ENCOUNTER — Encounter: Payer: Self-pay | Admitting: Cardiology

## 2021-05-14 ENCOUNTER — Ambulatory Visit (INDEPENDENT_AMBULATORY_CARE_PROVIDER_SITE_OTHER): Payer: No Typology Code available for payment source | Admitting: Cardiology

## 2021-05-14 VITALS — BP 108/64 | HR 80 | Ht 65.5 in | Wt 136.2 lb

## 2021-05-14 DIAGNOSIS — I2510 Atherosclerotic heart disease of native coronary artery without angina pectoris: Secondary | ICD-10-CM | POA: Diagnosis not present

## 2021-05-14 DIAGNOSIS — Z95810 Presence of automatic (implantable) cardiac defibrillator: Secondary | ICD-10-CM | POA: Diagnosis not present

## 2021-05-14 DIAGNOSIS — I5022 Chronic systolic (congestive) heart failure: Secondary | ICD-10-CM

## 2021-05-14 DIAGNOSIS — E785 Hyperlipidemia, unspecified: Secondary | ICD-10-CM

## 2021-05-14 DIAGNOSIS — I252 Old myocardial infarction: Secondary | ICD-10-CM | POA: Diagnosis not present

## 2021-05-14 NOTE — Progress Notes (Signed)
?Cardiology Office Note:   ? ?Date:  05/14/2021  ? ?ID:  [name], DOB May 03, 1955, MRN [id number] ? ?PCP:  Street, Sharon Mt, MD  ?Cardiologist:  Jenne Campus, MD   ? ?Referring MD: Street, Sharon Mt, *  ? ?Chief Complaint  ?Patient presents with  ? Leg Swelling  ?  Ongoing for a week   ? ? ?History of Present Illness:   ? ?[name] is a 66 y.o. male  past medical history significant for coronary artery disease in 2005 he suffered from acute anterior wall myocardial infarction he ended up having V. fib arrest that required defibrillation.  Stent to proximal LAD was placed at that time.  At that time he was found to have cardiomyopathy with diminished ejection fraction 30 to 35%, ICD was implanted which is a Medtronic device.  He is a chronic smoker.  He comes today to my office for follow-up ?He requested to be seen because he did notice swelling of lower extremities he also feels tired and exhausted.  He started new job in taking 1 hour each way to get there when he works he works 12-hour shift walking around standing [place] all the time, on top of that he has been working for 7 days straight in order for him to be able to come to my office visit today he had to take vacation day.  Complain of being tired exhausted does have swelling of lower extremities, no chest pain tightness squeezing pressure burning chest no discharges from the defibrillator. ? ?Past Medical History:  ?Diagnosis Date  ? Basal cell epithelioma   ? "under my left eye"  ? CAD (coronary artery disease)   ? s/p anterior wall MI 2005 with VF arrest at tha ttime s/p stent of prox LAD, with repeat intervention at that time for ruptured plque in RCA which had cleared so significantly that PCI was no longer contemplated   ? CAD, NATIVE VESSEL 03/20/2008  ? Qualifier: Diagnosis of  By: Caryl Comes, MD, Remus Blake   ? Chronic systolic congestive heart failure ([place]) 04/14/2017  ? Coronary artery disease involving native  coronary artery of native heart without angina pectoris 11/05/2014  ? s/p anterior wall MI 2005 with VF arrest at tha ttime s/p stent of prox LAD, with repeat intervention at that time for ruptured plque in RCA which had cleared so significantly that PCI was no longer contemplated  Formatting of this note might be different from the original. Status post anterior wall myocardial infarction stent to LAD in 2005  ? Dyslipidemia 04/14/2017  ? H/O heart artery stent 04/14/2017  ? High cholesterol   ? Hypertension   ? Implantable cardioverter-defibrillator (ICD) in situ 03/20/2008  ? Qualifier: Diagnosis of  By: Caryl Comes, MD, Remus Blake   ? Ischemic cardiomyopathy   ? EF 30-35% s/p ICD placement 2005. 6949 lead malfunction/device ERI s/p new lead & generator change (Medtronic) 04/2011.  ? Late effect of cerebrovascular accident (CVA) 09/01/2018  ? Old MI (myocardial infarction) 04/14/2017  ? ? ?Past Surgical History:  ?Procedure Laterality Date  ? APPENDECTOMY  ~ 1974  ? CORONARY ANGIOPLASTY WITH STENT PLACEMENT  2005  ? ICD GENERATOR CHANGEOUT N/A 09/09/2020  ? Procedure: ICD GENERATOR CHANGEOUT;  Surgeon: Constance Haw, MD;  Location: [facility] LAB;  Service: Cardiovascular;  Laterality: N/A;  ? ICD placement  2005  ? ICD replaced  04/30/11  ? ICD lead replaced; old ICD removed; new ICD placed  ?  LEAD REVISION N/A 04/30/2011  ? Procedure: LEAD REVISION;  Surgeon: Evans Lance, MD;  Location[facility] C[facility] LAB;  Service: Cardiovascular;  Laterality: N/A;  ? NASAL SEPTUM SURGERY  1988  ? ? ?Current Medications: ?Current Meds  ?Medication Sig  ? albuterol (VENTOLIN HFA) 108 (90 Base) MCG/ACT inhaler Inhale 2 puffs into the lungs every 4 (four) hours as needed for wheezing or shortness of breath.  ? aspirin 81 MG tablet Take 81 mg by mouth daily.  ? atorvastatin (LIPITOR) 40 MG tablet TAKE 1 TABLET BY MOUTH EVERY MORNING (Patient taking differently: Take 40 mg by mouth daily.)  ? carvedilol (COREG) 25 MG tablet Take 1  tablet (25 mg total) by mouth 2 (two) times daily with a meal.  ? clopidogrel (PLAVIX) 75 MG tablet Take 1 tablet (75 mg total) by mouth daily.  ? Coenzyme Q10 (COQ10 PO) Take 1 capsule by mouth in the morning. Unknown strenght  ? famotidine (PEPCID) 40 MG tablet Take 40 mg by mouth daily.  ? furosemide (LASIX) 20 MG tablet Take 1 tablet (20 mg total) by mouth daily. Take 1 tablet daily for 4 days  ? hydrochlorothiazide (HYDRODIURIL) 25 MG tablet TAKE 1 TABLET(25 MG) BY MOUTH DAILY (Patient taking differently: Take 25 mg by mouth daily.)  ? Melatonin 3 MG TBDP Take 3 mg by mouth at bedtime as needed (sleep).  ? nicotine (NICODERM CQ - DOSED IN MG/24 HOURS) 21 mg/24hr patch Place 21 mg onto the skin daily.  ? nitroGLYCERIN (NITROSTAT) 0.4 MG SL tablet Place 1 tablet (0.4 mg total) under the tongue every 5 (five) minutes as needed. (Patient taking differently: Place 0.4 mg under the tongue every 5 (five) minutes as needed for chest pain.)  ? Omega-3 Fatty Acids (FISH OIL PO) Take 1 capsule by mouth daily. Unknown strenght  ? ondansetron (ZOFRAN-ODT) 4 MG disintegrating tablet Take 4 mg by mouth every 8 (eight) hours as needed for nausea/vomiting.  ? potassium chloride SA (KLOR-CON M) 20 MEQ tablet Take 1 tablet (20 mEq total) by mouth daily.  ? Red Yeast Rice Extract (RED YEAST RICE PO) Take 1 capsule by mouth daily. Unknown strenght  ? sacubitril-valsartan (ENTRESTO) 24-26 MG Take 1 tablet by mouth 2 (two) times daily.  ?  ? ?Allergies:   Spironolactone, Sulfasalazine, Sulfonamide derivatives, and Codeine  ? ?Social History  ? ?Socioeconomic History  ? Marital status: Married  ?  Spouse name: Not on file  ? Number of children: Not on file  ? Years of education: Not on file  ? Highest education level: Not on file  ?Occupational History  ? Not on file  ?Tobacco Use  ? Smoking status: Former  ?  Packs/day: 0.25  ?  Years: 30.00  ?  Pack years: 7.50  ?  Types: Cigarettes  ?  Quit date: 09/25/2019  ?  Years since quitting:  1.6  ? Smokeless tobacco: Never  ? Tobacco comments:  ?  currently wearing a patch  ?Substance and Sexual Activity  ? Alcohol use: Yes  ?  Comment: 04/30/11 "2-3 beer q other weekend"  ? Drug use: No  ? Sexual activity: Yes  ?Other Topics Concern  ? Not on file  ?Social History Narrative  ? Not on file  ? ?Social Determinants of Health  ? ?Financial Resource Strain: Not on file  ?Food Insecurity: Not on file  ?Transportation Needs: Not on file  ?Physical Activity: Not on file  ?Stress: Not on file  ?Social Connections: Not  on file  ?  ? ?Family History: ?The patient's family history includes Heart attack in his father. ?ROS:   ?Please see the history of present illness.    ?All 14 point review of systems negative except as described per history of present illness ? ?EKGs/Labs/Other Studies Reviewed:   ? ? ? ?Recent Labs: ?08/12/2020: BUN 15; Creatinine, Ser 1.28; Hemoglobin 13.4; Platelets 180; Potassium 4.7; Sodium 129  ?Recent Lipid Panel ?   ?Component Value Date/Time  ? CHOL 112 11/12/2020 1542  ? TRIG 112 11/12/2020 1542  ? HDL 27 (L) 11/12/2020 1542  ? CHOLHDL 4.1 11/12/2020 1542  ? Guadalupe Guerra 64 11/12/2020 1542  ? ? ?Physical Exam:   ? ?VS:  BP 108/64 (BP Location: Left Arm, Patient Position: Sitting)   Pulse 80   Ht 5' 5.5" (1.664 m)   Wt 136 lb 3.2 oz (61.8 kg)   SpO2 98%   BMI 22.32 kg/m?    ? ?Wt Readings from Last 3 Encounters:  ?05/14/21 136 lb 3.2 oz (61.8 kg)  ?01/06/21 130 lb 9.6 oz (59.2 kg)  ?11/12/20 128 lb 12.8 oz (58.4 kg)  ?  ? ?GEN:  Well nourished, well developed in no acute distress ?HEENT: Normal ?NECK: No JVD; No carotid bruits ?LYMPHATICS: No lymphadenopathy ?CARDIAC: RRR, no murmurs, no rubs, no gallops ?RESPIRATORY:  Clear to auscultation without rales, wheezing or rhonchi  ?ABDOMEN: Soft, non-tender, non-distended ?MUSCULOSKELETAL:  No edema; No deformity  ?SKIN: Warm and dry ?LOWER EXTREMITIES: 1+ swelling ?NEUROLOGIC:  Alert and oriented x 3 ?PSYCHIATRIC:  Normal affect   ? ?ASSESSMENT:   ? ?1. Atherosclerosis of native coronary artery of native heart without angina pectoris   ?2. Chronic systolic congestive heart failure ([place])   ?3. Old MI (myocardial infarction)   ?4. ICD (implantable cardiove

## 2021-05-14 NOTE — Patient Instructions (Signed)
Medication Instructions:  ?Your physician recommends that you continue on your current medications as directed. Please refer to the Current Medication list given to you today. ? ?*If you need a refill on your cardiac medications before your next appointment, please call your pharmacy* ? ? ?Lab Work: ?Your physician recommends that you return for lab work in: Today for BMP and ProBnp ? ?If you have labs (blood work) drawn today and your tests are completely normal, you will receive your results only by: ?MyChart Message (if you have MyChart) OR ?A paper copy in the mail ?If you have any lab test that is abnormal or we need to change your treatment, we will call you to review the results. ? ? ?Testing/Procedures: ?Your physician has requested that you have an echocardiogram. Echocardiography is a painless test that uses sound waves to create images of your heart. It provides your doctor with information about the size and shape of your heart and how well your heart?s chambers and valves are working. This procedure takes approximately one hour. There are no restrictions for this procedure.  ? ? ?Follow-Up: ?At [facility], you and your health needs are our priority.  As part of our continuing mission to provide you with exceptional heart care, we have created designated Provider Care Teams.  These Care Teams include your primary Cardiologist (physician) and Advanced Practice Providers (APPs -  Physician Assistants and Nurse Practitioners) who all work together to provide you with the care you need, when you need it. ? ?We recommend signing up for the patient portal called "MyChart".  Sign up information is provided on this After Visit Summary.  MyChart is used to connect with patients for Virtual Visits (Telemedicine).  Patients are able to view lab/test results, encounter notes, upcoming appointments, etc.  Non-urgent messages can be sent to your provider as well.   ?To learn more about what you can do with  MyChart, go to [email or web address].   ? ?Your next appointment:   ?3 month(s) ? ?The format for your next appointment:   ?In Person ? ?Provider:   ?Jenne Campus, MD  ? ? ?Other Instructions ?  ?

## 2021-05-15 LAB — BASIC METABOLIC PANEL
BUN/Creatinine Ratio: 13 (ref 10–24)
BUN: 14 mg/dL (ref 8–27)
CO2: 24 mmol/L (ref 20–29)
Calcium: 9.1 mg/dL (ref 8.6–10.2)
Chloride: 100 mmol/L (ref 96–106)
Creatinine, Ser: 1.09 mg/dL (ref 0.76–1.27)
Glucose: 96 mg/dL (ref 70–99)
Potassium: 5.8 mmol/L — ABNORMAL HIGH (ref 3.5–5.2)
Sodium: 136 mmol/L (ref 134–144)
eGFR: 76 mL/min/{1.73_m2} (ref >59)

## 2021-05-15 LAB — PRO B NATRIURETIC PEPTIDE: NT-Pro BNP: 252 pg/mL — ABNORMAL HIGH (ref 0–210)

## 2021-05-22 ENCOUNTER — Ambulatory Visit (INDEPENDENT_AMBULATORY_CARE_PROVIDER_SITE_OTHER): Payer: BC Managed Care – PPO

## 2021-05-22 DIAGNOSIS — I2510 Atherosclerotic heart disease of native coronary artery without angina pectoris: Secondary | ICD-10-CM | POA: Diagnosis not present

## 2021-05-22 DIAGNOSIS — Z95810 Presence of automatic (implantable) cardiac defibrillator: Secondary | ICD-10-CM

## 2021-05-22 DIAGNOSIS — I252 Old myocardial infarction: Secondary | ICD-10-CM | POA: Diagnosis not present

## 2021-05-22 DIAGNOSIS — E785 Hyperlipidemia, unspecified: Secondary | ICD-10-CM

## 2021-05-22 DIAGNOSIS — I5022 Chronic systolic (congestive) heart failure: Secondary | ICD-10-CM

## 2021-05-22 MED ORDER — PERFLUTREN LIPID MICROSPHERE
1.0000 mL | INTRAVENOUS | Status: AC | PRN
  Administered 2021-05-22: 4 mL via INTRAVENOUS

## 2021-05-23 ENCOUNTER — Telehealth: Payer: Self-pay | Admitting: Cardiology

## 2021-05-23 ENCOUNTER — Encounter: Payer: Self-pay | Admitting: Cardiology

## 2021-05-23 LAB — ECHOCARDIOGRAM COMPLETE
Area-P 1/2: 5.13 cm2
Calc EF: 42.7 %
S' Lateral: 3.7 cm
Single Plane A2C EF: 47.8 %
Single Plane A4C EF: 36 %

## 2021-05-23 NOTE — Telephone Encounter (Signed)
Patient informed of results.  

## 2021-05-23 NOTE — Telephone Encounter (Signed)
° °  Pt is returning call to get lab result °

## 2021-06-09 ENCOUNTER — Other Ambulatory Visit: Payer: Self-pay | Admitting: Cardiology

## 2021-06-10 ENCOUNTER — Other Ambulatory Visit: Payer: Self-pay | Admitting: Cardiology

## 2021-06-10 ENCOUNTER — Ambulatory Visit (INDEPENDENT_AMBULATORY_CARE_PROVIDER_SITE_OTHER): Payer: No Typology Code available for payment source

## 2021-06-10 DIAGNOSIS — I255 Ischemic cardiomyopathy: Secondary | ICD-10-CM

## 2021-06-10 LAB — CUP PACEART REMOTE DEVICE CHECK
Battery Remaining Longevity: 132 mo
Battery Voltage: 3.05 V
Brady Statistic RV Percent Paced: 0.01 %
Date Time Interrogation Session: 20230418082615
HighPow Impedance: 57 Ohm
Implantable Lead Implant Date: 20130307
Implantable Lead Location: 753860
Implantable Lead Model: 6935
Implantable Pulse Generator Implant Date: 20220718
Lead Channel Impedance Value: 247 Ohm
Lead Channel Impedance Value: 342 Ohm
Lead Channel Pacing Threshold Amplitude: 0.625 V
Lead Channel Pacing Threshold Pulse Width: 0.4 ms
Lead Channel Sensing Intrinsic Amplitude: 9.375 mV
Lead Channel Sensing Intrinsic Amplitude: 9.375 mV
Lead Channel Setting Pacing Amplitude: 2 V
Lead Channel Setting Pacing Pulse Width: 0.4 ms
Lead Channel Setting Sensing Sensitivity: 0.3 mV

## 2021-06-27 NOTE — Progress Notes (Signed)
Remote ICD transmission.   

## 2021-07-16 ENCOUNTER — Telehealth: Payer: Self-pay

## 2021-07-16 NOTE — Telephone Encounter (Signed)
Referred to [facility] clinic by Dr Curt Bears after reviewing most recent remote transmission and Optivol elevated.   Attempted call to patient for ICM intro and left message for return call.    06/10/2021 Remote Transmission

## 2021-07-22 ENCOUNTER — Emergency Department (HOSPITAL_COMMUNITY): Payer: BC Managed Care – PPO

## 2021-07-22 ENCOUNTER — Other Ambulatory Visit: Payer: Self-pay

## 2021-07-22 ENCOUNTER — Emergency Department (HOSPITAL_COMMUNITY)
Admission: EM | Admit: 2021-07-22 | Discharge: 2021-07-23 | Disposition: A | Discharge status: Home / Self Care | Payer: BC Managed Care – PPO | Attending: Emergency Medicine | Admitting: Emergency Medicine

## 2021-07-22 DIAGNOSIS — J40 Bronchitis, not specified as acute or chronic: Secondary | ICD-10-CM | POA: Insufficient documentation

## 2021-07-22 DIAGNOSIS — Z7902 Long term (current) use of antithrombotics/antiplatelets: Secondary | ICD-10-CM | POA: Diagnosis not present

## 2021-07-22 DIAGNOSIS — I2510 Atherosclerotic heart disease of native coronary artery without angina pectoris: Secondary | ICD-10-CM | POA: Diagnosis not present

## 2021-07-22 DIAGNOSIS — Z79899 Other long term (current) drug therapy: Secondary | ICD-10-CM | POA: Diagnosis not present

## 2021-07-22 DIAGNOSIS — I5022 Chronic systolic (congestive) heart failure: Secondary | ICD-10-CM | POA: Insufficient documentation

## 2021-07-22 DIAGNOSIS — I110 Hypertensive heart disease with heart failure: Secondary | ICD-10-CM | POA: Diagnosis not present

## 2021-07-22 DIAGNOSIS — Z20822 Contact with and (suspected) exposure to covid-19: Secondary | ICD-10-CM | POA: Insufficient documentation

## 2021-07-22 DIAGNOSIS — J449 Chronic obstructive pulmonary disease, unspecified: Secondary | ICD-10-CM | POA: Diagnosis not present

## 2021-07-22 DIAGNOSIS — Z7982 Long term (current) use of aspirin: Secondary | ICD-10-CM | POA: Insufficient documentation

## 2021-07-22 DIAGNOSIS — R0602 Shortness of breath: Secondary | ICD-10-CM | POA: Diagnosis present

## 2021-07-22 LAB — CBC WITH DIFFERENTIAL/PLATELET
Abs Immature Granulocytes: 0.01 10*3/uL (ref 0.00–0.07)
Basophils Absolute: 0.1 10*3/uL (ref 0.0–0.1)
Basophils Relative: 1 %
Eosinophils Absolute: 0.2 10*3/uL (ref 0.0–0.5)
Eosinophils Relative: 4 %
HCT: 36 % — ABNORMAL LOW (ref 39.0–52.0)
Hemoglobin: 12.4 g/dL — ABNORMAL LOW (ref 13.0–17.0)
Immature Granulocytes: 0 %
Lymphocytes Relative: 52 %
Lymphs Abs: 2.6 10*3/uL (ref 0.7–4.0)
MCH: 30.8 pg (ref 26.0–34.0)
MCHC: 34.4 g/dL (ref 30.0–36.0)
MCV: 89.6 fL (ref 80.0–100.0)
Monocytes Absolute: 0.6 10*3/uL (ref 0.1–1.0)
Monocytes Relative: 11 %
Neutro Abs: 1.7 10*3/uL (ref 1.7–7.7)
Neutrophils Relative %: 32 %
Platelets: 153 10*3/uL (ref 150–400)
RBC: 4.02 MIL/uL — ABNORMAL LOW (ref 4.22–5.81)
RDW: 13.2 % (ref 11.5–15.5)
WBC: 5.1 10*3/uL (ref 4.0–10.5)
nRBC: 0 % (ref 0.0–0.2)

## 2021-07-22 LAB — COMPREHENSIVE METABOLIC PANEL
ALT: 24 U/L (ref 0–44)
AST: 34 U/L (ref 15–41)
Albumin: 3.8 g/dL (ref 3.5–5.0)
Alkaline Phosphatase: 46 U/L (ref 38–126)
Anion gap: 8 (ref 5–15)
BUN: 10 mg/dL (ref 8–23)
CO2: 23 mmol/L (ref 22–32)
Calcium: 8.9 mg/dL (ref 8.9–10.3)
Chloride: 101 mmol/L (ref 98–111)
Creatinine, Ser: 1.21 mg/dL (ref 0.61–1.24)
GFR, Estimated: >60 mL/min (ref >60)
Glucose, Bld: 94 mg/dL (ref 70–99)
Potassium: 4.5 mmol/L (ref 3.5–5.1)
Sodium: 132 mmol/L — ABNORMAL LOW (ref 135–145)
Total Bilirubin: 0.6 mg/dL (ref 0.3–1.2)
Total Protein: 7.1 g/dL (ref 6.5–8.1)

## 2021-07-22 LAB — TROPONIN I (HIGH SENSITIVITY): Troponin I (High Sensitivity): 5 ng/L (ref <18)

## 2021-07-22 LAB — SARS CORONAVIRUS 2 BY RT PCR: SARS Coronavirus 2 by RT PCR: NEGATIVE

## 2021-07-22 NOTE — ED Triage Notes (Signed)
Pt here from home for shob that started today. PT reports he has had a nonproductive cough but feels he has congestion in his chest w/ a "heaviness", states sob is worse w/ laying down and with exertion. Pt hx of COPD, used albuterol inhaler today, did not use nebulizer. Pt had negative home covid test today, denies fevers.

## 2021-07-22 NOTE — ED Provider Triage Note (Signed)
Emergency Medicine Provider Triage Evaluation Note  [name] , a 66 y.o. male  was evaluated in triage.  Pt complains of dyspnea today. Dyspnea associated with palpitations, chest heaviness, and dry cough. Worse in certain positions. Tried inhaler w/o relief.  Review of Systems  Positive: Dyspnea, cough, chest heaviness, palpitations Negative: Hemoptysis, leg swelling  Physical Exam  BP 108/82 (BP Location: Right Arm)   Pulse 76   Temp (!) 97.5 F (36.4 C) (Oral)   Resp 18   SpO2 100%  Gen:   Awake, no distress   Resp:  Normal effort  MSK:   Moves extremities without difficulty    Medical Decision Making  Medically screening exam initiated at 10:55 PM.  Appropriate orders placed.  SANFORD LINDBLAD was informed that the remainder of the evaluation will be completed by another provider, this initial triage assessment does not replace that evaluation, and the importance of remaining in the ED until their evaluation is complete.  Dyspnea   Amaryllis Dyke, PA-C 07/22/21 2306

## 2021-07-23 LAB — TROPONIN I (HIGH SENSITIVITY): Troponin I (High Sensitivity): 5 ng/L (ref <18)

## 2021-07-23 LAB — BRAIN NATRIURETIC PEPTIDE: B Natriuretic Peptide: 51.1 pg/mL (ref 0.0–100.0)

## 2021-07-23 MED ORDER — AZITHROMYCIN 250 MG PO TABS: ORAL_TABLET | ORAL | 0 refills | Status: DC

## 2021-07-23 MED ORDER — PREDNISONE 10 MG PO TABS: 20.0000 mg | ORAL_TABLET | Freq: Every day | ORAL | 0 refills | Status: AC

## 2021-07-23 NOTE — ED Provider Notes (Signed)
[facility] EMERGENCY DEPARTMENT Provider Note  CSN: [id number] Arrival date & time: 07/22/21 2231  Chief Complaint(s) Shortness of Breath  HPI [name] is a 66 y.o. male with a past medical history listed below including CAD status post MI resulting in ischemic cardiomyopathy and chronic systolic heart failure with a last EF of 35 to 40% in March of this year requiring ICD placement in 2010 who, COPD not on supplemental oxygen, hypertension, hyperlipidemia.  He presents tonight for 14 hours of chest tightness that is nonradiating and nonexertional.  Worse with lying down supine.  Improved with sitting up.  Patient reports that he feels gurgling in his throat and is unable to clear his mucus.  He denies any fevers or chills.  He endorses stable shortness of breath.  No peripheral edema.  No abdominal pain.  No nausea or vomiting.  No other physical complaints.  The history is provided by the patient.   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"   CAD (coronary artery disease)    s/p anterior wall MI 2005 with VF arrest at tha ttime s/p stent of prox LAD, with repeat intervention at that time for ruptured plque in RCA which had cleared so significantly that PCI was no longer contemplated    CAD, NATIVE VESSEL 03/20/2008   Qualifier: Diagnosis of  By: Caryl Comes, MD, Remus Blake    Chronic systolic congestive heart failure ([place]) 04/14/2017   Coronary artery disease involving native coronary artery of native heart without angina pectoris 11/05/2014   s/p anterior wall MI 2005 with VF arrest at tha ttime s/p stent of prox LAD, with repeat intervention at that time for ruptured plque in RCA which had cleared so significantly that PCI was no longer contemplated  Formatting of this note might be different from the original. Status post anterior wall myocardial infarction stent to LAD in 2005    Dyslipidemia 04/14/2017   H/O heart artery stent 04/14/2017   High cholesterol    Hypertension    Implantable cardioverter-defibrillator (ICD) in situ 03/20/2008   Qualifier: Diagnosis of  By: Caryl Comes, MD, Remus Blake    Ischemic cardiomyopathy    EF 30-35% s/p ICD placement 2005. 6949 lead malfunction/device ERI s/p new lead & generator change (Medtronic) 04/2011.   Late effect of cerebrovascular accident (CVA) 09/01/2018   Old MI (myocardial infarction) 04/14/2017   Patient Active Problem List   Diagnosis Date Noted   Hypertension    High cholesterol    Basal cell epithelioma    Late effect of cerebrovascular accident (CVA) 62/95/2841   Chronic systolic congestive heart failure ([place]) 04/14/2017   Dyslipidemia 04/14/2017   H/O heart artery stent 04/14/2017   Old MI (myocardial infarction) 04/14/2017   Coronary artery disease involving native coronary artery of native heart without angina pectoris 11/05/2014   Ischemic cardiomyopathy    CAD, NATIVE VESSEL 03/20/2008   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   Home Medication(s) Prior to Admission medications   Medication Sig Start Date End Date Taking? Authorizing Provider  azithromycin (ZITHROMAX Z-PAK) 250 MG tablet Take 500 mg on day 1, then 250 mg once a day for days 2, 3, 4, and 5. 07/23/21  Yes Ashlin Kreps, Grayce Sessions, MD  predniSONE (DELTASONE) 10 MG tablet Take 2 tablets (20 mg total) by mouth daily for 5 days. 07/23/21 07/28/21 Yes Zai Chmiel, Grayce Sessions, MD  albuterol (VENTOLIN  HFA) 108 (90 Base) MCG/ACT inhaler Inhale 2 puffs into the lungs every 4 (four) hours as needed for wheezing or shortness of breath.    [provider]  aspirin 81 MG tablet Take 81 mg by mouth daily.    [provider]  atorvastatin (LIPITOR) 40 MG tablet TAKE 1 TABLET BY MOUTH EVERY MORNING 06/09/21   Park Liter, MD  carvedilol (COREG) 25 MG tablet Take 1 tablet (25 mg total) by mouth 2 (two) times  daily with a meal. 03/26/21   Park Liter, MD  clopidogrel (PLAVIX) 75 MG tablet Take 1 tablet (75 mg total) by mouth daily. 03/10/21   Park Liter, MD  Coenzyme Q10 (COQ10 PO) Take 1 capsule by mouth in the morning. Unknown strenght    [provider]  famotidine (PEPCID) 40 MG tablet Take 40 mg by mouth daily. 08/02/20   [provider]  furosemide (LASIX) 20 MG tablet Take 1 tablet (20 mg total) by mouth daily. Take 1 tablet daily for 4 days 03/14/21   Constance Haw, MD  hydrochlorothiazide (HYDRODIURIL) 25 MG tablet Take 1 tablet (25 mg total) by mouth daily. 06/10/21   Park Liter, MD  Melatonin 3 MG TBDP Take 3 mg by mouth at bedtime as needed (sleep).    [provider]  nicotine (NICODERM CQ - DOSED IN MG/24 HOURS) 21 mg/24hr patch Place 21 mg onto the skin daily. 02/10/19   [provider]  nitroGLYCERIN (NITROSTAT) 0.4 MG SL tablet Place 1 tablet (0.4 mg total) under the tongue every 5 (five) minutes as needed. Patient taking differently: Place 0.4 mg under the tongue every 5 (five) minutes as needed for chest pain. 12/21/18   Park Liter, MD  Omega-3 Fatty Acids (FISH OIL PO) Take 1 capsule by mouth daily. Unknown strenght    [provider]  ondansetron (ZOFRAN-ODT) 4 MG disintegrating tablet Take 4 mg by mouth every 8 (eight) hours as needed for nausea/vomiting. 08/02/20   [provider]  potassium chloride SA (KLOR-CON M) 20 MEQ tablet Take 1 tablet (20 mEq total) by mouth daily. 03/14/21   Camnitz, Ocie Doyne, MD  Red Yeast Rice Extract (RED YEAST RICE PO) Take 1 capsule by mouth daily. Unknown strenght    [provider]  sacubitril-valsartan (ENTRESTO) 24-26 MG Take 1 tablet by mouth 2 (two) times daily. 05/07/21   Park Liter, MD                                                                                                                                     Allergies Spironolactone, Sulfasalazine, Sulfonamide derivatives, and Codeine  Review of Systems Review of Systems As noted in HPI  Physical Exam Vital Signs  I have reviewed the triage vital signs BP 109/69   Pulse 73   Temp (!) 97.5 F (36.4 C) (Oral)   Resp 12  SpO2 98%   Physical Exam Vitals reviewed.  Constitutional:      General: He is not in acute distress.    Appearance: He is well-developed. He is not diaphoretic.  HENT:     Head: Normocephalic and atraumatic.     Nose: Nose normal.  Eyes:     General: No scleral icterus.       Right eye: No discharge.        Left eye: No discharge.     Conjunctiva/sclera: Conjunctivae normal.     Pupils: Pupils are equal, round, and reactive to light.  Cardiovascular:     Rate and Rhythm: Normal rate and regular rhythm.     Heart sounds: No murmur heard.   No friction rub. No gallop.  Pulmonary:     Effort: Pulmonary effort is normal. No respiratory distress.     Breath sounds: No stridor. Examination of the right-middle field reveals rhonchi. Examination of the left-middle field reveals rhonchi. Examination of the right-lower field reveals rhonchi. Examination of the left-lower field reveals rhonchi. Rhonchi (that clear with chest percussion and coughing) present. No rales.  Abdominal:     General: There is no distension.     Palpations: Abdomen is soft.     Tenderness: There is no abdominal tenderness.  Musculoskeletal:        General: No tenderness.     Cervical back: Normal range of motion and neck supple.     Right lower leg: No edema.     Left lower leg: No edema.  Skin:    General: Skin is warm and dry.     Findings: No erythema or rash.  Neurological:     Mental Status: He is alert and oriented to person, place, and time.    ED Results and Treatments Labs (all labs ordered are listed, but only abnormal results are displayed) Labs Reviewed  CBC WITH DIFFERENTIAL/PLATELET - Abnormal; Notable for the  following components:      Result Value   RBC 4.02 (*)    Hemoglobin 12.4 (*)    HCT 36.0 (*)    All other components within normal limits  COMPREHENSIVE METABOLIC PANEL - Abnormal; Notable for the following components:   Sodium 132 (*)    All other components within normal limits  SARS CORONAVIRUS 2 BY RT PCR  BRAIN NATRIURETIC PEPTIDE  TROPONIN I (HIGH SENSITIVITY)  TROPONIN I (HIGH SENSITIVITY)                                                                                                                         EKG  EKG Interpretation  Date/Time:  Tuesday Jul 22 2021 22:47:09 EDT Ventricular Rate:  73 PR Interval:  144 QRS Duration: 74 QT Interval:  324 QTC Calculation: 356 R Axis:   13 Text Interpretation: Normal sinus rhythm Anteroseptal infarct , age undetermined Abnormal ECG When compared with ECG of 23-Feb-2019 13:43, PREVIOUS ECG IS PRESENT Confirmed by Addison Lank [phone]) on 07/23/2021  1:55:47 AM       Radiology DG Chest 2 View  Result Date: 07/22/2021 CLINICAL DATA:  dyspnea EXAM: CHEST - 2 VIEW COMPARISON:  Chest x-ray 02/23/2019 FINDINGS: Left chest wall 2 lead cardiac device in grossly similar position. The heart and mediastinal contours are unchanged. No focal consolidation. No pulmonary edema. No pleural effusion. No pneumothorax. No acute osseous abnormality. IMPRESSION: No active cardiopulmonary disease. Electronically Signed   By: Iven Finn M.D.   On: 07/22/2021 23:36    Pertinent labs & imaging results that were available during my care of the patient were reviewed by me and considered in my medical decision making (see MDM for details).  Medications Ordered in ED Medications - No data to display                                                                                                                                   Procedures Procedures  (including critical care time)  Medical Decision Making / ED Course    Complexity of  Problem:  Co-morbidities/SDOH that complicate the patient evaluation/care: Noted above in HPI  Additional history obtained: Recent echo noted above in HPI  Patient's presenting problem/concern, DDX, and MDM listed below: Chest pressure We will rule out ACS.  Given the duration of the patient's pain lasting more than 14 hours constantly, I feel that obtaining the EKG and delta troponins would be sufficient to rule out ACS. We will also assess for CHF exacerbation though I have low suspicion for this given his volume status. Given his report of increased sputum, will assess for pneumonia.  Also considering bronchitis/COPD. We will also rule out pneumothorax.  Hospitalization Considered:  Yes    Complexity of Data:   Cardiac Monitoring: The patient was maintained on a cardiac monitor.   I personally viewed and interpreted the cardiac monitored which showed an underlying rhythm of normal sinus rhythm with rates in the 70s to 80s EKG without acute ischemic changes or evidence of pericarditis  Laboratory Tests ordered listed below with my independent interpretation: CBC without leukocytosis or anemia No significant electrolyte derangements or renal sufficiency Serial troponins negative x2 BNP within normal limits   Imaging Studies ordered listed below with my independent interpretation: Chest x-ray without evidence of pneumonia, pneumothorax, pulmonary edema or pleural effusions.     ED Course:    Assessment, Add'l Intervention, and Reassessment: Chest discomfort Improved after chest percussion, flutter valve and clearing his mucus No evidence of pneumonia on exam Ruled out for heart failure and ACS. Doubt PE or dissection We will DC home with azithromycin and prednisone    Final Clinical Impression(s) / ED Diagnoses Final diagnoses:  Bronchitis   The patient appears reasonably screened and/or stabilized for discharge and I doubt any other medical condition or other [facility]  requiring further screening, evaluation, or treatment in the ED at this time prior to discharge. Safe for discharge with strict  return precautions.  Disposition: Discharge  Condition: Good  I have discussed the results, Dx and Tx plan with the patient/family who expressed understanding and agree(s) with the plan. Discharge instructions discussed at length. The patient/family was given strict return precautions who verbalized understanding of the instructions. No further questions at time of discharge.    ED Discharge Orders          Ordered    azithromycin (ZITHROMAX Z-PAK) 250 MG tablet        07/23/21 0435    predniSONE (DELTASONE) 10 MG tablet  Daily        07/23/21 0435            Follow Up: Street, Sharon Mt, MD [place] [address] [phone]  Call  to schedule an appointment for close follow up           This chart was dictated using voice recognition software.  Despite best efforts to proofread,  errors can occur which can change the documentation meaning.    Fatima Blank, MD 07/23/21 [phone]

## 2021-07-23 NOTE — Progress Notes (Signed)
Instructed patient with Flutter valve use, using the teach back method. Patient displayed good use and understanding. Had good productive cough.

## 2021-07-29 NOTE — Telephone Encounter (Signed)
Attempted ICM Referral call and no answer.   Unable to reach patient for ICM enrollment.

## 2021-08-19 ENCOUNTER — Ambulatory Visit (INDEPENDENT_AMBULATORY_CARE_PROVIDER_SITE_OTHER): Payer: BC Managed Care – PPO | Admitting: Cardiology

## 2021-08-19 ENCOUNTER — Encounter: Payer: Self-pay | Admitting: Cardiology

## 2021-08-19 VITALS — BP 100/70 | HR 78 | Ht 65.5 in | Wt 131.0 lb

## 2021-08-19 DIAGNOSIS — I5022 Chronic systolic (congestive) heart failure: Secondary | ICD-10-CM

## 2021-08-19 DIAGNOSIS — Z95810 Presence of automatic (implantable) cardiac defibrillator: Secondary | ICD-10-CM

## 2021-08-19 DIAGNOSIS — I2510 Atherosclerotic heart disease of native coronary artery without angina pectoris: Secondary | ICD-10-CM | POA: Diagnosis not present

## 2021-08-19 DIAGNOSIS — I10 Essential (primary) hypertension: Secondary | ICD-10-CM

## 2021-08-19 DIAGNOSIS — E785 Hyperlipidemia, unspecified: Secondary | ICD-10-CM

## 2021-08-19 NOTE — Progress Notes (Signed)
Cardiology Office Note:    Date:  08/19/2021   ID:  [name], DOB 12/17/1955, MRN [id number]  PCP:  Street, Sharon Mt, MD  Cardiologist:  Jenne Campus, MD    Referring MD: Street, Sharon Mt, *   Chief Complaint  Patient presents with   Follow-up  Doing fine still very busy at work  History of Present Illness:    [name] is a 66 y.o. [name]  past medical history significant for coronary artery disease in 2005 he suffered from acute anterior wall myocardial infarction he ended up having V. fib arrest that required defibrillation.  Stent to proximal LAD was placed at that time.  At that time he was found to have cardiomyopathy with diminished ejection fraction 30 to 35%, ICD was implanted which is a Medtronic device.  He is a chronic smoker.  He comes today to my office for follow-up Comes today to my office for follow-up.  He is doing a lot better than last time.  He was complaining of profound weakness fatigue and tiredness.  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  But he started refusing extra shifts and now he seems to be doing better.  Denies have any chest pain tightness squeezing pressure burning chest.  Last time we did proBNP which was normal, we did echocardiogram which show the same finding with ejection fraction 35%.  No swelling of lower extremities no palpitations no dizziness  Past Medical History:  Diagnosis Date   Basal cell epithelioma    "under my left eye"   CAD (coronary artery disease)    s/p anterior wall MI 2005 with VF arrest at tha ttime s/p stent of prox LAD, with repeat intervention at that time for ruptured plque in RCA which had cleared so significantly that PCI was no longer contemplated    CAD, NATIVE VESSEL 03/20/2008   Qualifier: Diagnosis of  By: Caryl Comes, MD, Remus Blake    Chronic systolic congestive heart failure ([place]) 04/14/2017    Coronary artery disease involving native coronary artery of native heart without angina pectoris 11/05/2014   s/p anterior wall MI 2005 with VF arrest at tha ttime s/p stent of prox LAD, with repeat intervention at that time for ruptured plque in RCA which had cleared so significantly that PCI was no longer contemplated  Formatting of this note might be different from the original. Status post anterior wall myocardial infarction stent to LAD in 2005   Dyslipidemia 04/14/2017   H/O heart artery stent 04/14/2017   High cholesterol    Hypertension    Implantable cardioverter-defibrillator (ICD) in situ 03/20/2008   Qualifier: Diagnosis of  By: Caryl Comes, MD, Remus Blake    Ischemic cardiomyopathy    EF 30-35% s/p ICD placement 2005. 6949 lead malfunction/device ERI s/p new lead & generator change (Medtronic) 04/2011.   Late effect of cerebrovascular accident (CVA) 09/01/2018   Old MI (myocardial infarction) 04/14/2017    Past Surgical History:  Procedure Laterality Date   APPENDECTOMY  ~ [place] WITH STENT PLACEMENT  2005   ICD GENERATOR CHANGEOUT N/A 09/09/2020   Procedure: ICD GENERATOR CHANGEOUT;  Surgeon: Constance Haw, MD;  Location: [place] CV LAB;  Service: Cardiovascular;  Laterality: N/A;   ICD placement  2005   ICD replaced  04/30/11   ICD lead replaced; old ICD removed;  new ICD placed   LEAD REVISION N/A 04/30/2011   Procedure: LEAD REVISION;  Surgeon: Evans Lance, MD;  [facility] C[facility] LAB;  Service: Cardiovascular;  Laterality: N/A;   NASAL SEPTUM SURGERY  1988    Current Medications: Current Meds  Medication Sig   albuterol (VENTOLIN HFA) 108 (90 Base) MCG/ACT inhaler Inhale 2 puffs into the lungs every 4 (four) hours as needed for wheezing or shortness of breath.   aspirin 81 MG tablet Take 81 mg by mouth daily.   atorvastatin (LIPITOR) 40 MG tablet TAKE 1 TABLET BY MOUTH EVERY MORNING   carvedilol (COREG) 25 MG tablet Take 1 tablet (25 mg  total) by mouth 2 (two) times daily with a meal.   clopidogrel (PLAVIX) 75 MG tablet Take 1 tablet (75 mg total) by mouth daily.   Coenzyme Q10 (COQ10 PO) Take 1 capsule by mouth in the morning. Unknown strenght   famotidine (PEPCID) 40 MG tablet Take 40 mg by mouth daily.   hydrochlorothiazide (HYDRODIURIL) 25 MG tablet Take 1 tablet (25 mg total) by mouth daily.   Melatonin 3 MG TBDP Take 3 mg by mouth at bedtime as needed (sleep).   nitroGLYCERIN (NITROSTAT) 0.4 MG SL tablet Place 0.4 mg under the tongue every 5 (five) minutes as needed for chest pain.   Omega-3 Fatty Acids (FISH OIL PO) Take 1 capsule by mouth daily. Unknown strenght   ondansetron (ZOFRAN-ODT) 4 MG disintegrating tablet Take 4 mg by mouth every 8 (eight) hours as needed for nausea/vomiting.   potassium chloride SA (KLOR-CON M) 20 MEQ tablet Take 1 tablet (20 mEq total) by mouth daily.   pregabalin (LYRICA) 75 MG capsule Take 75 mg by mouth 2 (two) times daily.   Red Yeast Rice Extract (RED YEAST RICE PO) Take 1 capsule by mouth daily. Unknown strenght   sacubitril-valsartan (ENTRESTO) 24-26 MG Take 1 tablet by mouth 2 (two) times daily.     Allergies:   Spironolactone, Sulfasalazine, Sulfonamide derivatives, and Codeine   Social History   Socioeconomic History   Marital status: Married    Spouse name: Not on file   Number of children: Not on file   Years of education: Not on file   Highest education level: Not on file  Occupational History   Not on file  Tobacco Use   Smoking status: Former    Packs/day: 0.25    Years: 30.00    Total pack years: 7.50    Types: Cigarettes    Quit date: 09/25/2019    Years since quitting: 1.9   Smokeless tobacco: Never   Tobacco comments:    currently wearing a patch  Substance and Sexual Activity   Alcohol use: Yes    Comment: 04/30/11 "2-3 beer q other weekend"   Drug use: No   Sexual activity: Yes  Other Topics Concern   Not on file  Social History Narrative   Not on  file   Social Determinants of Health   Financial Resource Strain: Not on file  Food Insecurity: Not on file  Transportation Needs: Not on file  Physical Activity: Not on file  Stress: Not on file  Social Connections: Not on file     Family History: The patient's family history includes Heart attack in his father. ROS:   Please see the history of present illness.    All 14 point review of systems negative except as described per history of present illness  EKGs/Labs/Other Studies Reviewed:  Recent Labs: 05/14/2021: NT-Pro BNP 252 07/22/2021: ALT 24; B Natriuretic Peptide 51.1; BUN 10; Creatinine, Ser 1.21; Hemoglobin 12.4; Platelets 153; Potassium 4.5; Sodium 132  Recent Lipid Panel    Component Value Date/Time   CHOL 112 11/12/2020 1542   TRIG 112 11/12/2020 1542   HDL 27 (L) 11/12/2020 1542   CHOLHDL 4.1 11/12/2020 1542   LDLCALC 64 11/12/2020 1542    Physical Exam:    VS:  BP 100/70 (BP Location: Left Arm, Patient Position: Sitting, Cuff Size: Normal)   Pulse 78   Ht 5' 5.5" (1.664 m)   Wt 131 lb (59.4 kg)   SpO2 97%   BMI 21.47 kg/m     Wt Readings from Last 3 Encounters:  08/19/21 131 lb (59.4 kg)  05/14/21 136 lb 3.2 oz (61.8 kg)  01/06/21 130 lb 9.6 oz (59.2 kg)     GEN:  Well nourished, well developed in no acute distress HEENT: Normal NECK: No JVD; No carotid bruits LYMPHATICS: No lymphadenopathy CARDIAC: RRR, no murmurs, no rubs, no gallops RESPIRATORY:  Clear to auscultation without rales, wheezing or rhonchi  ABDOMEN: Soft, non-tender, non-distended MUSCULOSKELETAL:  No edema; No deformity  SKIN: Warm and dry LOWER EXTREMITIES: no swelling NEUROLOGIC:  Alert and oriented x 3 PSYCHIATRIC:  Normal affect   ASSESSMENT:    1. Coronary artery disease involving native coronary artery of native heart without angina pectoris   2. Primary hypertension   3. Chronic systolic congestive heart failure ([place])   4. Dyslipidemia   5. ICD (implantable  cardioverter-defibrillator) in place    PLAN:    In order of problems listed above:  Coronary artery disease stable from that point review on antiplatelet therapy which I will continue. Severe cardiomyopathy he is on Coreg 25 twice daily he is also on Entresto however relatively small dose only 2426 because of low blood pressure I will continue.  He does not have any signs and symptoms of decompensation. Dyslipidemia I did review K PN which show me his LDL of 64 HDL 27 he is on high intense statin for of Lipitor which I will continue. ICD present.  I did review interrogation normal function no discharges.   Medication Adjustments/Labs and Tests Ordered: Current medicines are reviewed at length with the patient today.  Concerns regarding medicines are outlined above.  No orders of the defined types were placed in this encounter.  Medication changes: No orders of the defined types were placed in this encounter.   Signed, Park Liter, MD, [facility] 08/19/2021 9:22 AM    [place]

## 2021-09-03 ENCOUNTER — Other Ambulatory Visit: Payer: Self-pay | Admitting: Cardiology

## 2021-09-09 ENCOUNTER — Ambulatory Visit (INDEPENDENT_AMBULATORY_CARE_PROVIDER_SITE_OTHER): Payer: BC Managed Care – PPO

## 2021-09-09 DIAGNOSIS — I255 Ischemic cardiomyopathy: Secondary | ICD-10-CM | POA: Diagnosis not present

## 2021-09-11 LAB — CUP PACEART REMOTE DEVICE CHECK
Battery Remaining Longevity: 131 mo
Battery Voltage: 3.04 V
Brady Statistic RV Percent Paced: 0.01 %
Date Time Interrogation Session: 20230718164227
HighPow Impedance: 59 Ohm
Implantable Lead Implant Date: 20130307
Implantable Lead Location: 753860
Implantable Lead Model: 6935
Implantable Pulse Generator Implant Date: 20220718
Lead Channel Impedance Value: 247 Ohm
Lead Channel Impedance Value: 361 Ohm
Lead Channel Pacing Threshold Amplitude: 0.75 V
Lead Channel Pacing Threshold Pulse Width: 0.4 ms
Lead Channel Sensing Intrinsic Amplitude: 9.5 mV
Lead Channel Sensing Intrinsic Amplitude: 9.5 mV
Lead Channel Setting Pacing Amplitude: 2 V
Lead Channel Setting Pacing Pulse Width: 0.4 ms
Lead Channel Setting Sensing Sensitivity: 0.3 mV

## 2021-10-07 NOTE — Progress Notes (Signed)
Remote ICD transmission.   

## 2021-10-13 ENCOUNTER — Encounter: Payer: Self-pay | Admitting: Cardiology

## 2021-10-13 ENCOUNTER — Other Ambulatory Visit: Payer: Self-pay

## 2021-10-13 ENCOUNTER — Ambulatory Visit (INDEPENDENT_AMBULATORY_CARE_PROVIDER_SITE_OTHER): Payer: 59 | Admitting: Cardiology

## 2021-10-13 VITALS — BP 102/64 | HR 69 | Ht 65.5 in | Wt 139.0 lb

## 2021-10-13 DIAGNOSIS — I255 Ischemic cardiomyopathy: Secondary | ICD-10-CM | POA: Diagnosis not present

## 2021-10-13 DIAGNOSIS — I5022 Chronic systolic (congestive) heart failure: Secondary | ICD-10-CM | POA: Diagnosis not present

## 2021-10-13 LAB — CUP PACEART INCLINIC DEVICE CHECK
Battery Remaining Longevity: 130 mo
Battery Voltage: 3.03 V
Brady Statistic RV Percent Paced: 0.01 %
Date Time Interrogation Session: 20230821114843
HighPow Impedance: 60 Ohm
Implantable Lead Implant Date: 20130307
Implantable Lead Location: 753860
Implantable Lead Model: 6935
Implantable Pulse Generator Implant Date: 20220718
Lead Channel Impedance Value: 247 Ohm
Lead Channel Impedance Value: 342 Ohm
Lead Channel Pacing Threshold Amplitude: 1.125 V
Lead Channel Pacing Threshold Pulse Width: 0.4 ms
Lead Channel Sensing Intrinsic Amplitude: 10.125 mV
Lead Channel Sensing Intrinsic Amplitude: 9.875 mV
Lead Channel Setting Pacing Amplitude: 2.5 V
Lead Channel Setting Pacing Pulse Width: 0.4 ms
Lead Channel Setting Sensing Sensitivity: 0.3 mV

## 2021-10-13 MED ORDER — NITROGLYCERIN 0.4 MG SL SUBL: 0.4000 mg | SUBLINGUAL_TABLET | SUBLINGUAL | 5 refills | Status: DC | PRN

## 2021-10-13 NOTE — Patient Instructions (Signed)
Medication Instructions:  Your physician recommends that you continue on your current medications as directed. Please refer to the Current Medication list given to you today.  *If you need a refill on your cardiac medications before your next appointment, please call your pharmacy*   Lab Work: None ordered If you have labs (blood work) drawn today and your tests are completely normal, you will receive your results only by: [place] (if you have MyChart) OR A paper copy in the mail If you have any lab test that is abnormal or we need to change your treatment, we will call you to review the results.   Testing/Procedures: None ordered   Follow-Up: At [facility], you and your health needs are our priority.  As part of our continuing mission to provide you with exceptional heart care, we have created designated Provider Care Teams.  These Care Teams include your primary Cardiologist (physician) and Advanced Practice Providers (APPs -  Physician Assistants and Nurse Practitioners) who all work together to provide you with the care you need, when you need it.  Remote monitoring is used to monitor your Pacemaker or ICD from home. This monitoring reduces the number of office visits required to check your device to one time per year. It allows [place] to keep an eye on the functioning of your device to ensure it is working properly. You are scheduled for a device check from home on 12/09/2021. You may send your transmission at any time that day. If you have a wireless device, the transmission will be sent automatically. After your physician reviews your transmission, you will receive a postcard with your next transmission date.  Your next appointment:   1 year(s)  The format for your next appointment:   In Person  Provider:   Allegra Lai, MD    Thank you for choosing [facility]!!   Trinidad Curet, RN [phone]    Other Instructions   Important Information About  [place]

## 2021-10-13 NOTE — Progress Notes (Signed)
Electrophysiology Office Note   Date:  10/13/2021   ID:  Cosby, Proby 11-Jul-1955, MRN [id number]  PCP:  Street, Sharon Mt, MD  Cardiologist:  Agustin Cree Primary Electrophysiologist:  Maimuna Leaman Meredith Leeds, MD    No chief complaint on file.     History of Present Illness: [name] is a 66 y.o. male who is being seen today for the evaluation of ischemic cardiomyopathy at the request of Street, Sharon Mt, *. Presenting today for electrophysiology evaluation.    He has a history significant for coronary artery disease, hypertension, hyperlipidemia, chronic systolic heart failure due to ischemic cardiomyopathy.  He is status post Medtronic ICD.  He had a generator change July 2022.  Today, denies symptoms of palpitations, chest pain, shortness of breath, orthopnea, PND, lower extremity edema, claudication, dizziness, presyncope, syncope, bleeding, or neurologic sequela. The patient is tolerating medications without difficulties.  He is overall doing well.  He has no major complaints.  He states that he is quit smoking.  He notices that he feels improved with less shortness of breath.   Past Medical History:  Diagnosis Date   Basal cell epithelioma    "under my left eye"   CAD (coronary artery disease)    s/p anterior wall MI 2005 with VF arrest at tha ttime s/p stent of prox LAD, with repeat intervention at that time for ruptured plque in RCA which had cleared so significantly that PCI was no longer contemplated    CAD, NATIVE VESSEL 03/20/2008   Qualifier: Diagnosis of  By: Caryl Comes, MD, Remus Blake    Chronic systolic congestive heart failure ([place]) 04/14/2017   Coronary artery disease involving native coronary artery of native heart without angina pectoris 11/05/2014   s/p anterior wall MI 2005 with VF arrest at tha ttime s/p stent of prox LAD, with repeat intervention at that time for ruptured plque in RCA which had cleared so significantly that PCI was  no longer contemplated  Formatting of this note might be different from the original. Status post anterior wall myocardial infarction stent to LAD in 2005   Dyslipidemia 04/14/2017   H/O heart artery stent 04/14/2017   High cholesterol    Hypertension    Implantable cardioverter-defibrillator (ICD) in situ 03/20/2008   Qualifier: Diagnosis of  By: Caryl Comes, MD, Remus Blake    Ischemic cardiomyopathy    EF 30-35% s/p ICD placement 2005. 6949 lead malfunction/device ERI s/p new lead & generator change (Medtronic) 04/2011.   Late effect of cerebrovascular accident (CVA) 09/01/2018   Old MI (myocardial infarction) 04/14/2017   Past Surgical History:  Procedure Laterality Date   APPENDECTOMY  ~ [place] WITH STENT PLACEMENT  2005   ICD GENERATOR CHANGEOUT N/A 09/09/2020   Procedure: ICD GENERATOR CHANGEOUT;  Surgeon: Constance Haw, MD;  Location: [place] [facility];  Service: Cardiovascular;  Laterality: N/A;   ICD placement  2005   ICD replaced  04/30/11   ICD lead replaced; old ICD removed; new ICD placed   LEAD REVISION N/A 04/30/2011   Procedure: LEAD REVISION;  Surgeon: Evans Lance, MD;  Location: [facility] CATH LAB;  Service: Cardiovascular;  Laterality: N/A;   NASAL SEPTUM SURGERY  1988     Current Outpatient Medications  Medication Sig Dispense Refill   albuterol (VENTOLIN HFA) 108 (90 Base) MCG/ACT inhaler Inhale 2 puffs into the lungs every 4 (four) hours as needed for wheezing or shortness of breath.  aspirin 81 MG tablet Take 81 mg by mouth daily.     atorvastatin (LIPITOR) 40 MG tablet TAKE 1 TABLET BY MOUTH EVERY MORNING 90 tablet 1   carvedilol (COREG) 25 MG tablet Take 1 tablet (25 mg total) by mouth 2 (two) times daily with a meal. 180 tablet 3   clopidogrel (PLAVIX) 75 MG tablet Take 1 tablet (75 mg total) by mouth daily. 90 tablet 2   Coenzyme Q10 (COQ10 PO) Take 1 capsule by mouth in the morning. Unknown strenght     famotidine (PEPCID) 40 MG  tablet Take 40 mg by mouth daily.     hydrochlorothiazide (HYDRODIURIL) 25 MG tablet Take 1 tablet (25 mg total) by mouth daily. 90 tablet 2   Melatonin 3 MG TBDP Take 3 mg by mouth at bedtime as needed (sleep).     nitroGLYCERIN (NITROSTAT) 0.4 MG SL tablet Place 0.4 mg under the tongue every 5 (five) minutes as needed for chest pain.     Omega-3 Fatty Acids (FISH OIL PO) Take 1 capsule by mouth daily. Unknown strenght     ondansetron (ZOFRAN-ODT) 4 MG disintegrating tablet Take 4 mg by mouth every 8 (eight) hours as needed for nausea/vomiting.     potassium chloride SA (KLOR-CON M) 20 MEQ tablet Take 1 tablet (20 mEq total) by mouth daily. 4 tablet 0   pregabalin (LYRICA) 75 MG capsule Take 75 mg by mouth 2 (two) times daily.     Red Yeast Rice Extract (RED YEAST RICE PO) Take 1 capsule by mouth daily. Unknown strenght     sacubitril-valsartan (ENTRESTO) 24-26 MG Take 1 tablet by mouth 2 (two) times daily. 180 tablet 1   No current facility-administered medications for this visit.    Allergies:   Spironolactone, Sulfasalazine, Sulfonamide derivatives, and Codeine   Social History:  The patient  reports that he quit smoking about 2 years ago. His smoking use included cigarettes. He has a 7.50 pack-year smoking history. He has never used smokeless tobacco. He reports current alcohol use. He reports that he does not use drugs.   Family History:  The patient's family history includes Heart attack in his father.   ROS:  Please see the history of present illness.   Otherwise, review of systems is positive for none.   All other systems are reviewed and negative.   PHYSICAL EXAM: VS:  BP 102/64   Pulse 69   Ht 5' 5.5" (1.664 m)   Wt 134 lb (60.8 kg)   SpO2 93%   BMI 21.96 kg/m  , BMI Body mass index is 21.96 kg/m. GEN: Well nourished, well developed, in no acute distress  HEENT: normal  Neck: no JVD, carotid bruits, or masses Cardiac: RRR; no murmurs, rubs, or gallops,no edema   Respiratory:  clear to auscultation bilaterally, normal work of breathing GI: soft, nontender, nondistended, + BS MS: no deformity or atrophy  Skin: warm and dry, device site well healed Neuro:  Strength and sensation are intact Psych: euthymic mood, full affect  EKG:  EKG is not ordered today. Personal review of the ekg ordered 07/23/21 shows sinus rhythm, anteroseptal infarct, rate 73  Personal review of the device interrogation today. Results in [facility]: 05/14/2021: NT-Pro BNP 252 07/22/2021: ALT 24; B Natriuretic Peptide 51.1; BUN 10; Creatinine, Ser 1.21; Hemoglobin 12.4; Platelets 153; Potassium 4.5; Sodium 132    Lipid Panel     Component Value Date/Time   CHOL 112 11/12/2020 1542  TRIG 112 11/12/2020 1542   HDL 27 (L) 11/12/2020 1542   CHOLHDL 4.1 11/12/2020 1542   LDLCALC 64 11/12/2020 1542     Wt Readings from Last 3 Encounters:  10/13/21 134 lb (60.8 kg)  08/19/21 131 lb (59.4 kg)  05/14/21 136 lb 3.2 oz (61.8 kg)      Other studies Reviewed: Additional studies/ records that were reviewed today include: TTE 05/23/21  Review of the above records today demonstrates:   1. Left ventricular ejection fraction, by estimation, is 35 to 40%. The  left ventricle has moderately decreased function. The left ventricle  demonstrates regional wall motion abnormalities (see scoring  diagram/findings for description). Left ventricular   diastolic parameters were normal. There is severe hypokinesis of the left  ventricular, mid-apical anterior wall. There is moderate hypokinesis of  the left ventricular, mid-apical septal wall.   2. Right ventricular systolic function is normal. The right ventricular  size is normal. There is normal pulmonary artery systolic pressure.   3. The mitral valve is normal in structure. Mild mitral valve  regurgitation. No evidence of mitral stenosis.   4. The aortic valve is normal in structure. Aortic valve regurgitation is  not  visualized. No aortic stenosis is present.   5. The inferior vena cava is normal in size with greater than 50%  respiratory variability, suggesting right atrial pressure of 3 mmHg.    ASSESSMENT AND PLAN:  1.  Chronic systolic heart failure due to ischemic cardiomyopathy: Currently on Entresto 24/26 mg twice daily, carvedilol 25 mg twice daily.  Status post Medtronic ICD with generator change 09/09/2020.  Device function appropriately.  No changes at this time.  2.  Coronary artery disease: No current chest pain  3.  Hyperlipidemia: Continue atorvastatin with goal LDL less than 70  4.  Tobacco abuse: Complete cessation encouraged.  He has quit smoking and has been doing well.   Current medicines are reviewed at length with the patient today.   The patient does not have concerns regarding his medicines.  The following changes were made today: none  Labs/ tests ordered today include:  No orders of the defined types were placed in this encounter.      Disposition:   FU with Teighlor Korson 12 months  Signed, Kendal Raffo Meredith Leeds, MD  10/13/2021 10:02 AM     [facility] [address] [place] [address] [phone] (office) [phone] (fax)

## 2021-11-14 DIAGNOSIS — I255 Ischemic cardiomyopathy: Secondary | ICD-10-CM

## 2021-11-14 DIAGNOSIS — I214 Non-ST elevation (NSTEMI) myocardial infarction: Secondary | ICD-10-CM | POA: Diagnosis not present

## 2021-11-14 DIAGNOSIS — I10 Essential (primary) hypertension: Secondary | ICD-10-CM | POA: Diagnosis not present

## 2021-11-14 DIAGNOSIS — I361 Nonrheumatic tricuspid (valve) insufficiency: Secondary | ICD-10-CM | POA: Diagnosis not present

## 2021-11-14 DIAGNOSIS — E785 Hyperlipidemia, unspecified: Secondary | ICD-10-CM | POA: Diagnosis not present

## 2021-11-14 DIAGNOSIS — Z95810 Presence of automatic (implantable) cardiac defibrillator: Secondary | ICD-10-CM

## 2021-11-14 DIAGNOSIS — F17210 Nicotine dependence, cigarettes, uncomplicated: Secondary | ICD-10-CM | POA: Diagnosis not present

## 2021-11-15 DIAGNOSIS — Z95810 Presence of automatic (implantable) cardiac defibrillator: Secondary | ICD-10-CM

## 2021-11-15 DIAGNOSIS — I214 Non-ST elevation (NSTEMI) myocardial infarction: Secondary | ICD-10-CM | POA: Diagnosis not present

## 2021-11-15 DIAGNOSIS — I959 Hypotension, unspecified: Secondary | ICD-10-CM | POA: Diagnosis not present

## 2021-11-15 DIAGNOSIS — E785 Hyperlipidemia, unspecified: Secondary | ICD-10-CM

## 2021-11-15 DIAGNOSIS — I255 Ischemic cardiomyopathy: Secondary | ICD-10-CM

## 2021-11-15 DIAGNOSIS — J449 Chronic obstructive pulmonary disease, unspecified: Secondary | ICD-10-CM | POA: Diagnosis not present

## 2021-11-15 DIAGNOSIS — I252 Old myocardial infarction: Secondary | ICD-10-CM | POA: Diagnosis not present

## 2021-11-16 DIAGNOSIS — J449 Chronic obstructive pulmonary disease, unspecified: Secondary | ICD-10-CM | POA: Diagnosis not present

## 2021-11-16 DIAGNOSIS — I959 Hypotension, unspecified: Secondary | ICD-10-CM | POA: Diagnosis not present

## 2021-11-16 DIAGNOSIS — I252 Old myocardial infarction: Secondary | ICD-10-CM | POA: Diagnosis not present

## 2021-11-16 DIAGNOSIS — I214 Non-ST elevation (NSTEMI) myocardial infarction: Secondary | ICD-10-CM | POA: Diagnosis not present

## 2021-11-17 ENCOUNTER — Telehealth: Payer: Self-pay

## 2021-11-17 ENCOUNTER — Encounter: Payer: Self-pay | Admitting: Student

## 2021-11-17 DIAGNOSIS — I252 Old myocardial infarction: Secondary | ICD-10-CM | POA: Diagnosis not present

## 2021-11-17 DIAGNOSIS — R778 Other specified abnormalities of plasma proteins: Secondary | ICD-10-CM | POA: Diagnosis not present

## 2021-11-17 DIAGNOSIS — J449 Chronic obstructive pulmonary disease, unspecified: Secondary | ICD-10-CM | POA: Diagnosis not present

## 2021-11-17 DIAGNOSIS — I959 Hypotension, unspecified: Secondary | ICD-10-CM | POA: Diagnosis not present

## 2021-11-17 DIAGNOSIS — I214 Non-ST elevation (NSTEMI) myocardial infarction: Secondary | ICD-10-CM | POA: Diagnosis not present

## 2021-11-17 NOTE — Telephone Encounter (Signed)
Device alert for AF Event occurred 9/21, duration 19hrs 50mn, controlled rates Burden 2.5%, no OAC Route to triage.  Routing to Dr. CCurt Bearsto verify if AF prior to AF clinic referral.

## 2021-11-17 NOTE — Telephone Encounter (Signed)
Called patient, states he is currently admitted at [facility] and has since been placed on xarelto by Dr. Agustin Cree. States he may be released this afternoon. Advised to call if he has any questions or concerns.

## 2021-11-18 ENCOUNTER — Other Ambulatory Visit: Payer: Self-pay | Admitting: Cardiology

## 2021-11-19 ENCOUNTER — Telehealth: Payer: Self-pay

## 2021-11-19 NOTE — Telephone Encounter (Signed)
Prior Auth for [employer]  through cover my meds and sent to [facility]. Confirmation#  [id number].

## 2021-12-02 ENCOUNTER — Other Ambulatory Visit: Payer: Self-pay | Admitting: Cardiology

## 2021-12-04 ENCOUNTER — Telehealth: Payer: Self-pay | Admitting: Cardiology

## 2021-12-04 NOTE — Telephone Encounter (Signed)
Pt c/o medication issue:  1. Name of Medication: Eliquis  and carvedilol (COREG) 25 MG tablet  2. How are you currently taking this medication (dosage and times per day)?   3. Are you having a reaction (difficulty breathing--STAT)?   4. What is your medication issue? Pt has question about medications that was both prescribed to him in hosp and medication prescribed by Dr. Agustin Cree.  Requesting call back.

## 2021-12-09 ENCOUNTER — Ambulatory Visit (INDEPENDENT_AMBULATORY_CARE_PROVIDER_SITE_OTHER): Payer: 59

## 2021-12-09 DIAGNOSIS — I255 Ischemic cardiomyopathy: Secondary | ICD-10-CM | POA: Diagnosis not present

## 2021-12-09 LAB — CUP PACEART REMOTE DEVICE CHECK
Battery Remaining Longevity: 129 mo
Battery Voltage: 3.03 V
Brady Statistic RV Percent Paced: 0.01 %
Date Time Interrogation Session: 20231017072207
HighPow Impedance: 57 Ohm
Implantable Lead Implant Date: 20130307
Implantable Lead Location: 753860
Implantable Lead Model: 6935
Implantable Pulse Generator Implant Date: 20220718
Lead Channel Impedance Value: 228 Ohm
Lead Channel Impedance Value: 304 Ohm
Lead Channel Pacing Threshold Amplitude: 1 V
Lead Channel Pacing Threshold Pulse Width: 0.4 ms
Lead Channel Sensing Intrinsic Amplitude: 8.75 mV
Lead Channel Sensing Intrinsic Amplitude: 8.75 mV
Lead Channel Setting Pacing Amplitude: 2.25 V
Lead Channel Setting Pacing Pulse Width: 0.4 ms
Lead Channel Setting Sensing Sensitivity: 0.3 mV

## 2021-12-10 ENCOUNTER — Encounter: Payer: Self-pay | Admitting: Cardiology

## 2021-12-10 ENCOUNTER — Encounter: Payer: Self-pay | Admitting: *Deleted

## 2021-12-10 ENCOUNTER — Telehealth: Payer: Self-pay

## 2021-12-10 ENCOUNTER — Ambulatory Visit: Payer: 59 | Admitting: Cardiology

## 2021-12-10 NOTE — Telephone Encounter (Signed)
I reached out to the patient and informed him that he needs to be taking both the Carvedilol and either Eliquis or Xarelto for the Afib.     He was just wondering how long he needs to be taking these medications.    I told him to discuss 10/20 with Dr Agustin Cree at his visit.  He verbalized understanding and thanked me for the call.

## 2021-12-12 ENCOUNTER — Encounter: Payer: Self-pay | Admitting: Cardiology

## 2021-12-12 ENCOUNTER — Ambulatory Visit: Payer: 59 | Attending: Cardiology | Admitting: Cardiology

## 2021-12-12 VITALS — BP 110/70 | HR 70 | Ht 65.5 in | Wt 139.6 lb

## 2021-12-12 DIAGNOSIS — Z95810 Presence of automatic (implantable) cardiac defibrillator: Secondary | ICD-10-CM | POA: Diagnosis not present

## 2021-12-12 DIAGNOSIS — I480 Paroxysmal atrial fibrillation: Secondary | ICD-10-CM

## 2021-12-12 DIAGNOSIS — I255 Ischemic cardiomyopathy: Secondary | ICD-10-CM

## 2021-12-12 DIAGNOSIS — I2510 Atherosclerotic heart disease of native coronary artery without angina pectoris: Secondary | ICD-10-CM

## 2021-12-12 DIAGNOSIS — E785 Hyperlipidemia, unspecified: Secondary | ICD-10-CM

## 2021-12-12 HISTORY — DX: Paroxysmal atrial fibrillation: I48.0

## 2021-12-12 MED ORDER — CARVEDILOL 6.25 MG PO TABS: 6.2500 mg | ORAL_TABLET | Freq: Two times a day (BID) | ORAL | 3 refills | Status: DC

## 2021-12-12 MED ORDER — ELIQUIS 5 MG PO TABS: 5.0000 mg | ORAL_TABLET | Freq: Two times a day (BID) | ORAL | 3 refills | Status: DC

## 2021-12-12 NOTE — Addendum Note (Signed)
Addended by: Jacobo Forest D on: 12/12/2021 11:19 AM   Modules accepted: Orders

## 2021-12-12 NOTE — Addendum Note (Signed)
Addended by: Darrel Reach on: 12/12/2021 03:52 PM   Modules accepted: Orders

## 2021-12-12 NOTE — Patient Instructions (Addendum)
Medication Instructions:  Your physician has recommended you make the following change in your medication:   INCREASE: Carvedilol to 6.25 twice daily - you may double your current and then your next refill will reflect your new dose.   Lab Work: Magnesium, BMP, Direct LDL- today If you have labs (blood work) drawn today and your tests are completely normal, you will receive your results only by: [place] (if you have MyChart) OR A paper copy in the mail If you have any lab test that is abnormal or we need to change your treatment, we will call you to review the results.   Testing/Procedures: None Ordered   Follow-Up: At [facility], you and your health needs are our priority.  As part of our continuing mission to provide you with exceptional heart care, we have created designated Provider Care Teams.  These Care Teams include your primary Cardiologist (physician) and Advanced Practice Providers (APPs -  Physician Assistants and Nurse Practitioners) who all work together to provide you with the care you need, when you need it.  We recommend signing up for the patient portal called "MyChart".  Sign up information is provided on this After Visit Summary.  MyChart is used to connect with patients for Virtual Visits (Telemedicine).  Patients are able to view lab/test results, encounter notes, upcoming appointments, etc.  Non-urgent messages can be sent to your provider as well.   To learn more about what you can do with MyChart, go to [email or web address].    Your next appointment:   Keep December Appt  The format for your next appointment:   In Person  Provider:   Jenne Campus, MD    Other Instructions NA

## 2021-12-12 NOTE — Progress Notes (Signed)
Cardiology Office Note:    Date:  12/12/2021   ID:  [name], DOB 19-Oct-1955, MRN [id number]  PCP:  Street, Sharon Mt, MD  Cardiologist:  Jenne Campus, MD    Referring MD: Street, Sharon Mt, *   Chief Complaint  Patient presents with   follow up RH    BP doing well    History of Present Illness:    [name] is a 66 y.o. male  past medical history significant for coronary artery disease in 2005 he suffered from acute anterior wall myocardial infarction he ended up having V. fib arrest that required defibrillation.  Stent to proximal LAD was placed at that time.  At that time he was found to have cardiomyopathy with diminished ejection fraction 30 to 35%, ICD was implanted which is a Medtronic device.  He is a chronic smoker.  He comes today to my office for follow-up He recently ended up being in the [facility] he went there because he felt poorly he did have some nausea vomiting and diarrhea he was hypotensive a lot of medication has been modified.  He is also find to be in atrial fibrillation, he converted spontaneously to sinus rhythm anticoagulation has been initiated.  He is in my office today doing well.  Still continues to work denies have any chest pain tightness squeezing pressure burning chest no palpitations  Past Medical History:  Diagnosis Date   Basal cell epithelioma    "under my left eye"   CAD (coronary artery disease)    s/p anterior wall MI 2005 with VF arrest at tha ttime s/p stent of prox LAD, with repeat intervention at that time for ruptured plque in RCA which had cleared so significantly that PCI was no longer contemplated    CAD, NATIVE VESSEL 03/20/2008   Qualifier: Diagnosis of  By: Caryl Comes, MD, Remus Blake    Chronic systolic congestive heart failure ([place]) 04/14/2017   Coronary artery disease involving native coronary artery of native heart without angina pectoris 11/05/2014   s/p anterior wall MI 2005 with VF arrest  at tha ttime s/p stent of prox LAD, with repeat intervention at that time for ruptured plque in RCA which had cleared so significantly that PCI was no longer contemplated  Formatting of this note might be different from the original. Status post anterior wall myocardial infarction stent to LAD in 2005   Dyslipidemia 04/14/2017   H/O heart artery stent 04/14/2017   High cholesterol    Hypertension    Implantable cardioverter-defibrillator (ICD) in situ 03/20/2008   Qualifier: Diagnosis of  By: Caryl Comes, MD, Remus Blake    Ischemic cardiomyopathy    EF 30-35% s/p ICD placement 2005. 6949 lead malfunction/device ERI s/p new lead & generator change (Medtronic) 04/2011.   Late effect of cerebrovascular accident (CVA) 09/01/2018   Old MI (myocardial infarction) 04/14/2017    Past Surgical History:  Procedure Laterality Date   APPENDECTOMY  ~ [place] WITH STENT PLACEMENT  2005   ICD GENERATOR CHANGEOUT N/A 09/09/2020   Procedure: ICD GENERATOR CHANGEOUT;  Surgeon: Constance Haw, MD;  Location: [place] [facility];  Service: Cardiovascular;  Laterality: N/A;   ICD placement  2005   ICD replaced  04/30/11   ICD lead replaced; old ICD removed; new ICD placed   LEAD REVISION N/A 04/30/2011   Procedure: LEAD REVISION;  Surgeon: Evans Lance, MD;  Location: [facility] CATH LAB;  Service: Cardiovascular;  Laterality:  N/A;   NASAL SEPTUM SURGERY  1988    Current Medications: Current Meds  Medication Sig   albuterol (VENTOLIN HFA) 108 (90 Base) MCG/ACT inhaler Inhale 2 puffs into the lungs every 4 (four) hours as needed for wheezing or shortness of breath.   atorvastatin (LIPITOR) 40 MG tablet TAKE 1 TABLET BY MOUTH EVERY MORNING (Patient taking differently: Take 40 mg by mouth daily.)   carvedilol (COREG) 3.125 MG tablet Take 3.125 mg by mouth 2 (two) times daily.   clopidogrel (PLAVIX) 75 MG tablet Take 1 tablet (75 mg total) by mouth daily.   Coenzyme Q10 (COQ10) 100 MG CAPS Take  100 mg by mouth daily.   ELIQUIS 5 MG TABS tablet Take 5 mg by mouth 2 (two) times daily.   famotidine (PEPCID) 40 MG tablet Take 40 mg by mouth as needed for heartburn or indigestion.   fluticasone-salmeterol (ADVAIR) 500-50 MCG/ACT AEPB Inhale 1 puff into the lungs in the morning and at bedtime.   furosemide (LASIX) 20 MG tablet Take 20 mg by mouth as needed for edema.   Magnesium 400 MG TABS Take 400 mg by mouth daily at 6 (six) AM.   Melatonin 3 MG TBDP Take 3 mg by mouth at bedtime as needed (sleep).   montelukast (SINGULAIR) 10 MG tablet Take 10 mg by mouth at bedtime.   nitroGLYCERIN (NITROSTAT) 0.4 MG SL tablet Place 1 tablet (0.4 mg total) under the tongue every 5 (five) minutes as needed for chest pain.   Omega-3 Fatty Acids (FISH OIL PO) Take 1 capsule by mouth daily. Unknown strenght   ondansetron (ZOFRAN) 4 MG tablet Take 4 mg by mouth every 8 (eight) hours as needed for nausea or vomiting.   ondansetron (ZOFRAN-ODT) 4 MG disintegrating tablet Take 4 mg by mouth every 8 (eight) hours as needed for nausea/vomiting.   potassium chloride SA (KLOR-CON M) 20 MEQ tablet Take 1 tablet (20 mEq total) by mouth daily.   pregabalin (LYRICA) 75 MG capsule Take 75 mg by mouth 2 (two) times daily.   Red Yeast Rice Extract (RED YEAST RICE PO) Take 1 capsule by mouth daily. Unknown strenght   sacubitril-valsartan (ENTRESTO) 24-26 MG Take 1 tablet by mouth 2 (two) times daily.     Allergies:   Spironolactone, Sulfasalazine, Sulfonamide derivatives, and Codeine   Social History   Socioeconomic History   Marital status: Married    Spouse name: Not on file   Number of children: Not on file   Years of education: Not on file   Highest education level: Not on file  Occupational History   Not on file  Tobacco Use   Smoking status: Former    Packs/day: 0.25    Years: 30.00    Total pack years: 7.50    Types: Cigarettes    Quit date: 09/25/2019    Years since quitting: 2.2   Smokeless tobacco:  Never   Tobacco comments:    currently wearing a patch  Substance and Sexual Activity   Alcohol use: Yes    Comment: Rare   Drug use: No   Sexual activity: Yes  Other Topics Concern   Not on file  Social History Narrative   Not on file   Social Determinants of Health   Financial Resource Strain: Not on file  Food Insecurity: Not on file  Transportation Needs: Not on file  Physical Activity: Not on file  Stress: Not on file  Social Connections: Not on file  Family History: The patient's family history includes Heart attack in his father. ROS:   Please see the history of present illness.    All 14 point review of systems negative except as described per history of present illness  EKGs/Labs/Other Studies Reviewed:      Recent Labs: 05/14/2021: NT-Pro BNP 252 07/22/2021: ALT 24; B Natriuretic Peptide 51.1; BUN 10; Creatinine, Ser 1.21; Hemoglobin 12.4; Platelets 153; Potassium 4.5; Sodium 132  Recent Lipid Panel    Component Value Date/Time   CHOL 112 11/12/2020 1542   TRIG 112 11/12/2020 1542   HDL 27 (L) 11/12/2020 1542   CHOLHDL 4.1 11/12/2020 1542   LDLCALC 64 11/12/2020 1542    Physical Exam:    VS:  BP 110/70 (BP Location: Left Arm, Patient Position: Sitting)   Pulse 70   Ht 5' 5.5" (1.664 m)   Wt 139 lb 9.6 oz (63.3 kg)   SpO2 96%   BMI 22.88 kg/m     Wt Readings from Last 3 Encounters:  12/12/21 139 lb 9.6 oz (63.3 kg)  10/13/21 139 lb (63 kg)  08/19/21 131 lb (59.4 kg)     GEN:  Well nourished, well developed in no acute distress HEENT: Normal NECK: No JVD; No carotid bruits LYMPHATICS: No lymphadenopathy CARDIAC: RRR, no murmurs, no rubs, no gallops RESPIRATORY:  Clear to auscultation without rales, wheezing or rhonchi  ABDOMEN: Soft, non-tender, non-distended MUSCULOSKELETAL:  No edema; No deformity  SKIN: Warm and dry LOWER EXTREMITIES: no swelling NEUROLOGIC:  Alert and oriented x 3 PSYCHIATRIC:  Normal affect   ASSESSMENT:    1.  Atherosclerosis of native coronary artery of native heart without angina pectoris   2. Paroxysmal atrial fibrillation (HCC)   3. Ischemic cardiomyopathy   4. ICD (implantable cardioverter-defibrillator) in place   5. Dyslipidemia    PLAN:    In order of problems listed above:  Coronary artery disease advanced.  She is on antiplatelet therapy which I will continue.  Denies have any symptoms that would suggest reactivation of the problem Paroxysmal atrial fibrillation at this time new diagnosis.  He is anticoagulated denies have any palpitation I will have interrogation of his device to see if he have any recurrences of this arrhythmia Ischemic cardiomyopathy is hemodynamically stable on guideline directed medical therapy.  I will try to increase the dose of his carvedilol today to 6.25 twice daily, I will check his magnesium as well as potassium today. ICD implants present normal function no recent discharges Dyslipidemia he is taking high intense statin which I will continue.  I did review K PN I have data from last year with HDL 27 LDL 64.  We will make arrangements for fasting lipid profile to be rechecked   Medication Adjustments/Labs and Tests Ordered: Current medicines are reviewed at length with the patient today.  Concerns regarding medicines are outlined above.  No orders of the defined types were placed in this encounter.  Medication changes: No orders of the defined types were placed in this encounter.   Signed, Park Liter, MD, [facility] 12/12/2021 11:06 AM    [place]

## 2021-12-13 LAB — BASIC METABOLIC PANEL
BUN/Creatinine Ratio: 14 (ref 10–24)
BUN: 18 mg/dL (ref 8–27)
CO2: 25 mmol/L (ref 20–29)
Calcium: 9.2 mg/dL (ref 8.6–10.2)
Chloride: 92 mmol/L — ABNORMAL LOW (ref 96–106)
Creatinine, Ser: 1.29 mg/dL — ABNORMAL HIGH (ref 0.76–1.27)
Glucose: 90 mg/dL (ref 70–99)
Potassium: 4.9 mmol/L (ref 3.5–5.2)
Sodium: 129 mmol/L — ABNORMAL LOW (ref 134–144)
eGFR: 62 mL/min/{1.73_m2} (ref >59)

## 2021-12-13 LAB — MAGNESIUM: Magnesium: 1.8 mg/dL (ref 1.6–2.3)

## 2021-12-13 LAB — LDL CHOLESTEROL, DIRECT: LDL Direct: 64 mg/dL (ref 0–99)

## 2021-12-17 ENCOUNTER — Telehealth: Payer: Self-pay

## 2021-12-17 NOTE — Telephone Encounter (Signed)
LVM and My Chart message regarding lab results per Dr. Wendy Poet note.  Routed to PCP.

## 2021-12-24 NOTE — Progress Notes (Signed)
Remote ICD transmission.   

## 2022-02-12 ENCOUNTER — Ambulatory Visit: Payer: BC Managed Care – PPO | Admitting: Cardiology

## 2022-02-20 ENCOUNTER — Ambulatory Visit: Payer: BC Managed Care – PPO | Admitting: Cardiology

## 2022-03-02 ENCOUNTER — Other Ambulatory Visit: Payer: Self-pay | Admitting: Cardiology

## 2022-03-10 ENCOUNTER — Ambulatory Visit: Payer: 59 | Attending: Cardiology

## 2022-03-10 DIAGNOSIS — I255 Ischemic cardiomyopathy: Secondary | ICD-10-CM | POA: Diagnosis not present

## 2022-03-10 LAB — CUP PACEART REMOTE DEVICE CHECK
Battery Remaining Longevity: 127 mo
Battery Voltage: 3.03 V
Brady Statistic RV Percent Paced: 0.01 %
Date Time Interrogation Session: 20240116121705
HighPow Impedance: 66 Ohm
Implantable Lead Connection Status: 753985
Implantable Lead Implant Date: 20130307
Implantable Lead Location: 753860
Implantable Lead Model: 6935
Implantable Pulse Generator Implant Date: 20220718
Lead Channel Impedance Value: 285 Ohm
Lead Channel Impedance Value: 361 Ohm
Lead Channel Pacing Threshold Amplitude: 0.875 V
Lead Channel Pacing Threshold Pulse Width: 0.4 ms
Lead Channel Sensing Intrinsic Amplitude: 8.75 mV
Lead Channel Sensing Intrinsic Amplitude: 8.75 mV
Lead Channel Setting Pacing Amplitude: 2 V
Lead Channel Setting Pacing Pulse Width: 0.4 ms
Lead Channel Setting Sensing Sensitivity: 0.3 mV
Zone Setting Status: 755011
Zone Setting Status: 755011

## 2022-03-13 ENCOUNTER — Encounter: Payer: Self-pay | Admitting: Cardiology

## 2022-03-13 ENCOUNTER — Telehealth: Payer: Self-pay

## 2022-03-13 ENCOUNTER — Ambulatory Visit: Payer: 59 | Attending: Cardiology | Admitting: Cardiology

## 2022-03-13 VITALS — BP 120/80 | HR 80 | Ht 65.0 in | Wt 134.4 lb

## 2022-03-13 DIAGNOSIS — I5022 Chronic systolic (congestive) heart failure: Secondary | ICD-10-CM | POA: Diagnosis not present

## 2022-03-13 DIAGNOSIS — I255 Ischemic cardiomyopathy: Secondary | ICD-10-CM | POA: Diagnosis not present

## 2022-03-13 DIAGNOSIS — I6930 Unspecified sequelae of cerebral infarction: Secondary | ICD-10-CM

## 2022-03-13 DIAGNOSIS — E785 Hyperlipidemia, unspecified: Secondary | ICD-10-CM

## 2022-03-13 DIAGNOSIS — I2510 Atherosclerotic heart disease of native coronary artery without angina pectoris: Secondary | ICD-10-CM

## 2022-03-13 DIAGNOSIS — Z95810 Presence of automatic (implantable) cardiac defibrillator: Secondary | ICD-10-CM

## 2022-03-13 MED ORDER — FUROSEMIDE 20 MG PO TABS: 20.0000 mg | ORAL_TABLET | ORAL | 3 refills | Status: AC | PRN

## 2022-03-13 NOTE — Patient Instructions (Addendum)

## 2022-03-13 NOTE — Telephone Encounter (Signed)
-----  Message from Tyler Pita, RN sent at 03/13/2022  1:29 PM EST ----- Per Dr. Agustin Cree- Pt had an event on 03-11-22 after interrogation on the 16th- Can we get interrogation today?? I will have pt go home and send a transmission.   Thank you, Dede

## 2022-03-13 NOTE — Telephone Encounter (Signed)
Pt had trouble transmitting data for interrogation. 2  techs called him to help troubleshoot. He will need a new transmitting device. Spoke with Leanna Sato from [employer]. He will meet the pt in the [facility] office Monday Jan 22 to interrogate device. Pt agreed and had no further questions.

## 2022-03-13 NOTE — Progress Notes (Signed)
Cardiology Office Note:    Date:  03/13/2022   ID:  [name], DOB 1955/06/25, MRN [id number]  PCP:  [name], [name] Mt, MD  Cardiologist:  [name] Campus, MD    Referring MD: [name], [name] Mt, *   Chief Complaint  Patient presents with   [facility] follow up    Seizures sx's    History of Present Illness:    [name] is a 67 y.o. male    past medical history significant for coronary artery disease in 2005 he suffered from acute anterior wall myocardial infarction he ended up having V. fib arrest that required defibrillation.  Stent to proximal LAD was placed at that time.  At that time he was found to have cardiomyopathy with diminished ejection fraction 30 to 35%, ICD was implanted which is a Medtronic device.  He is a chronic smoker.  He comes today to my office for follow-up  He comes today to months for follow-up 2 days ago he end up going to the emergency room something strange happened to him he did not feel well he went to the kitchen trying to eat something and drink something thinking that maybe his magnesium was low then he developed strength sensation) he started moaning his wife heard that she went to the room and she saw him standing status with his right hand shaking then he has had to lean towards the left side and started shaking as well he was moaning during the time.  However he was standing he did not fell down.  She started praying and then everything comes back to normal since that time 0 trouble he feels absolutely fine evaluation in the emergency room was unrevealing CT of his head was negative.  He was given diagnosis of potential seizures.  Past Medical History:  Diagnosis Date   Basal cell epithelioma    "under my left eye"   CAD (coronary artery disease)    s/p anterior wall MI 2005 with VF arrest at tha ttime s/p stent of prox LAD, with repeat intervention at that time for ruptured plque in RCA which had cleared so significantly that PCI was  no longer contemplated    CAD, NATIVE VESSEL 03/20/2008   Qualifier: Diagnosis of  By: Caryl Comes, MD, Remus Blake    Chronic systolic congestive heart failure ([place]) 04/14/2017   Coronary artery disease involving native coronary artery of native heart without angina pectoris 11/05/2014   s/p anterior wall MI 2005 with VF arrest at tha ttime s/p stent of prox LAD, with repeat intervention at that time for ruptured plque in RCA which had cleared so significantly that PCI was no longer contemplated  Formatting of this note might be different from the original. Status post anterior wall myocardial infarction stent to LAD in 2005   Dyslipidemia 04/14/2017   H/O heart artery stent 04/14/2017   High cholesterol    Hypertension    Implantable cardioverter-defibrillator (ICD) in situ 03/20/2008   Qualifier: Diagnosis of  By: Caryl Comes, MD, Remus Blake    Ischemic cardiomyopathy    EF 30-35% s/p ICD placement 2005. 6949 lead malfunction/device ERI s/p new lead & generator change (Medtronic) 04/2011.   Late effect of cerebrovascular accident (CVA) 09/01/2018   Old MI (myocardial infarction) 04/14/2017    Past Surgical History:  Procedure Laterality Date   APPENDECTOMY  ~ [place] WITH STENT PLACEMENT  2005   ICD GENERATOR CHANGEOUT N/A 09/09/2020   Procedure: ICD GENERATOR  CHANGEOUT;  Surgeon: Constance Haw, MD;  Location: [place] CV LAB;  Service: Cardiovascular;  Laterality: N/A;   ICD placement  2005   ICD replaced  04/30/11   ICD lead replaced; old ICD removed; new ICD placed   LEAD REVISION N/A 04/30/2011   Procedure: LEAD REVISION;  Surgeon: Evans Lance, MD;  Location: [facility] CATH LAB;  Service: Cardiovascular;  Laterality: N/A;   NASAL SEPTUM SURGERY  1988    Current Medications: Current Meds  Medication Sig   albuterol (VENTOLIN HFA) 108 (90 Base) MCG/ACT inhaler Inhale 2 puffs into the lungs every 4 (four) hours as needed for wheezing or shortness of breath.    atorvastatin (LIPITOR) 40 MG tablet TAKE 1 TABLET BY MOUTH EVERY MORNING (Patient taking differently: Take 40 mg by mouth daily.)   carvedilol (COREG) 6.25 MG tablet Take 1 tablet (6.25 mg total) by mouth 2 (two) times daily.   clopidogrel (PLAVIX) 75 MG tablet Take 1 tablet (75 mg total) by mouth daily.   Coenzyme Q10 (COQ10) 100 MG CAPS Take 100 mg by mouth daily.   ELIQUIS 5 MG TABS tablet Take 1 tablet (5 mg total) by mouth 2 (two) times daily.   famotidine (PEPCID) 40 MG tablet Take 40 mg by mouth as needed for heartburn or indigestion.   fluticasone-salmeterol (ADVAIR) 500-50 MCG/ACT AEPB Inhale 1 puff into the lungs in the morning and at bedtime.   furosemide (LASIX) 20 MG tablet Take 20 mg by mouth as needed for edema.   Magnesium 400 MG TABS Take 400 mg by mouth daily at 6 (six) AM.   Melatonin 3 MG TBDP Take 3 mg by mouth at bedtime as needed (sleep).   montelukast (SINGULAIR) 10 MG tablet Take 10 mg by mouth at bedtime.   nitroGLYCERIN (NITROSTAT) 0.4 MG SL tablet Place 1 tablet (0.4 mg total) under the tongue every 5 (five) minutes as needed for chest pain.   Omega-3 Fatty Acids (FISH OIL PO) Take 1 capsule by mouth daily. Unknown strenght   ondansetron (ZOFRAN) 4 MG tablet Take 4 mg by mouth every 8 (eight) hours as needed for nausea or vomiting.   ondansetron (ZOFRAN-ODT) 4 MG disintegrating tablet Take 4 mg by mouth every 8 (eight) hours as needed for nausea/vomiting.   potassium chloride SA (KLOR-CON M) 20 MEQ tablet Take 1 tablet (20 mEq total) by mouth daily.   pregabalin (LYRICA) 75 MG capsule Take 75 mg by mouth 2 (two) times daily.   Red Yeast Rice Extract (RED YEAST RICE PO) Take 1 capsule by mouth daily. Unknown strenght   sacubitril-valsartan (ENTRESTO) 24-26 MG Take 1 tablet by mouth 2 (two) times daily.     Allergies:   Spironolactone, Sulfasalazine, Sulfonamide derivatives, and Codeine   Social History   Socioeconomic History   Marital status: Married    Spouse  name: Not on file   Number of children: Not on file   Years of education: Not on file   Highest education level: Not on file  Occupational History   Not on file  Tobacco Use   Smoking status: Former    Packs/day: 0.25    Years: 30.00    Total pack years: 7.50    Types: Cigarettes    Quit date: 09/25/2019    Years since quitting: 2.4   Smokeless tobacco: Never   Tobacco comments:    currently wearing a patch  Substance and Sexual Activity   Alcohol use: Yes    Comment: Rare  Drug use: No   Sexual activity: Yes  Other Topics Concern   Not on file  Social History Narrative   Not on file   Social Determinants of Health   Financial Resource Strain: Not on file  Food Insecurity: Not on file  Transportation Needs: Not on file  Physical Activity: Not on file  Stress: Not on file  Social Connections: Not on file     Family History: The patient's family history includes Heart attack in his father. ROS:   Please see the history of present illness.    All 14 point review of systems negative except as described per history of present illness  EKGs/Labs/Other Studies Reviewed:      Recent Labs: 05/14/2021: NT-Pro BNP 252 07/22/2021: ALT 24; B Natriuretic Peptide 51.1; Hemoglobin 12.4; Platelets 153 12/12/2021: BUN 18; Creatinine, Ser 1.29; Magnesium 1.8; Potassium 4.9; Sodium 129  Recent Lipid Panel    Component Value Date/Time   CHOL 112 11/12/2020 1542   TRIG 112 11/12/2020 1542   HDL 27 (L) 11/12/2020 1542   CHOLHDL 4.1 11/12/2020 1542   LDLCALC 64 11/12/2020 1542   LDLDIRECT 64 12/12/2021 1119    Physical Exam:    VS:  BP 120/80 (BP Location: Left Arm, Patient Position: Sitting)   Pulse 80   Ht '5\' 5"'$  (1.651 m)   Wt 134 lb 6.4 oz (61 kg)   SpO2 99%   BMI 22.37 kg/m     Wt Readings from Last 3 Encounters:  03/13/22 134 lb 6.4 oz (61 kg)  12/12/21 139 lb 9.6 oz (63.3 kg)  10/13/21 139 lb (63 kg)     GEN:  Well nourished, well developed in no acute  distress HEENT: Normal NECK: No JVD; No carotid bruits LYMPHATICS: No lymphadenopathy CARDIAC: RRR, no murmurs, no rubs, no gallops RESPIRATORY:  Clear to auscultation without rales, wheezing or rhonchi  ABDOMEN: Soft, non-tender, non-distended MUSCULOSKELETAL:  No edema; No deformity  SKIN: Warm and dry LOWER EXTREMITIES: no swelling NEUROLOGIC:  Alert and oriented x 3 PSYCHIATRIC:  Normal affect   ASSESSMENT:    1. Ischemic cardiomyopathy   2. Coronary artery disease involving native coronary artery of native heart without angina pectoris   3. Chronic systolic congestive heart failure ([place])   4. Dyslipidemia   5. ICD (implantable cardioverter-defibrillator) in place   6. Late effect of cerebrovascular accident (CVA)    PLAN:    In order of problems listed above:  Ischemic cardiomyopathy on guideline directed medical therapy we have difficultyKeeping up his medication because of blood pressure being slightly low.  For now we will continue. Strange episodes with some stiffening and some shakiness will make arrangements for interrogation of his device.  If we will find no answering the device interrogation for his symptomatology I asked him to see neurologist. Chronic congestive heart failure and his physical SEEMS to be compensated. Dyslipidemia I did review K PN which show me LDL of 64 HDL 27.  She is already on statin which I will continue improve only with her 40 mg daily   Medication Adjustments/Labs and Tests Ordered: Current medicines are reviewed at length with the patient today.  Concerns regarding medicines are outlined above.  No orders of the defined types were placed in this encounter.  Medication changes: No orders of the defined types were placed in this encounter.   Signed, Park Liter, MD, [facility] 03/13/2022 1:25 PM    [facility]

## 2022-03-13 NOTE — Telephone Encounter (Signed)
I called the patient to send a manual transmission. He was still at Dr. Raliegh Ip office. He agreed to send one when he get home.

## 2022-03-13 NOTE — Telephone Encounter (Signed)
Patient called in with help with monitor. We called tech support they are sending him a new one since his isnt working anymore. Patient aware it will take 3-5 days.

## 2022-03-13 NOTE — Telephone Encounter (Signed)
Dr. Agustin Cree requesting remote transmission.  Will await transmission.

## 2022-03-20 NOTE — Telephone Encounter (Signed)
Pt states he did receive his new monitor. He will send a transmission today.

## 2022-04-03 NOTE — Progress Notes (Signed)
Remote ICD transmission.   

## 2022-04-27 ENCOUNTER — Encounter: Payer: Self-pay | Admitting: Cardiology

## 2022-05-26 ENCOUNTER — Other Ambulatory Visit: Payer: Self-pay | Admitting: Cardiology

## 2022-06-09 ENCOUNTER — Ambulatory Visit: Payer: Self-pay

## 2022-06-22 ENCOUNTER — Ambulatory Visit: Payer: No Typology Code available for payment source | Attending: Cardiology | Admitting: Cardiology

## 2022-06-22 ENCOUNTER — Encounter: Payer: Self-pay | Admitting: Cardiology

## 2022-06-22 VITALS — BP 126/82 | HR 79 | Ht 66.0 in | Wt 142.4 lb

## 2022-06-22 DIAGNOSIS — I5022 Chronic systolic (congestive) heart failure: Secondary | ICD-10-CM

## 2022-06-22 DIAGNOSIS — I10 Essential (primary) hypertension: Secondary | ICD-10-CM

## 2022-06-22 DIAGNOSIS — R0609 Other forms of dyspnea: Secondary | ICD-10-CM

## 2022-06-22 DIAGNOSIS — E785 Hyperlipidemia, unspecified: Secondary | ICD-10-CM

## 2022-06-22 DIAGNOSIS — Z95810 Presence of automatic (implantable) cardiac defibrillator: Secondary | ICD-10-CM

## 2022-06-22 DIAGNOSIS — I255 Ischemic cardiomyopathy: Secondary | ICD-10-CM

## 2022-06-22 DIAGNOSIS — I2510 Atherosclerotic heart disease of native coronary artery without angina pectoris: Secondary | ICD-10-CM

## 2022-06-22 NOTE — Patient Instructions (Signed)
Medication Instructions:  Your physician recommends that you continue on your current medications as directed. Please refer to the Current Medication list given to you today.  *If you need a refill on your cardiac medications before your next appointment, please call your pharmacy*   Lab Work: BMP, ProBNP, Lipid, - today If you have labs (blood work) drawn today and your tests are completely normal, you will receive your results only by: MyChart Message (if you have MyChart) OR A paper copy in the mail If you have any lab test that is abnormal or we need to change your treatment, we will call you to review the results.   Testing/Procedures: None Ordered   Follow-Up: At [facility], you and your health needs are our priority.  As part of our continuing mission to provide you with exceptional heart care, we have created designated Provider Care Teams.  These Care Teams include your primary Cardiologist (physician) and Advanced Practice Providers (APPs -  Physician Assistants and Nurse Practitioners) who all work together to provide you with the care you need, when you need it.  We recommend signing up for the patient portal called "MyChart".  Sign up information is provided on this After Visit Summary.  MyChart is used to connect with patients for Virtual Visits (Telemedicine).  Patients are able to view lab/test results, encounter notes, upcoming appointments, etc.  Non-urgent messages can be sent to your provider as well.   To learn more about what you can do with MyChart, go to [email or web address].    Your next appointment:   6 month(s)  The format for your next appointment:   In Person  Provider:   Gypsy Balsam, MD    Other Instructions NA

## 2022-06-22 NOTE — Progress Notes (Unsigned)
Cardiology Office Note:    Date:  06/22/2022   ID:  [name], DOB 06-27-1955, MRN [id number]  PCP:  Street, Stephanie Coup, MD  Cardiologist:  Gypsy Balsam, MD    Referring MD: Street, Stephanie Coup, *   Chief Complaint  Patient presents with   Follow-up  Doing well  History of Present Illness:    [name] is a 67 y.o. male past medical history significant for coronary disease in 2005 he suffered from acute anterior wall myocardial infarction he had a popping V-fib arrest" defibrillation.  Stent to proximal LAD was placed.  He was left with ischemic cardiomyopathy ejection fraction 30 to 35%.  He does have ICD which is a Medtronic device.  Comes today to months for follow-up.  Overall doing well.  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  Denies have any chest pain tightness squeezing pressure burning chest no swelling of lower extremities no palpitations dizziness or discharges from his defibrillator.  Past Medical History:  Diagnosis Date   Basal cell epithelioma    "under my left eye"   CAD (coronary artery disease)    s/p anterior wall MI 2005 with VF arrest at tha ttime s/p stent of prox LAD, with repeat intervention at that time for ruptured plque in RCA which had cleared so significantly that PCI was no longer contemplated    CAD, NATIVE VESSEL 03/20/2008   Qualifier: Diagnosis of  By: Graciela Husbands, MD, Susie Cassette    Chronic systolic congestive heart failure (HCC) 04/14/2017   Coronary artery disease involving native coronary artery of native heart without angina pectoris 11/05/2014   s/p anterior wall MI 2005 with VF arrest at tha ttime s/p stent of prox LAD, with repeat intervention at that time for ruptured plque in RCA which had cleared so significantly that PCI was no longer contemplated  Formatting of this note might be different from the original. Status post anterior wall myocardial infarction stent to LAD in 2005    Dyslipidemia 04/14/2017   H/O heart artery stent 04/14/2017   High cholesterol    Hypertension    Implantable cardioverter-defibrillator (ICD) in situ 03/20/2008   Qualifier: Diagnosis of  By: Graciela Husbands, MD, Susie Cassette    Ischemic cardiomyopathy    EF 30-35% s/p ICD placement 2005. 4098 lead malfunction/device ERI s/p new lead & generator change (Medtronic) 04/2011.   Late effect of cerebrovascular accident (CVA) 09/01/2018   Old MI (myocardial infarction) 04/14/2017    Past Surgical History:  Procedure Laterality Date   APPENDECTOMY  ~ 1974   CORONARY ANGIOPLASTY WITH STENT PLACEMENT  2005   ICD GENERATOR CHANGEOUT N/A 09/09/2020   Procedure: ICD GENERATOR CHANGEOUT;  Surgeon: Regan Lemming, MD;  Location: [facility] INVASIVE CV LAB;  Service: Cardiovascular;  Laterality: N/A;   ICD placement  2005   ICD replaced  04/30/11   ICD lead replaced; old ICD removed; new ICD placed   LEAD REVISION N/A 04/30/2011   Procedure: LEAD REVISION;  Surgeon: Marinus Maw, MD;  Location: [facility] CATH LAB;  Service: Cardiovascular;  Laterality: N/A;   NASAL SEPTUM SURGERY  1988    Current Medications: Current Meds  Medication Sig   albuterol (VENTOLIN HFA) 108 (90 Base) MCG/ACT inhaler Inhale 2 puffs into the lungs every 4 (four) hours as needed for wheezing or shortness of breath.   atorvastatin (LIPITOR) 40 MG tablet TAKE 1 TABLET BY MOUTH EVERY MORNING (Patient taking differently:  Take 40 mg by mouth daily.)   carvedilol (COREG) 6.25 MG tablet Take 1 tablet (6.25 mg total) by mouth 2 (two) times daily.   clopidogrel (PLAVIX) 75 MG tablet TAKE 1 TABLET(75 MG) BY MOUTH DAILY (Patient taking differently: Take 75 mg by mouth daily.)   Coenzyme Q10 (COQ10) 100 MG CAPS Take 100 mg by mouth daily.   famotidine (PEPCID) 40 MG tablet Take 40 mg by mouth as needed for heartburn or indigestion.   fluticasone-salmeterol (ADVAIR) 500-50 MCG/ACT AEPB Inhale 1 puff into the lungs in the morning and at bedtime.    furosemide (LASIX) 20 MG tablet Take 1 tablet (20 mg total) by mouth as needed for edema.   HYDROXYZINE HCL PO Take 25 mg by mouth 2 (two) times a week.   Magnesium 400 MG TABS Take 400 mg by mouth daily at 6 (six) AM.   Melatonin 3 MG TBDP Take 3 mg by mouth at bedtime as needed (sleep).   montelukast (SINGULAIR) 10 MG tablet Take 10 mg by mouth at bedtime.   nitroGLYCERIN (NITROSTAT) 0.4 MG SL tablet Place 1 tablet (0.4 mg total) under the tongue every 5 (five) minutes as needed for chest pain.   Omega-3 Fatty Acids (FISH OIL PO) Take 1 capsule by mouth daily. Unknown strenght   ondansetron (ZOFRAN) 4 MG tablet Take 4 mg by mouth every 8 (eight) hours as needed for nausea or vomiting.   ondansetron (ZOFRAN-ODT) 4 MG disintegrating tablet Take 4 mg by mouth every 8 (eight) hours as needed for nausea/vomiting.   potassium chloride SA (KLOR-CON M) 20 MEQ tablet Take 1 tablet (20 mEq total) by mouth daily.   pregabalin (LYRICA) 75 MG capsule Take 75 mg by mouth 2 (two) times daily.   Red Yeast Rice Extract (RED YEAST RICE PO) Take 1 capsule by mouth daily. Unknown strenght   sacubitril-valsartan (ENTRESTO) 24-26 MG Take 1 tablet by mouth 2 (two) times daily.     Allergies:   Spironolactone, Sulfasalazine, Sulfonamide derivatives, and Codeine   Social History   Socioeconomic History   Marital status: Married    Spouse name: Not on file   Number of children: Not on file   Years of education: Not on file   Highest education level: Not on file  Occupational History   Not on file  Tobacco Use   Smoking status: Former    Packs/day: 0.25    Years: 30.00    Additional pack years: 0.00    Total pack years: 7.50    Types: Cigarettes    Quit date: 09/25/2019    Years since quitting: 2.7   Smokeless tobacco: Never   Tobacco comments:    currently wearing a patch  Substance and Sexual Activity   Alcohol use: Yes    Comment: Rare   Drug use: No   Sexual activity: Yes  Other Topics Concern    Not on file  Social History Narrative   Not on file   Social Determinants of Health   Financial Resource Strain: Not on file  Food Insecurity: Not on file  Transportation Needs: Not on file  Physical Activity: Not on file  Stress: Not on file  Social Connections: Not on file     Family History: The patient's family history includes Heart attack in his father. ROS:   Please see the history of present illness.    All 14 point review of systems negative except as described per history of present illness  EKGs/Labs/Other Studies Reviewed:  Recent Labs: 07/22/2021: ALT 24; B Natriuretic Peptide 51.1; Hemoglobin 12.4; Platelets 153 12/12/2021: BUN 18; Creatinine, Ser 1.29; Magnesium 1.8; Potassium 4.9; Sodium 129  Recent Lipid Panel    Component Value Date/Time   CHOL 112 11/12/2020 1542   TRIG 112 11/12/2020 1542   HDL 27 (L) 11/12/2020 1542   CHOLHDL 4.1 11/12/2020 1542   LDLCALC 64 11/12/2020 1542   LDLDIRECT 64 12/12/2021 1119    Physical Exam:    VS:  BP 126/82 (BP Location: Left Arm, Patient Position: Sitting)   Pulse 79   Ht 5\' 6"  (1.676 m)   Wt 142 lb 6.4 oz (64.6 kg)   SpO2 99%   BMI 22.98 kg/m     Wt Readings from Last 3 Encounters:  06/22/22 142 lb 6.4 oz (64.6 kg)  03/13/22 134 lb 6.4 oz (61 kg)  12/12/21 139 lb 9.6 oz (63.3 kg)     GEN:  Well nourished, well developed in no acute distress HEENT: Normal NECK: No JVD; No carotid bruits LYMPHATICS: No lymphadenopathy CARDIAC: RRR, no murmurs, no rubs, no gallops RESPIRATORY:  Clear to auscultation without rales, wheezing or rhonchi  ABDOMEN: Soft, non-tender, non-distended MUSCULOSKELETAL:  No edema; No deformity  SKIN: Warm and dry LOWER EXTREMITIES: no swelling NEUROLOGIC:  Alert and oriented x 3 PSYCHIATRIC:  Normal affect   ASSESSMENT:    1. Chronic systolic congestive heart failure (HCC)   2. Coronary artery disease involving native coronary artery of native heart without angina  pectoris   3. Primary hypertension   4. Ischemic cardiomyopathy   5. ICD (implantable cardioverter-defibrillator) in place   6. Dyslipidemia    PLAN:    In order of problems listed above:  Ischemic cardiomyopathy with significantly reduced left ventricle ejection fraction will check Chem-7 to see if we can augment his medical therapy.  Likely on physical exam he had only minimal swelling of lower extremities. Essential hypertension blood pressure well-controlled continue present management. Dyslipidemia will check his fasting lipid profile I did review K PN which show me his LDL of 64 HDL 27 however this is from 2022. ICD present: There is a Medtronic device.  Will talk to our EP team to ask them if they can investigate and interrogate device today since he missed his last appointment   Medication Adjustments/Labs and Tests Ordered: Current medicines are reviewed at length with the patient today.  Concerns regarding medicines are outlined above.  No orders of the defined types were placed in this encounter.  Medication changes: No orders of the defined types were placed in this encounter.   Signed, Georgeanna Lea, MD, [facility] 06/22/2022 8:41 AM    [facility]

## 2022-06-23 LAB — BASIC METABOLIC PANEL
BUN/Creatinine Ratio: 16 (ref 10–24)
BUN: 21 mg/dL (ref 8–27)
CO2: 18 mmol/L — ABNORMAL LOW (ref 20–29)
Calcium: 9.1 mg/dL (ref 8.6–10.2)
Chloride: 104 mmol/L (ref 96–106)
Creatinine, Ser: 1.29 mg/dL — ABNORMAL HIGH (ref 0.76–1.27)
Glucose: 84 mg/dL (ref 70–99)
Potassium: 5.1 mmol/L (ref 3.5–5.2)
Sodium: 138 mmol/L (ref 134–144)
eGFR: 62 mL/min/{1.73_m2} (ref >59)

## 2022-06-23 LAB — LIPID PANEL
Chol/HDL Ratio: 4.2 ratio (ref 0.0–5.0)
Cholesterol, Total: 126 mg/dL (ref 100–199)
HDL: 30 mg/dL — ABNORMAL LOW (ref >39)
LDL Chol Calc (NIH): 76 mg/dL (ref 0–99)
Triglycerides: 105 mg/dL (ref 0–149)
VLDL Cholesterol Cal: 20 mg/dL (ref 5–40)

## 2022-06-23 LAB — PRO B NATRIURETIC PEPTIDE: NT-Pro BNP: 139 pg/mL (ref 0–210)

## 2022-06-26 DIAGNOSIS — E785 Hyperlipidemia, unspecified: Secondary | ICD-10-CM

## 2022-06-29 MED ORDER — EZETIMIBE 10 MG PO TABS: 10.0000 mg | ORAL_TABLET | Freq: Every day | ORAL | 2 refills | Status: DC

## 2022-07-10 ENCOUNTER — Ambulatory Visit (INDEPENDENT_AMBULATORY_CARE_PROVIDER_SITE_OTHER): Payer: No Typology Code available for payment source

## 2022-07-10 DIAGNOSIS — I255 Ischemic cardiomyopathy: Secondary | ICD-10-CM | POA: Diagnosis not present

## 2022-07-10 LAB — CUP PACEART REMOTE DEVICE CHECK
Battery Remaining Longevity: 124 mo
Battery Voltage: 3.03 V
Brady Statistic RV Percent Paced: 0 %
Date Time Interrogation Session: 20240517104410
HighPow Impedance: 55 Ohm
Implantable Lead Connection Status: 753985
Implantable Lead Implant Date: 20130307
Implantable Lead Location: 753860
Implantable Lead Model: 6935
Implantable Pulse Generator Implant Date: 20220718
Lead Channel Impedance Value: 247 Ohm
Lead Channel Impedance Value: 342 Ohm
Lead Channel Pacing Threshold Amplitude: 0.75 V
Lead Channel Pacing Threshold Pulse Width: 0.4 ms
Lead Channel Sensing Intrinsic Amplitude: 8.5 mV
Lead Channel Sensing Intrinsic Amplitude: 8.5 mV
Lead Channel Setting Pacing Amplitude: 2 V
Lead Channel Setting Pacing Pulse Width: 0.4 ms
Lead Channel Setting Sensing Sensitivity: 0.3 mV
Zone Setting Status: 755011
Zone Setting Status: 755011

## 2022-07-22 NOTE — Progress Notes (Signed)
Remote ICD transmission.   

## 2022-07-24 ENCOUNTER — Telehealth: Payer: Self-pay

## 2022-07-24 NOTE — Telephone Encounter (Signed)
-----   Message from Wiliam Ke, RN sent at 06/22/2022  9:30 AM EDT ----- Regarding: remote monitoring This Pt got a new monitor back in January.  He says he has tried to send a remote since then but is having trouble.  I don't think he wants to call Medtronic on his own.  Not urgent, because I checked him today, but could you call him sometime to trouble shoot?  TY! Boneta Lucks

## 2022-07-24 NOTE — Telephone Encounter (Signed)
Monitor updated 07/10/2022.

## 2022-08-24 ENCOUNTER — Other Ambulatory Visit: Payer: Self-pay | Admitting: Cardiology

## 2022-09-08 ENCOUNTER — Ambulatory Visit: Payer: Self-pay

## 2022-09-14 ENCOUNTER — Telehealth: Payer: Self-pay

## 2022-09-14 ENCOUNTER — Other Ambulatory Visit (HOSPITAL_COMMUNITY): Payer: Self-pay

## 2022-09-14 NOTE — Telephone Encounter (Signed)
Pharmacy Patient Advocate Encounter   Received notification from Physician's Office /RN-FREEMAN that prior authorization for ENTRESTO 24-26MG  is required/requested.   Insurance verification completed.   The patient is insured through [employer] [facility] .   Per test claim: PA started via CoverMyMeds. KEY [id number]   STATUS IS PENDING

## 2022-09-14 NOTE — Telephone Encounter (Signed)
Pt needs PA for Entresto.  Key [id number] Last name: [name] DOB: [birth year]

## 2022-09-15 ENCOUNTER — Other Ambulatory Visit (HOSPITAL_COMMUNITY): Payer: Self-pay

## 2022-09-16 NOTE — Telephone Encounter (Signed)
Pharmacy Patient Advocate Encounter  Received notification from [employer] [facility] that Prior Authorization for ENTRESTO has been APPROVED from 7.22.24 to 7.22.27.   See approval letter in patients media

## 2022-09-17 ENCOUNTER — Telehealth: Payer: Self-pay

## 2022-09-17 ENCOUNTER — Ambulatory Visit: Payer: 59 | Attending: Cardiovascular Disease

## 2022-09-17 DIAGNOSIS — I255 Ischemic cardiomyopathy: Secondary | ICD-10-CM

## 2022-09-17 NOTE — Telephone Encounter (Signed)
Pt called with issues regarding home monitoring.

## 2022-09-17 NOTE — Telephone Encounter (Signed)
Pt called in stating his app is not working. Pt was not able at the time of the call to call tech support so I gave pt the number and he will call back to let us know

## 2022-09-17 NOTE — Progress Notes (Signed)
Pt called DC with questions regarding home monitoring, auditory alerts, and technical assistance. Upon further assessment pt c/o SOB and fatigue x 3 weeks. Brought into clilnic to check HF diagnostics and device episodes/function since remote transmission not an option. Device function WNL. OpitVol within range. No new swelling. Productive cough. Sinus congestion. With these symptoms in addition to normal defib check pt advised to contact PCP for further eval.

## 2022-09-17 NOTE — Telephone Encounter (Signed)
Patient brought in today to device clinic to interrogate as unable to send manual and was complaining of not feeling well in past few weeks.   Also, recently had his ICD beep at him, likely due to a magnet being inadvertently placed near the device.   Patient appt today at 11am.   See full details in EPIC encounter.

## 2022-09-22 ENCOUNTER — Telehealth: Payer: Self-pay

## 2022-09-22 NOTE — Telephone Encounter (Signed)
He called to let me know his new monitor came.

## 2022-09-26 ENCOUNTER — Other Ambulatory Visit: Payer: Self-pay | Admitting: Cardiology

## 2022-09-30 ENCOUNTER — Telehealth: Payer: Self-pay | Admitting: Cardiology

## 2022-09-30 NOTE — Telephone Encounter (Signed)
   Name: [name]  DOB: 04-04-1955  MRN: [id number]  Primary Cardiologist: None   Preoperative team, please contact this patient and set up a phone call appointment for further preoperative risk assessment. Please obtain consent and complete medication review. Thank you for your help.  I confirm that guidance regarding antiplatelet and oral anticoagulation therapy has been completed and, if necessary, noted below.  Per Pharmacy: Of note there is a dx under problems of late effects of CVA. However, it appears from a note in 2020 by Dr. Bing Matter that workup was negative. I cannot find the documents from [facility].    Per office protocol, patient can hold Eliquis for 3 days prior to procedure.    Device clinic to make recommendations I.e. ICD in situ.    Joni Reining, NP 09/30/2022, 11:53 AM [facility]

## 2022-09-30 NOTE — Telephone Encounter (Signed)
   Pre-operative Risk Assessment    Patient Name: [name]  DOB: 02-20-1956 MRN: [id number]      Request for Surgical Clearance    Procedure:   CT Myelogram  Date of Surgery:  Clearance TBD                                 Surgeon: Maceo Pro Surgeon's Group or Practice Name:  [facility] Phone number:  [phone] Fax number:  [phone]   Type of Clearance Requested:   - Pharmacy:  Hold Clopidogrel (Plavix)     Type of Anesthesia:  None    Additional requests/questions:  Please advise surgeon/provider what medications should be held.  Signed, Belisicia T Woods   09/30/2022, 8:43 AM

## 2022-09-30 NOTE — Telephone Encounter (Signed)
Patient with diagnosis of afib on Eliquis for anticoagulation.    Procedure: CT myelogram Date of procedure: TBD   CHA2DS2-VASc Score = 4   This indicates a 4.8% annual risk of stroke. The patient's score is based upon: CHF History: 1 HTN History: 1 Diabetes History: 0 Stroke History: 0 Vascular Disease History: 1 Age Score: 1 Gender Score: 0      CrCl 52 ml/min  Of note there is a dx under problems of late effects of CVA. However, it appears from a note in 2020 by Dr. Bing Matter that workup was negative. I cannot find the documents from [facility].   Per office protocol, patient can hold Eliquis for 3 days prior to procedure.    **This guidance is not considered finalized until pre-operative APP has relayed final recommendations.**

## 2022-09-30 NOTE — Telephone Encounter (Signed)
Thank you for clarifying.  Will await pharmacy recommendations.

## 2022-09-30 NOTE — Telephone Encounter (Signed)
Spoke with requesting office and she states that clearance is needed for both Eliquis and Plavix

## 2022-09-30 NOTE — Telephone Encounter (Signed)
1st attempt to reach pt. Lvm

## 2022-09-30 NOTE — Telephone Encounter (Signed)
Please advise concerning ICD in situ for CT Myelogram to Dr. Bethann [facility].  [phone]

## 2022-09-30 NOTE — Telephone Encounter (Signed)
Pharmacy please advise on holding Eliquis prior to CT myelogram scheduled for TBD/  On review of the request, they did not mention the Eliquis, only clopidogrel. I will have pre-op call back call the surgeon to request clarification.   Thank you.

## 2022-09-30 NOTE — Telephone Encounter (Signed)
Patient is on Eliquis as well as Plavix. The pre-op request only listed Plavix.  Please confirm that they want pharmacy recommendations on Eliquis as well. Thank you!

## 2022-10-02 ENCOUNTER — Telehealth: Payer: Self-pay

## 2022-10-02 NOTE — Telephone Encounter (Signed)
  Patient Consent for Virtual Visit         [name] has provided verbal consent on 10/02/2022 for a virtual visit (video or telephone).   CONSENT FOR VIRTUAL VISIT FOR:  [name]  By participating in this virtual visit I agree to the following:  I hereby voluntarily request, consent and authorize [facility] and its employed or contracted physicians, physician assistants, nurse practitioners or other licensed health care professionals (the Practitioner), to provide me with telemedicine health care services (the "Services") as deemed necessary by the treating Practitioner. I acknowledge and consent to receive the Services by the Practitioner via telemedicine. I understand that the telemedicine visit will involve communicating with the Practitioner through live audiovisual communication technology and the disclosure of certain medical information by electronic transmission. I acknowledge that I have been given the opportunity to request an in-person assessment or other available alternative prior to the telemedicine visit and am voluntarily participating in the telemedicine visit.  I understand that I have the right to withhold or withdraw my consent to the use of telemedicine in the course of my care at any time, without affecting my right to future care or treatment, and that the Practitioner or I may terminate the telemedicine visit at any time. I understand that I have the right to inspect all information obtained and/or recorded in the course of the telemedicine visit and may receive copies of available information for a reasonable fee.  I understand that some of the potential risks of receiving the Services via telemedicine include:  Delay or interruption in medical evaluation due to technological equipment failure or disruption; Information transmitted may not be sufficient (e.g. poor resolution of images) to allow for appropriate medical decision making by the  Practitioner; and/or  In rare instances, security protocols could fail, causing a breach of personal health information.  Furthermore, I acknowledge that it is my responsibility to provide information about my medical history, conditions and care that is complete and accurate to the best of my ability. I acknowledge that Practitioner's advice, recommendations, and/or decision may be based on factors not within their control, such as incomplete or inaccurate data provided by me or distortions of diagnostic images or specimens that may result from electronic transmissions. I understand that the practice of medicine is not an exact science and that Practitioner makes no warranties or guarantees regarding treatment outcomes. I acknowledge that a copy of this consent can be made available to me via my patient portal [facility] MyChart), or I can request a printed copy by calling the office of [facility].    I understand that my insurance will be billed for this visit.   I have read or had this consent read to me. I understand the contents of this consent, which adequately explains the benefits and risks of the Services being provided via telemedicine.  I have been provided ample opportunity to ask questions regarding this consent and the Services and have had my questions answered to my satisfaction. I give my informed consent for the services to be provided through the use of telemedicine in my medical care

## 2022-10-02 NOTE — Telephone Encounter (Signed)
Pt schedule for tele visit on 10/14/22. Med rec and consent done

## 2022-10-09 ENCOUNTER — Ambulatory Visit (INDEPENDENT_AMBULATORY_CARE_PROVIDER_SITE_OTHER): Payer: No Typology Code available for payment source

## 2022-10-09 DIAGNOSIS — I255 Ischemic cardiomyopathy: Secondary | ICD-10-CM

## 2022-10-09 LAB — CUP PACEART REMOTE DEVICE CHECK
Battery Remaining Longevity: 122 mo
Battery Voltage: 3.02 V
Brady Statistic RV Percent Paced: 0.01 %
Date Time Interrogation Session: 20240816093322
HighPow Impedance: 59 Ohm
Implantable Lead Connection Status: 753985
Implantable Lead Implant Date: 20130307
Implantable Lead Location: 753860
Implantable Lead Model: 6935
Implantable Pulse Generator Implant Date: 20220718
Lead Channel Impedance Value: 247 Ohm
Lead Channel Impedance Value: 361 Ohm
Lead Channel Pacing Threshold Amplitude: 0.875 V
Lead Channel Pacing Threshold Pulse Width: 0.4 ms
Lead Channel Sensing Intrinsic Amplitude: 8.25 mV
Lead Channel Sensing Intrinsic Amplitude: 8.25 mV
Lead Channel Setting Pacing Amplitude: 2 V
Lead Channel Setting Pacing Pulse Width: 0.4 ms
Lead Channel Setting Sensing Sensitivity: 0.3 mV
Zone Setting Status: 755011
Zone Setting Status: 755011

## 2022-10-14 ENCOUNTER — Ambulatory Visit: Payer: No Typology Code available for payment source | Attending: Internal Medicine

## 2022-10-14 DIAGNOSIS — Z01810 Encounter for preprocedural cardiovascular examination: Secondary | ICD-10-CM

## 2022-10-14 NOTE — Progress Notes (Addendum)
Virtual Visit via Telephone Note   Because of [name]'s co-morbid illnesses, he is at least at moderate risk for complications without adequate follow up.  This format is felt to be most appropriate for this patient at this time.  The patient did not have access to video technology/had technical difficulties with video requiring transitioning to audio format only (telephone).  All issues noted in this document were discussed and addressed.  No physical exam could be performed with this format.  Please refer to the patient's chart for his consent to telehealth for [facility].  Evaluation Performed:  Preoperative cardiovascular risk assessment _____________   Date:  10/14/2022   Patient ID:  [name], DOB 1955-04-30, MRN [id number] Patient Location:  Home Provider location:   Office  Primary Care Provider:  Street, Stephanie Coup, MD Primary Cardiologist:  None  Chief Complaint / Patient Profile   67 y.o. y/o male with a h/o CAD s/p anterior MI 2005 stent to proximal LAD VT s/p ICD, ICM, HFrEF, HTN, HLD who is pending CT myelogram and presents today for telephonic preoperative cardiovascular risk assessment.  History of Present Illness    [name] is a 67 y.o. male who presents via audio/video conferencing for a telehealth visit today.  Pt was last seen in cardiology clinic on 06/22/2022 by Dr. Bing Matter.  At that time [name] was doing well with no new cardiac complaints.  The patient is now pending procedure as outlined above. Since his last visit, he has been doing well with no new cardiac complaints.   He denies chest pain, shortness of breath, lower extremity edema, fatigue, palpitations, melena, hematuria, hemoptysis, diaphoresis, weakness, presyncope, syncope, orthopnea, and PND.   Past Medical History    Past Medical History:  Diagnosis Date   Basal cell epithelioma    "under my left eye"   CAD (coronary artery disease)     s/p anterior wall MI 2005 with VF arrest at tha ttime s/p stent of prox LAD, with repeat intervention at that time for ruptured plque in RCA which had cleared so significantly that PCI was no longer contemplated    CAD, NATIVE VESSEL 03/20/2008   Qualifier: Diagnosis of  By: Graciela Husbands, MD, Susie Cassette    Chronic systolic congestive heart failure (HCC) 04/14/2017   Coronary artery disease involving native coronary artery of native heart without angina pectoris 11/05/2014   s/p anterior wall MI 2005 with VF arrest at tha ttime s/p stent of prox LAD, with repeat intervention at that time for ruptured plque in RCA which had cleared so significantly that PCI was no longer contemplated  Formatting of this note might be different from the original. Status post anterior wall myocardial infarction stent to LAD in 2005   Dyslipidemia 04/14/2017   H/O heart artery stent 04/14/2017   High cholesterol    Hypertension    Implantable cardioverter-defibrillator (ICD) in situ 03/20/2008   Qualifier: Diagnosis of  By: Graciela Husbands, MD, Susie Cassette    Ischemic cardiomyopathy    EF 30-35% s/p ICD placement 2005. 6962 lead malfunction/device ERI s/p new lead & generator change ([employer]) 04/2011.   Late effect of cerebrovascular accident (CVA) 09/01/2018   Old MI (myocardial infarction) 04/14/2017   Past Surgical History:  Procedure Laterality Date   APPENDECTOMY  ~ 1974   CORONARY ANGIOPLASTY WITH STENT PLACEMENT  2005   ICD GENERATOR CHANGEOUT N/A 09/09/2020   Procedure: ICD GENERATOR CHANGEOUT;  Surgeon: Elberta Fortis, Will  Daphine Deutscher, MD;  Location: [facility] INVASIVE CV LAB;  Service: Cardiovascular;  Laterality: N/A;   ICD placement  2005   ICD replaced  04/30/11   ICD lead replaced; old ICD removed; new ICD placed   LEAD REVISION N/A 04/30/2011   Procedure: LEAD REVISION;  Surgeon: Marinus Maw, MD;  Location: [facility] CATH LAB;  Service: Cardiovascular;  Laterality: N/A;   NASAL SEPTUM SURGERY  1988    Allergies  Allergies   Allergen Reactions   Spironolactone     Unknown reaction   Sulfasalazine     REACTION: Pt prefers not to take   Sulfonamide Derivatives     REACTION: Pt prefers not to take   Codeine Nausea And Vomiting    Mainly vomiting    Home Medications    Prior to Admission medications   Medication Sig Start Date End Date Taking? Authorizing Provider  albuterol (VENTOLIN HFA) 108 (90 Base) MCG/ACT inhaler Inhale 2 puffs into the lungs every 4 (four) hours as needed for wheezing or shortness of breath.    [provider]  atorvastatin (LIPITOR) 40 MG tablet TAKE 1 TABLET BY MOUTH EVERY MORNING 08/24/22   Georgeanna Lea, MD  carvedilol (COREG) 6.25 MG tablet Take 1 tablet (6.25 mg total) by mouth 2 (two) times daily. 12/12/21   Georgeanna Lea, MD  clopidogrel (PLAVIX) 75 MG tablet TAKE 1 TABLET(75 MG) BY MOUTH DAILY Patient taking differently: Take 75 mg by mouth daily. 05/26/22   Georgeanna Lea, MD  Coenzyme Q10 (COQ10) 100 MG CAPS Take 100 mg by mouth daily.    [provider]  ELIQUIS 5 MG TABS tablet Take 1 tablet (5 mg total) by mouth 2 (two) times daily. 12/12/21   Georgeanna Lea, MD  ENTRESTO 24-26 MG TAKE 1 TABLET BY MOUTH TWICE DAILY 08/24/22   Georgeanna Lea, MD  ezetimibe (ZETIA) 10 MG tablet Take 1 tablet (10 mg total) by mouth daily. 09/28/22   Georgeanna Lea, MD  famotidine (PEPCID) 40 MG tablet Take 40 mg by mouth as needed for heartburn or indigestion.    [provider]  fluticasone-salmeterol (ADVAIR) 500-50 MCG/ACT AEPB Inhale 1 puff into the lungs in the morning and at bedtime.    [provider]  furosemide (LASIX) 20 MG tablet Take 1 tablet (20 mg total) by mouth as needed for edema. 03/13/22   Georgeanna Lea, MD  HYDROXYZINE HCL PO Take 25 mg by mouth 2 (two) times a week.    [provider]  Magnesium 400 MG TABS Take 400 mg by mouth daily at 6 (six) AM.    [provider]  Melatonin 3 MG TBDP  Take 3 mg by mouth at bedtime as needed (sleep).    [provider]  montelukast (SINGULAIR) 10 MG tablet Take 10 mg by mouth at bedtime.    [provider]  nitroGLYCERIN (NITROSTAT) 0.4 MG SL tablet Place 1 tablet (0.4 mg total) under the tongue every 5 (five) minutes as needed for chest pain. 10/13/21   Georgeanna Lea, MD  Omega-3 Fatty Acids (FISH OIL PO) Take 1 capsule by mouth daily. Unknown strenght    [provider]  ondansetron (ZOFRAN) 4 MG tablet Take 4 mg by mouth every 8 (eight) hours as needed for nausea or vomiting.    [provider]  ondansetron (ZOFRAN-ODT) 4 MG disintegrating tablet Take 4 mg by mouth every 8 (eight) hours as needed for nausea/vomiting. 08/02/20  [provider]  potassium chloride SA (KLOR-CON M) 20 MEQ tablet Take 1 tablet (20 mEq total) by mouth daily. 03/14/21   Camnitz, Andree Coss, MD  pregabalin (LYRICA) 75 MG capsule Take 75 mg by mouth 2 (two) times daily. 08/16/21   [provider]  Red Yeast Rice Extract (RED YEAST RICE PO) Take 1 capsule by mouth daily. Unknown strenght    [provider]    Physical Exam    Vital Signs:  [name] does not have vital signs available for review today.  Given telephonic nature of communication, physical exam is limited. AAOx3. NAD. Normal affect.  Speech and respirations are unlabored.  Accessory Clinical Findings    None  Assessment & Plan    1.  Preoperative Cardiovascular Risk Assessment:  -Patient's RCRI score is 6.6%  The patient affirms he has been doing well without any new cardiac symptoms. They are able to achieve 7 METS without cardiac limitations. Therefore, based on ACC/AHA guidelines, the patient would be at acceptable risk for the planned procedure without further cardiovascular testing. The patient was advised that if he develops new symptoms prior to surgery to contact our office to arrange for a follow-up visit, and he  verbalized understanding.   The patient was advised that if he develops new symptoms prior to surgery to contact our office to arrange for a follow-up visit, and he verbalized understanding.  Patient was advised to hold Eliquis 3 days prior to procedure and per protocol Plavix can be held  5 days prior to procedure and should restart both medications postprocedure when surgically safe and hemostasis is achieved  A copy of this note will be routed to requesting surgeon.  Time:   Today, I have spent 8 minutes with the patient with telehealth technology discussing medical history, symptoms, and management plan.     Napoleon Form, Leodis Rains, NP  10/14/2022, 7:37 AM

## 2022-10-19 NOTE — Progress Notes (Signed)
Remote ICD transmission.   

## 2022-11-18 ENCOUNTER — Telehealth: Payer: Self-pay

## 2022-11-18 NOTE — Telephone Encounter (Signed)
Name: [name]  DOB: May 06, 1955  MRN: [id number]  Primary Cardiologist: Gypsy Balsam, MD, EP - Dr. Loman Brooklyn  Chart reviewed as part of pre-operative protocol coverage. Because of [name]'s past medical history and time since last visit, he will require a follow-up in-office visit in order to better assess preoperative cardiovascular risk. Has significant history of CAD with prior anterior MI/cardiac arrest, ICM, chronic HFrEF with ICD, and also overdue for EP follow-up. At last OV 05/2022 was recommended for 6 month follow-up. Given medical history and need fo general anesthesia, seems most appropriate to proceed with scheduling 11/2022 in person OV with Dr. Bing Matter for formal pre-op eval rather than virtual visit.  Pre-op covering staff: - Please schedule appointment and call patient to inform them. - Please contact requesting surgeon's office via preferred method (i.e, phone, fax) to inform them of need for appointment prior to surgery.  Additional tasks: - This message will also be routed to pharmacy pool for input on holding Eliquis as requested below so that this information is available to the clearing provider at time of patient's appointment.  - Patient has history of prox LAD stent in setting of VF arrest as well as residual RCA disease therefore recommend clearance to hold Plavix come from Dr. Bing Matter at time of their visit; no historical MD clearance to reference at this time. -Per preop protocol, given >1 year since last EP Visit will also route to [facility] device pool to assist with scheduling EP follow-up.  Laurann Montana, PA-C  11/18/2022, 8:41 AM

## 2022-11-18 NOTE — Telephone Encounter (Signed)
Pre-operative Risk Assessment    Patient Name: [name]  DOB: 07-02-1955 MRN: [id number]      Request for Surgical Clearance    Procedure:   anterior cervical discectomy and interbody fusion  Date of Surgery:  Clearance TBD                                 Surgeon:  Dr. Garnette Gunner Surgeon's Group or Practice Name:  [facility] Phone number:  [phone] Fax number:  [phone]   Type of Clearance Requested:   - Medical  - Pharmacy:  Hold Clopidogrel (Plavix) and Apixaban (Eliquis) please advise   Type of Anesthesia:  General    Additional requests/questions:    Merlene Laughter   11/18/2022, 8:08 AM

## 2022-11-18 NOTE — Telephone Encounter (Signed)
If device clearance is requested, patient is overdue with Dr. Elberta Fortis. Message sent to EP scheduler for apt,.

## 2022-11-18 NOTE — Telephone Encounter (Signed)
Appreciate pharm input. See notes below regarding needing in office visit. Device clinic also sent to EP scheduler to arrange overdue EP f/u. Will remove from preop box APP box as finalized clearance will be determined at time of visit.

## 2022-11-18 NOTE — Telephone Encounter (Signed)
1st attempt at scheduling in-office appt. Lvmtrc

## 2022-11-18 NOTE — Telephone Encounter (Signed)
Patient with diagnosis of afib on Eliquis for anticoagulation.    Procedure:  anterior cervical discectomy and interbody fusion  Date of procedure: TBD   CHA2DS2-VASc Score = 4   This indicates a 4.8% annual risk of stroke. The patient's score is based upon: CHF History: 1 HTN History: 1 Diabetes History: 0 Stroke History: 0 Vascular Disease History: 1 Age Score: 1 Gender Score: 0      CrCl 52 ml/min  Of note there is a dx under problems of late effects of CVA. However, it appears from a note in 2020 by Dr. Bing Matter that workup was negative. I cannot find the documents from [facility].   Per office protocol, patient can hold Eliquis for 3 days prior to procedure.    **This guidance is not considered finalized until pre-operative APP has relayed final recommendations.**

## 2022-11-19 NOTE — Telephone Encounter (Signed)
Lvm to call back for appointment

## 2022-11-23 NOTE — Telephone Encounter (Signed)
I called the pt but he said he was trying to rest before having to go to work tonight. Pt asked if we could call back maybe earlier in the day. I assured the pt that I will send a message to scheduling team for Dr. Bing Matter to reach out to him to schedule an appt in the office. Pt said thank you.

## 2022-11-25 NOTE — Telephone Encounter (Signed)
Libby Maw, CMA; P Cv Div Ash/Hp Scheduling LVM for pt to call and schedule a pre op clearance appt with our NP/kbl 11/24/22

## 2022-11-26 NOTE — Telephone Encounter (Signed)
Patient scheduled with RJK on 10/22- offered 10/18 but he had to work/kbl 11/26/22  Thank you

## 2022-12-08 ENCOUNTER — Ambulatory Visit: Payer: Self-pay

## 2022-12-15 ENCOUNTER — Ambulatory Visit: Payer: 59 | Attending: Cardiology | Admitting: Cardiology

## 2022-12-15 VITALS — BP 90/50 | HR 78 | Ht 65.0 in | Wt 133.2 lb

## 2022-12-15 DIAGNOSIS — I10 Essential (primary) hypertension: Secondary | ICD-10-CM | POA: Diagnosis not present

## 2022-12-15 DIAGNOSIS — I255 Ischemic cardiomyopathy: Secondary | ICD-10-CM

## 2022-12-15 DIAGNOSIS — I2510 Atherosclerotic heart disease of native coronary artery without angina pectoris: Secondary | ICD-10-CM

## 2022-12-15 DIAGNOSIS — I5022 Chronic systolic (congestive) heart failure: Secondary | ICD-10-CM

## 2022-12-15 DIAGNOSIS — Z95810 Presence of automatic (implantable) cardiac defibrillator: Secondary | ICD-10-CM

## 2022-12-15 DIAGNOSIS — Z01810 Encounter for preprocedural cardiovascular examination: Secondary | ICD-10-CM

## 2022-12-15 DIAGNOSIS — I6930 Unspecified sequelae of cerebral infarction: Secondary | ICD-10-CM

## 2022-12-15 NOTE — Progress Notes (Signed)
Cardiology Office Note:    Date:  12/15/2022   ID:  [name], DOB [birth year], MRN [id number]  PCP:  Street, Stephanie Coup, MD  Cardiologist:  Gypsy Balsam, MD    Referring MD: Street, Stephanie Coup, *   Chief Complaint  Patient presents with   Follow-up    History of Present Illness:    [name] is a 67 y.o. male with complex past medical history.  That include coronary artery disease.  In 2005 he did suffer from acute anterior myocardial infarction that led to ventricular fibrillation arrest.  Stent to proximal LAD he has being implanted.  He was left with ischemic cardiomyopathy ejection fraction 30 to 35%.  He does have ICD which is a Medtronic device. Comes today to months for follow-up.  He is thinking about having surgery on his neck.  He does have a lot of pain with tingling and he was told if he does have surgery he may be having permanent nerve damage.  Denies have any cardiac complaints meaning there is no chest pain tightness squeezing pressure burning chest but he complained of being weak tired and exhausted.  In the matter-of-fact recently he was in the round of hospital he was found to be anemic  Past Medical History:  Diagnosis Date   Basal cell epithelioma    "under my left eye"   CAD (coronary artery disease)    s/p anterior wall MI 2005 with VF arrest at tha ttime s/p stent of prox LAD, with repeat intervention at that time for ruptured plque in RCA which had cleared so significantly that PCI was no longer contemplated    CAD, NATIVE VESSEL 03/20/2008   Qualifier: Diagnosis of  By: Graciela Husbands, MD, Susie Cassette    Chronic systolic congestive heart failure (HCC) 04/14/2017   Coronary artery disease involving native coronary artery of native heart without angina pectoris 11/05/2014   s/p anterior wall MI 2005 with VF arrest at tha ttime s/p stent of prox LAD, with repeat intervention at that time for ruptured plque in RCA which had cleared so  significantly that PCI was no longer contemplated  Formatting of this note might be different from the original. Status post anterior wall myocardial infarction stent to LAD in 2005   Dyslipidemia 04/14/2017   H/O heart artery stent 04/14/2017   High cholesterol    Hypertension    Implantable cardioverter-defibrillator (ICD) in situ 03/20/2008   Qualifier: Diagnosis of  By: Graciela Husbands, MD, Susie Cassette    Ischemic cardiomyopathy    EF 30-35% s/p ICD placement 2005. 3086 lead malfunction/device ERI s/p new lead & generator change (Medtronic) 04/2011.   Late effect of cerebrovascular accident (CVA) 09/01/2018   Old MI (myocardial infarction) 04/14/2017    Past Surgical History:  Procedure Laterality Date   APPENDECTOMY  ~ 1974   CORONARY ANGIOPLASTY WITH STENT PLACEMENT  2005   ICD GENERATOR CHANGEOUT N/A 09/09/2020   Procedure: ICD GENERATOR CHANGEOUT;  Surgeon: Regan Lemming, MD;  Location: [facility] INVASIVE CV LAB;  Service: Cardiovascular;  Laterality: N/A;   ICD placement  2005   ICD replaced  04/30/11   ICD lead replaced; old ICD removed; new ICD placed   LEAD REVISION N/A 04/30/2011   Procedure: LEAD REVISION;  Surgeon: Marinus Maw, MD;  Location[facility] C[facility] LAB;  Service: Cardiovascular;  Laterality: N/A;   NASAL SEPTUM SURGERY  1988    Current Medications: Current Meds  Medication Sig   albuterol (VENTOLIN  HFA) 108 (90 Base) MCG/ACT inhaler Inhale 2 puffs into the lungs every 4 (four) hours as needed for wheezing or shortness of breath.   atorvastatin (LIPITOR) 40 MG tablet TAKE 1 TABLET BY MOUTH EVERY MORNING (Patient taking differently: Take 40 mg by mouth daily.)   carvedilol (COREG) 6.25 MG tablet Take 1 tablet (6.25 mg total) by mouth 2 (two) times daily.   clopidogrel (PLAVIX) 75 MG tablet TAKE 1 TABLET(75 MG) BY MOUTH DAILY (Patient taking differently: Take 75 mg by mouth daily.)   Coenzyme Q10 (COQ10) 100 MG CAPS Take 100 mg by mouth daily.   ELIQUIS 5 MG TABS tablet Take 1  tablet (5 mg total) by mouth 2 (two) times daily.   ENTRESTO 24-26 MG TAKE 1 TABLET BY MOUTH TWICE DAILY   ezetimibe (ZETIA) 10 MG tablet Take 1 tablet (10 mg total) by mouth daily.   famotidine (PEPCID) 40 MG tablet Take 40 mg by mouth as needed for heartburn or indigestion.   fluticasone-salmeterol (ADVAIR) 500-50 MCG/ACT AEPB Inhale 1 puff into the lungs in the morning and at bedtime.   furosemide (LASIX) 20 MG tablet Take 1 tablet (20 mg total) by mouth as needed for edema.   HYDROXYZINE HCL PO Take 25 mg by mouth 2 (two) times a week.   Magnesium 400 MG TABS Take 400 mg by mouth daily at 6 (six) AM.   Melatonin 3 MG TBDP Take 3 mg by mouth at bedtime as needed (sleep).   montelukast (SINGULAIR) 10 MG tablet Take 10 mg by mouth at bedtime.   nitroGLYCERIN (NITROSTAT) 0.4 MG SL tablet Place 1 tablet (0.4 mg total) under the tongue every 5 (five) minutes as needed for chest pain.   Omega-3 Fatty Acids (FISH OIL PO) Take 1 capsule by mouth daily. Unknown strenght   ondansetron (ZOFRAN) 4 MG tablet Take 4 mg by mouth every 8 (eight) hours as needed for nausea or vomiting.   ondansetron (ZOFRAN-ODT) 4 MG disintegrating tablet Take 4 mg by mouth every 8 (eight) hours as needed for nausea/vomiting.   potassium chloride SA (KLOR-CON M) 20 MEQ tablet Take 1 tablet (20 mEq total) by mouth daily.   pregabalin (LYRICA) 75 MG capsule Take 75 mg by mouth 2 (two) times daily.   Red Yeast Rice Extract (RED YEAST RICE PO) Take 1 capsule by mouth daily. Unknown strenght     Allergies:   Spironolactone, Sulfasalazine, Sulfonamide derivatives, and Codeine   Social History   Socioeconomic History   Marital status: Married    Spouse name: Not on file   Number of children: Not on file   Years of education: Not on file   Highest education level: Not on file  Occupational History   Not on file  Tobacco Use   Smoking status: Former    Current packs/day: 0.00    Average packs/day: 0.3 packs/day for 30.0  years (7.5 ttl pk-yrs)    Types: Cigarettes    Start date: 09/24/1989    Quit date: 09/25/2019    Years since quitting: 3.2   Smokeless tobacco: Never   Tobacco comments:    currently wearing a patch  Substance and Sexual Activity   Alcohol use: Yes    Comment: Rare   Drug use: No   Sexual activity: Yes  Other Topics Concern   Not on file  Social History Narrative   Not on file   Social Determinants of Health   Financial Resource Strain: Not on file  Food Insecurity:  Not on file  Transportation Needs: Not on file  Physical Activity: Not on file  Stress: Not on file  Social Connections: Not on file     Family History: The patient's family history includes Heart attack in his father. ROS:   Please see the history of present illness.    All 14 point review of systems negative except as described per history of present illness  EKGs/Labs/Other Studies Reviewed:    EKG Interpretation Date/Time:  Tuesday December 15 2022 13:52:21 EDT Ventricular Rate:  78 PR Interval:  138 QRS Duration:  78 QT Interval:  336 QTC Calculation: 383 R Axis:   -60  Text Interpretation: Normal sinus rhythm Pulmonary disease pattern Left anterior fascicular block Septal infarct (cited on or before 22-Jul-2021) Abnormal ECG When compared with ECG of 22-Jul-2021 22:47, QRS axis Shifted left Confirmed by Gypsy Balsam [phone]) on 12/15/2022 2:01:34 PM    Recent Labs: 06/22/2022: BUN 21; Creatinine, Ser 1.29; NT-Pro BNP 139; Potassium 5.1; Sodium 138  Recent Lipid Panel    Component Value Date/Time   CHOL 126 06/22/2022 0914   TRIG 105 06/22/2022 0914   HDL 30 (L) 06/22/2022 0914   CHOLHDL 4.2 06/22/2022 0914   LDLCALC 76 06/22/2022 0914   LDLDIRECT 64 12/12/2021 1119    Physical Exam:    VS:  BP (!) 90/50 (BP Location: Left Arm, Patient Position: Sitting)   Pulse 78   Ht 5\' 5"  (1.651 m)   Wt 133 lb 3.2 oz (60.4 kg)   SpO2 99%   BMI 22.17 kg/m     Wt Readings from Last 3 Encounters:   12/15/22 133 lb 3.2 oz (60.4 kg)  06/22/22 142 lb 6.4 oz (64.6 kg)  03/13/22 134 lb 6.4 oz (61 kg)     GEN:  Well nourished, well developed in no acute distress HEENT: Normal NECK: No JVD; No carotid bruits LYMPHATICS: No lymphadenopathy CARDIAC: RRR, no murmurs, no rubs, no gallops RESPIRATORY:  Clear to auscultation without rales, wheezing or rhonchi  ABDOMEN: Soft, non-tender, non-distended MUSCULOSKELETAL:  No edema; No deformity  SKIN: Warm and dry LOWER EXTREMITIES: no swelling NEUROLOGIC:  Alert and oriented x 3 PSYCHIATRIC:  Normal affect   ASSESSMENT:    1. Primary hypertension   2. Coronary artery disease involving native coronary artery of native heart without angina pectoris   3. Chronic systolic congestive heart failure (HCC)   4. Ischemic cardiomyopathy   5. ICD (implantable cardioverter-defibrillator) in place   6. Late effect of cerebrovascular accident (CVA)    PLAN:    In order of problems listed above:  Coronary artery disease stable from that point review.  We talked about potentially doing surgical intervention under general anesthesia.  I will schedule him to have Lexiscan make sure he does not have any inducible ischemia. History of cardiomyopathy, echocardiogram will be done in the meantime we will continue guideline directed medical therapy. ICD implant.  Present normal function. Late effect of CVA.  No new issues. History of anemia we will check his CBC today.  He looks pale to me   Medication Adjustments/Labs and Tests Ordered: Current medicines are reviewed at length with the patient today.  Concerns regarding medicines are outlined above.  Orders Placed This Encounter  Procedures   EKG 12-Lead   Medication changes: No orders of the defined types were placed in this encounter.   Signed, Georgeanna Lea, MD, [facility] 12/15/2022 2:13 PM    [facility]

## 2022-12-15 NOTE — Patient Instructions (Addendum)
Medication Instructions:  Your physician recommends that you continue on your current medications as directed. Please refer to the Current Medication list given to you today.  *If you need a refill on your cardiac medications before your next appointment, please call your pharmacy*   Lab Work: CBC, HgbA1C- today If you have labs (blood work) drawn today and your tests are completely normal, you will receive your results only by: MyChart Message (if you have MyChart) OR A paper copy in the mail If you have any lab test that is abnormal or we need to change your treatment, we will call you to review the results.   Testing/Procedures: Your physician has requested that you have an echocardiogram. Echocardiography is a painless test that uses sound waves to create images of your heart. It provides your doctor with information about the size and shape of your heart and how well your heart's chambers and valves are working. This procedure takes approximately one hour. There are no restrictions for this procedure. Please do NOT wear cologne, perfume, aftershave, or lotions (deodorant is allowed). Please arrive 15 minutes prior to your appointment time.  Your physician has requested that you have a lexiscan myoview. For further information please visit [email or web address] Please follow instruction sheet, as given.  The test will take approximately 3 to 4 hours to complete; you may bring reading material.  If someone comes with you to your appointment, they will need to remain in the main lobby due to limited space in the testing area.    How to prepare for your Myocardial Perfusion Test: Do not eat or drink 3 hours prior to your test, except you may have water. Do not consume products containing caffeine (regular or decaffeinated) 12 hours prior to your test. (ex: coffee, chocolate, sodas, tea). Do bring a list of your current medications with you.  If not listed below, you may take your medications  as normal. Do wear comfortable clothes (no dresses or overalls) and walking shoes, tennis shoes preferred (No heels or open toe shoes are allowed). Do NOT wear cologne, perfume, aftershave, or lotions (deodorant is allowed). If these instructions are not followed, your test will have to be rescheduled.      Follow-Up: At [facility], you and your health needs are our priority.  As part of our continuing mission to provide you with exceptional heart care, we have created designated Provider Care Teams.  These Care Teams include your primary Cardiologist (physician) and Advanced Practice Providers (APPs -  Physician Assistants and Nurse Practitioners) who all work together to provide you with the care you need, when you need it.  We recommend signing up for the patient portal called "MyChart".  Sign up information is provided on this After Visit Summary.  MyChart is used to connect with patients for Virtual Visits (Telemedicine).  Patients are able to view lab/test results, encounter notes, upcoming appointments, etc.  Non-urgent messages can be sent to your provider as well.   To learn more about what you can do with MyChart, go to [email or web address].    Your next appointment:   5 month(s)  The format for your next appointment:   In Person  Provider:   Gypsy Balsam, MD    Other Instructions NA

## 2022-12-15 NOTE — Addendum Note (Signed)
Addended by: Baldo Ash D on: 12/15/2022 02:27 PM   Modules accepted: Orders

## 2022-12-15 NOTE — Addendum Note (Signed)
Addended by: Baldo Ash D on: 12/15/2022 02:36 PM   Modules accepted: Orders

## 2022-12-16 LAB — CBC
Hematocrit: 24.4 % — ABNORMAL LOW (ref 37.5–51.0)
Hemoglobin: 7.3 g/dL — ABNORMAL LOW (ref 13.0–17.7)
MCH: 23.3 pg — ABNORMAL LOW (ref 26.6–33.0)
MCHC: 29.9 g/dL — ABNORMAL LOW (ref 31.5–35.7)
MCV: 78 fL — ABNORMAL LOW (ref 79–97)
Platelets: 308 10*3/uL (ref 150–450)
RBC: 3.13 x10E6/uL — ABNORMAL LOW (ref 4.14–5.80)
RDW: 14.5 % (ref 11.6–15.4)
WBC: 3.8 10*3/uL (ref 3.4–10.8)

## 2022-12-16 LAB — HEMOGLOBIN A1C
Est. average glucose Bld gHb Est-mCnc: 123 mg/dL
Hgb A1c MFr Bld: 5.9 % — ABNORMAL HIGH (ref 4.8–5.6)

## 2022-12-17 ENCOUNTER — Telehealth (HOSPITAL_COMMUNITY): Payer: Self-pay | Admitting: *Deleted

## 2022-12-17 NOTE — Telephone Encounter (Signed)
Per DPR left detailed instructions on cell vm for MPI study .

## 2022-12-18 ENCOUNTER — Telehealth: Payer: Self-pay

## 2022-12-18 NOTE — Telephone Encounter (Signed)
Spoke with pt per Dr. Vanetta Shawl note. Advised per Dr. Bing Matter, due to Hgb of 7.3 he should be evaluated in the ED. Pt verbalized understanding and had no further questions.

## 2022-12-22 NOTE — Addendum Note (Signed)
Addended by: Gypsy Balsam on: 12/22/2022 09:58 AM   Modules accepted: Orders

## 2022-12-24 ENCOUNTER — Ambulatory Visit: Payer: 59

## 2022-12-30 ENCOUNTER — Telehealth (HOSPITAL_COMMUNITY): Payer: Self-pay | Admitting: *Deleted

## 2022-12-30 NOTE — Telephone Encounter (Signed)
Patient given detailed instructions per Myocardial Perfusion Study Information Sheet for the test on 01/06/2023 at 8:00. Patient notified to arrive 15 minutes early and that it is imperative to arrive on time for appointment to keep from having the test rescheduled.  If you need to cancel or reschedule your appointment, please call the office within 24 hours of your appointment. . Patient verbalized understanding.[name]

## 2023-01-04 ENCOUNTER — Other Ambulatory Visit: Payer: Self-pay | Admitting: Cardiology

## 2023-01-05 NOTE — Telephone Encounter (Signed)
Prescription refill request for Eliquis received. Indication:ICD Last office visit:10/24 Scr:1.29  4/24 Age: 67 Weight:60.3  KG  PRESCRIPTION REFILLED

## 2023-01-06 ENCOUNTER — Ambulatory Visit: Payer: 59 | Attending: Cardiology

## 2023-01-06 ENCOUNTER — Other Ambulatory Visit: Payer: Self-pay | Admitting: Cardiology

## 2023-01-06 VITALS — Ht 65.0 in | Wt 133.0 lb

## 2023-01-06 DIAGNOSIS — I255 Ischemic cardiomyopathy: Secondary | ICD-10-CM

## 2023-01-06 DIAGNOSIS — E7800 Pure hypercholesterolemia, unspecified: Secondary | ICD-10-CM | POA: Diagnosis not present

## 2023-01-06 DIAGNOSIS — I252 Old myocardial infarction: Secondary | ICD-10-CM | POA: Diagnosis not present

## 2023-01-06 DIAGNOSIS — Z01810 Encounter for preprocedural cardiovascular examination: Secondary | ICD-10-CM | POA: Diagnosis not present

## 2023-01-06 DIAGNOSIS — I2510 Atherosclerotic heart disease of native coronary artery without angina pectoris: Secondary | ICD-10-CM | POA: Diagnosis not present

## 2023-01-06 LAB — MYOCARDIAL PERFUSION IMAGING
LV dias vol: 102 mL (ref 62–150)
LV sys vol: 55 mL
Nuc Stress EF: 46 %
Peak HR: 93 {beats}/min
Peak HR: 93 {beats}/min
Rest HR: 77 {beats}/min
Rest HR: 77 {beats}/min
Rest Nuclear Isotope Dose: 10.1 mCi
SDS: 1
SRS: 28
SSS: 29
Stress Nuclear Isotope Dose: 30.2 mCi
TID: 1

## 2023-01-06 MED ORDER — TECHNETIUM TC 99M TETROFOSMIN IV KIT
30.2000 | PACK | Freq: Once | INTRAVENOUS | Status: AC | PRN
  Administered 2023-01-06: 30.2 via INTRAVENOUS

## 2023-01-06 MED ORDER — REGADENOSON 0.4 MG/5ML IV SOLN
0.4000 mg | Freq: Once | INTRAVENOUS | Status: AC
  Administered 2023-01-06: 0.4 mg via INTRAVENOUS

## 2023-01-06 MED ORDER — TECHNETIUM TC 99M TETROFOSMIN IV KIT
10.1000 | PACK | Freq: Once | INTRAVENOUS | Status: AC | PRN
  Administered 2023-01-06: 10.1 via INTRAVENOUS

## 2023-01-08 ENCOUNTER — Telehealth: Payer: Self-pay

## 2023-01-08 ENCOUNTER — Ambulatory Visit (INDEPENDENT_AMBULATORY_CARE_PROVIDER_SITE_OTHER): Payer: 59

## 2023-01-08 DIAGNOSIS — I255 Ischemic cardiomyopathy: Secondary | ICD-10-CM | POA: Diagnosis not present

## 2023-01-08 NOTE — Telephone Encounter (Signed)
Patient notified through my chart.

## 2023-01-08 NOTE — Telephone Encounter (Signed)
-----   Message from Gypsy Balsam sent at 01/07/2023 10:58 AM EST ----- Stress test showed no evidence of ischemia just old MI

## 2023-01-09 LAB — CUP PACEART REMOTE DEVICE CHECK
Battery Remaining Longevity: 120 mo
Battery Voltage: 3 V
Brady Statistic RV Percent Paced: 0.01 %
Date Time Interrogation Session: 20241115102305
HighPow Impedance: 56 Ohm
Implantable Lead Connection Status: 753985
Implantable Lead Implant Date: 20130307
Implantable Lead Location: 753860
Implantable Lead Model: 6935
Implantable Pulse Generator Implant Date: 20220718
Lead Channel Impedance Value: 247 Ohm
Lead Channel Impedance Value: 342 Ohm
Lead Channel Pacing Threshold Amplitude: 0.75 V
Lead Channel Pacing Threshold Pulse Width: 0.4 ms
Lead Channel Sensing Intrinsic Amplitude: 7.5 mV
Lead Channel Sensing Intrinsic Amplitude: 7.5 mV
Lead Channel Setting Pacing Amplitude: 2 V
Lead Channel Setting Pacing Pulse Width: 0.4 ms
Lead Channel Setting Sensing Sensitivity: 0.3 mV
Zone Setting Status: 755011
Zone Setting Status: 755011

## 2023-01-20 NOTE — Progress Notes (Signed)
Remote ICD transmission.   

## 2023-01-25 ENCOUNTER — Other Ambulatory Visit: Payer: Self-pay | Admitting: Cardiology

## 2023-01-25 NOTE — Telephone Encounter (Signed)
RX sent

## 2023-01-26 ENCOUNTER — Ambulatory Visit: Payer: 59 | Attending: Cardiology

## 2023-01-26 DIAGNOSIS — I255 Ischemic cardiomyopathy: Secondary | ICD-10-CM | POA: Diagnosis not present

## 2023-01-26 LAB — ECHOCARDIOGRAM COMPLETE
Area-P 1/2: 3.2 cm2
S' Lateral: 3.4 cm

## 2023-01-26 MED ORDER — PERFLUTREN LIPID MICROSPHERE
1.0000 mL | INTRAVENOUS | Status: AC | PRN
  Administered 2023-01-26: 10 mL via INTRAVENOUS

## 2023-02-03 ENCOUNTER — Telehealth: Payer: Self-pay | Admitting: Cardiology

## 2023-02-03 NOTE — Telephone Encounter (Signed)
Caller Jasmine December) is following-up on patient's clearance.

## 2023-02-03 NOTE — Telephone Encounter (Signed)
I s/w Jasmine December at Dr. Mittie Bodo office in regard to clearance received 10/2022. Pt saw Dr. Bing Matter 12/15/22, with ov notes stating needing cardiac testing. Pt has stress test 01/06/23 and echo 01/26/23. I assured Jasmine December that I will run this past our preop team and Dr. Bing Matter if the pt has been cleared. Once we have clearance completed we will fax the notes over to their office.

## 2023-02-04 NOTE — Telephone Encounter (Signed)
   Patient Name: [name]  DOB: July 28, 1955 MRN: [id number]  Primary Cardiologist: Gypsy Balsam, MD  Chart reviewed as part of pre-operative protocol coverage. Given past medical history and time since last visit, based on ACC/AHA guidelines, [name] is at acceptable risk for the planned procedure without further cardiovascular testing.   He had NM stress test on 01/06/2023 that was without new areas of ischemia, Echocardiogram 01/26/2023.  The EF was improved to 40%-45%.   Per office protocol, patient can hold Eliquis for 3 days prior to procedure.    The patient was advised that if he develops new symptoms prior to surgery to contact our office to arrange for a follow-up visit, and he verbalized understanding.  I will route this recommendation to the requesting party via Epic fax function and remove from pre-op pool.  Please call with questions.  Joni Reining, NP 02/04/2023, 8:02 AM

## 2023-03-01 ENCOUNTER — Encounter: Payer: No Typology Code available for payment source | Admitting: Cardiology

## 2023-03-09 ENCOUNTER — Ambulatory Visit: Payer: Self-pay

## 2023-03-12 ENCOUNTER — Other Ambulatory Visit: Payer: Self-pay | Admitting: Cardiology

## 2023-03-15 NOTE — Telephone Encounter (Signed)
I called [name] back from Dr. Mittie Bodo office and left vm that our office did fax clearance notes on 02/04/23 8:02 am Joni Reining, DNP.   I will  be happy to re-fax the notes today to fax # on clearance request. Fax # [phone]

## 2023-03-15 NOTE — Telephone Encounter (Signed)
[name] from Dr. Mittie Bodo office is calling to get update on clearance. Requesting call back.

## 2023-04-09 ENCOUNTER — Ambulatory Visit (INDEPENDENT_AMBULATORY_CARE_PROVIDER_SITE_OTHER): Payer: Self-pay

## 2023-04-09 DIAGNOSIS — I255 Ischemic cardiomyopathy: Secondary | ICD-10-CM

## 2023-04-12 LAB — CUP PACEART REMOTE DEVICE CHECK
Battery Remaining Longevity: 118 mo
Battery Voltage: 3.01 V
Brady Statistic RV Percent Paced: 0 %
Date Time Interrogation Session: 20250214222310
HighPow Impedance: 60 Ohm
Implantable Lead Connection Status: 753985
Implantable Lead Implant Date: 20130307
Implantable Lead Location: 753860
Implantable Lead Model: 6935
Implantable Pulse Generator Implant Date: 20220718
Lead Channel Impedance Value: 247 Ohm
Lead Channel Impedance Value: 342 Ohm
Lead Channel Pacing Threshold Amplitude: 0.75 V
Lead Channel Pacing Threshold Pulse Width: 0.4 ms
Lead Channel Sensing Intrinsic Amplitude: 9 mV
Lead Channel Sensing Intrinsic Amplitude: 9 mV
Lead Channel Setting Pacing Amplitude: 2 V
Lead Channel Setting Pacing Pulse Width: 0.4 ms
Lead Channel Setting Sensing Sensitivity: 0.3 mV
Zone Setting Status: 755011
Zone Setting Status: 755011

## 2023-05-11 NOTE — Addendum Note (Signed)
 Addended by: Elease Etienne A on: 05/11/2023 10:27 AM   Modules accepted: Orders

## 2023-05-11 NOTE — Progress Notes (Signed)
 Remote ICD transmission.

## 2023-06-04 ENCOUNTER — Ambulatory Visit: Payer: 59 | Attending: Cardiology | Admitting: Cardiology

## 2023-06-04 ENCOUNTER — Encounter: Payer: Self-pay | Admitting: Cardiology

## 2023-06-04 VITALS — BP 108/70 | HR 69 | Ht 66.0 in | Wt 124.0 lb

## 2023-06-04 DIAGNOSIS — I5022 Chronic systolic (congestive) heart failure: Secondary | ICD-10-CM | POA: Diagnosis not present

## 2023-06-04 DIAGNOSIS — I2510 Atherosclerotic heart disease of native coronary artery without angina pectoris: Secondary | ICD-10-CM | POA: Diagnosis not present

## 2023-06-04 DIAGNOSIS — I255 Ischemic cardiomyopathy: Secondary | ICD-10-CM | POA: Diagnosis not present

## 2023-06-04 DIAGNOSIS — I480 Paroxysmal atrial fibrillation: Secondary | ICD-10-CM

## 2023-06-04 DIAGNOSIS — R0789 Other chest pain: Secondary | ICD-10-CM | POA: Diagnosis not present

## 2023-06-04 MED ORDER — NITROGLYCERIN 0.4 MG SL SUBL: 0.4000 mg | SUBLINGUAL_TABLET | SUBLINGUAL | 1 refills | Status: DC | PRN

## 2023-06-04 NOTE — Patient Instructions (Signed)
 Medication Instructions:  Your physician has recommended you make the following change in your medication: Start Nitroglycerin 0.4mg . Take 1 tablet under the tongue as needed for chest pain. May take every 5 minutes  and no more than 3 doses. If chest pain not resolved please call 911.   *If you need a refill on your cardiac medications before your next appointment, please call your pharmacy*  Lab Work: None If you have labs (blood work) drawn today and your tests are completely normal, you will receive your results only by: MyChart Message (if you have MyChart) OR A paper copy in the mail If you have any lab test that is abnormal or we need to change your treatment, we will call you to review the results.  Testing/Procedures: None  Follow-Up: At [facility], you and your health needs are our priority.  As part of our continuing mission to provide you with exceptional heart care, our providers are all part of one team.  This team includes your primary Cardiologist (physician) and Advanced Practice Providers or APPs (Physician Assistants and Nurse Practitioners) who all work together to provide you with the care you need, when you need it.  Your next appointment:   1 month(s)  Provider:   Gypsy Balsam, MD    We recommend signing up for the patient portal called "MyChart".  Sign up information is provided on this After Visit Summary.  MyChart is used to connect with patients for Virtual Visits (Telemedicine).  Patients are able to view lab/test results, encounter notes, upcoming appointments, etc.  Non-urgent messages can be sent to your provider as well.   To learn more about what you can do with MyChart, go to [email or web address].   Other Instructions

## 2023-06-04 NOTE — Progress Notes (Signed)
 Cardiology Office Note:    Date:  06/04/2023   ID:  [name], DOB 10/04/1955, MRN [id number]  PCP:  Street, [name] Coup, MD  Cardiologist:  [name] Balsam, MD    Referring MD: Street, [name], *   Chief Complaint  Patient presents with   chest discomfrot    History of Present Illness:    [name] is a 68 y.o. male  with complex past medical history. That include coronary artery disease. In 2005 he did suffer from acute anterior myocardial infarction that led to ventricular fibrillation arrest. Stent to proximal LAD he has being implanted. He was left with ischemic cardiomyopathy ejection fraction 30 to 35%. He does have ICD which is a Medtronic device.  Comes today for follow-up overall doing well.  Described to have some strength sensation in the chest that he have difficulty describing what I can get out of him is the fact that they happen in different scenarios and does not he said sometimes when he walks.  Does not give him any dizziness or passing out.  Past Medical History:  Diagnosis Date   Basal cell epithelioma    "under my left eye"   CAD (coronary artery disease)    s/p anterior wall MI 2005 with VF arrest at tha ttime s/p stent of prox LAD, with repeat intervention at that time for ruptured plque in RCA which had cleared so significantly that PCI was no longer contemplated    CAD, NATIVE VESSEL 03/20/2008   Qualifier: Diagnosis of  By: Graciela Husbands, MD, Susie Cassette    Chronic systolic congestive heart failure (HCC) 04/14/2017   Coronary artery disease involving native coronary artery of native heart without angina pectoris 11/05/2014   s/p anterior wall MI 2005 with VF arrest at tha ttime s/p stent of prox LAD, with repeat intervention at that time for ruptured plque in RCA which had cleared so significantly that PCI was no longer contemplated  Formatting of this note might be different from the original. Status post anterior wall myocardial  infarction stent to LAD in 2005   Dyslipidemia 04/14/2017   H/O heart artery stent 04/14/2017   High cholesterol    Hypertension    Implantable cardioverter-defibrillator (ICD) in situ 03/20/2008   Qualifier: Diagnosis of  By: Graciela Husbands, MD, Susie Cassette    Ischemic cardiomyopathy    EF 30-35% s/p ICD placement 2005. 1914 lead malfunction/device ERI s/p new lead & generator change (Medtronic) 04/2011.   Late effect of cerebrovascular accident (CVA) 09/01/2018   Old MI (myocardial infarction) 04/14/2017    Past Surgical History:  Procedure Laterality Date   APPENDECTOMY  ~ 1974   CORONARY ANGIOPLASTY WITH STENT PLACEMENT  2005   ICD GENERATOR CHANGEOUT N/A 09/09/2020   Procedure: ICD GENERATOR CHANGEOUT;  Surgeon: Regan Lemming, MD;  Location: [facility] INVASIVE CV LAB;  Service: Cardiovascular;  Laterality: N/A;   ICD placement  2005   ICD replaced  04/30/11   ICD lead replaced; old ICD removed; new ICD placed   LEAD REVISION N/A 04/30/2011   Procedure: LEAD REVISION;  Surgeon: Marinus Maw, MD;  Location: [facility] CATH LAB;  Service: Cardiovascular;  Laterality: N/A;   NASAL SEPTUM SURGERY  1988    Current Medications: Current Meds  Medication Sig   albuterol (VENTOLIN HFA) 108 (90 Base) MCG/ACT inhaler Inhale 2 puffs into the lungs every 4 (four) hours as needed for wheezing or shortness of breath.   apixaban (ELIQUIS) 5 MG TABS  tablet TAKE 1 TABLET BY MOUTH TWICE A DAY   atorvastatin (LIPITOR) 40 MG tablet Take 1 tablet (40 mg total) by mouth daily.   carvedilol (COREG) 6.25 MG tablet TAKE 1 TABLET BY MOUTH TWICE A DAY   clopidogrel (PLAVIX) 75 MG tablet Take 1 tablet (75 mg total) by mouth daily.   Coenzyme Q10 (COQ10) 100 MG CAPS Take 100 mg by mouth daily.   ENTRESTO 24-26 MG TAKE 1 TABLET BY MOUTH TWICE DAILY   ezetimibe (ZETIA) 10 MG tablet TAKE 1 TABLET BY MOUTH EVERY DAY   famotidine (PEPCID) 40 MG tablet Take 40 mg by mouth as needed for heartburn or indigestion.    fluticasone-salmeterol (ADVAIR) 500-50 MCG/ACT AEPB Inhale 1 puff into the lungs in the morning and at bedtime.   furosemide (LASIX) 20 MG tablet Take 1 tablet (20 mg total) by mouth as needed for edema.   HYDROXYZINE HCL PO Take 25 mg by mouth 2 (two) times a week.   Magnesium 400 MG TABS Take 400 mg by mouth daily at 6 (six) AM.   Melatonin 3 MG TBDP Take 3 mg by mouth at bedtime as needed (sleep).   montelukast (SINGULAIR) 10 MG tablet Take 10 mg by mouth at bedtime.   nitroGLYCERIN (NITROSTAT) 0.4 MG SL tablet Place 1 tablet (0.4 mg total) under the tongue every 5 (five) minutes as needed for chest pain (Do not exceed 3 doses, if chest pain not resolved please call 911).   Omega-3 Fatty Acids (FISH OIL PO) Take 1 capsule by mouth daily. Unknown strenght   ondansetron (ZOFRAN) 4 MG tablet Take 4 mg by mouth every 8 (eight) hours as needed for nausea or vomiting.   ondansetron (ZOFRAN-ODT) 4 MG disintegrating tablet Take 4 mg by mouth every 8 (eight) hours as needed for nausea/vomiting.   potassium chloride SA (KLOR-CON M) 20 MEQ tablet Take 1 tablet (20 mEq total) by mouth daily.   pregabalin (LYRICA) 75 MG capsule Take 75 mg by mouth 2 (two) times daily.   Red Yeast Rice Extract (RED YEAST RICE PO) Take 1 capsule by mouth daily. Unknown strenght   [DISCONTINUED] nitroGLYCERIN (NITROSTAT) 0.4 MG SL tablet Place 1 tablet (0.4 mg total) under the tongue every 5 (five) minutes as needed for chest pain.     Allergies:   Spironolactone, Sulfasalazine, Sulfonamide derivatives, and Codeine   Social History   Socioeconomic History   Marital status: Married    Spouse name: Not on file   Number of children: Not on file   Years of education: Not on file   Highest education level: Not on file  Occupational History   Not on file  Tobacco Use   Smoking status: Former    Current packs/day: 0.00    Average packs/day: 0.3 packs/day for 30.0 years (7.5 ttl pk-yrs)    Types: Cigarettes    Start  date: 09/24/1989    Quit date: 09/25/2019    Years since quitting: 3.6   Smokeless tobacco: Never   Tobacco comments:    currently wearing a patch  Substance and Sexual Activity   Alcohol use: Yes    Comment: Rare   Drug use: No   Sexual activity: Yes  Other Topics Concern   Not on file  Social History Narrative   Not on file   Social Drivers of Health   Financial Resource Strain: Not on file  Food Insecurity: Not on file  Transportation Needs: Not on file  Physical Activity: Not on  file  Stress: Not on file  Social Connections: Not on file     Family History: The patient's family history includes Heart attack in his father. ROS:   Please see the history of present illness.    All 14 point review of systems negative except as described per history of present illness  EKGs/Labs/Other Studies Reviewed:    EKG Interpretation Date/Time:  Friday June 04 2023 16:21:02 EDT Ventricular Rate:  69 PR Interval:  146 QRS Duration:  76 QT Interval:  358 QTC Calculation: 383 R Axis:   262  Text Interpretation: Normal sinus rhythm Septal infarct (cited on or before 22-Jul-2021) Lateral infarct , age undetermined Abnormal ECG When compared with ECG of 15-Dec-2022 13:52, Nonspecific T wave abnormality, worse in Lateral leads Confirmed by [name] [phone]) on 06/04/2023 4:36:59 PM    Recent Labs: 06/22/2022: BUN 21; Creatinine, Ser 1.29; NT-Pro BNP 139; Potassium 5.1; Sodium 138 12/15/2022: Hemoglobin 7.3; Platelets 308  Recent Lipid Panel    Component Value Date/Time   CHOL 126 06/22/2022 0914   TRIG 105 06/22/2022 0914   HDL 30 (L) 06/22/2022 0914   CHOLHDL 4.2 06/22/2022 0914   LDLCALC 76 06/22/2022 0914   LDLDIRECT 64 12/12/2021 1119    Physical Exam:    VS:  BP 108/70 (BP Location: Right Arm, Patient Position: Sitting)   Pulse 69   Ht 5\' 6"  (1.676 m)   Wt 124 lb (56.2 kg)   SpO2 98%   BMI 20.01 kg/m     Wt Readings from Last 3 Encounters:  06/04/23 124 lb  (56.2 kg)  01/06/23 133 lb (60.3 kg)  12/24/22 133 lb (60.3 kg)     GEN:  Well nourished, well developed in no acute distress HEENT: Normal NECK: No JVD; No carotid bruits LYMPHATICS: No lymphadenopathy CARDIAC: RRR, no murmurs, no rubs, no gallops RESPIRATORY:  Clear to auscultation without rales, wheezing or rhonchi  ABDOMEN: Soft, non-tender, non-distended MUSCULOSKELETAL:  No edema; No deformity  SKIN: Warm and dry LOWER EXTREMITIES: no swelling NEUROLOGIC:  Alert and oriented x 3 PSYCHIATRIC:  Normal affect   ASSESSMENT:    1. Chest discomfort   2. Coronary artery disease involving native coronary artery of native heart without angina pectoris   3. Chronic systolic congestive heart failure (HCC)   4. Ischemic cardiomyopathy   5. Paroxysmal atrial fibrillation (HCC)    PLAN:    In order of problems listed above:  Coronary disease, recent stress test reviewed with the patient negative.  I give him nitroglycerin as needed asked him to try for the sensation if pain is not relieved by nitroglycerin he needs to go to the emergency room we will see him back in about a month to see how he does. Ischemic cardiomyopathy compensated doing well on appropriate guideline directed medical therapy. Dyslipidemia stable.   Medication Adjustments/Labs and Tests Ordered: Current medicines are reviewed at length with the patient today.  Concerns regarding medicines are outlined above.  Orders Placed This Encounter  Procedures   EKG 12-Lead   Medication changes:  Meds ordered this encounter  Medications   nitroGLYCERIN (NITROSTAT) 0.4 MG SL tablet    Sig: Place 1 tablet (0.4 mg total) under the tongue every 5 (five) minutes as needed for chest pain (Do not exceed 3 doses, if chest pain not resolved please call 911).    Dispense:  25 tablet    Refill:  1    Signed, Georgeanna Lea, MD, [facility] 06/04/2023 5:18 PM  [facility]

## 2023-06-08 ENCOUNTER — Ambulatory Visit: Payer: Self-pay

## 2023-06-14 ENCOUNTER — Encounter: Payer: Self-pay | Admitting: Cardiology

## 2023-06-14 ENCOUNTER — Ambulatory Visit: Payer: Self-pay | Attending: Cardiology | Admitting: Cardiology

## 2023-06-14 VITALS — BP 104/60 | HR 74 | Ht 65.5 in | Wt 123.8 lb

## 2023-06-14 DIAGNOSIS — I2510 Atherosclerotic heart disease of native coronary artery without angina pectoris: Secondary | ICD-10-CM

## 2023-06-14 DIAGNOSIS — I480 Paroxysmal atrial fibrillation: Secondary | ICD-10-CM | POA: Diagnosis not present

## 2023-06-14 DIAGNOSIS — D6869 Other thrombophilia: Secondary | ICD-10-CM | POA: Diagnosis not present

## 2023-06-14 DIAGNOSIS — I5022 Chronic systolic (congestive) heart failure: Secondary | ICD-10-CM | POA: Diagnosis not present

## 2023-06-14 LAB — CUP PACEART INCLINIC DEVICE CHECK
Date Time Interrogation Session: 20250421145957
Implantable Lead Connection Status: 753985
Implantable Lead Implant Date: 20130307
Implantable Lead Location: 753860
Implantable Lead Model: 6935
Implantable Pulse Generator Implant Date: 20220718

## 2023-06-14 NOTE — Progress Notes (Signed)
  Electrophysiology Office Note:   Date:  06/14/2023  ID:  [name], DOB [birth year], MRN [id number]  Primary Cardiologist: [name] Burger, MD Primary Heart Failure: None Electrophysiologist: [name] Kimmey Cortland Ding, MD      History of Present Illness:   [name] is a 68 y.o. male with h/o coronary artery disease, hypertension, hyperlipidemia, chronic systolic heart failure seen today for routine electrophysiology followup.   Since last being seen in our clinic the patient reports doing well.  He was seen by his primary cardiologist with chest discomfort and was given nitroglycerin .  He complains of intermittent short palpitations, but is otherwise without complaint.  The last time he had palpitations was approximately 3 weeks ago.  Review of device interrogation shows short episodes of atrial fibrillation, none that are more than a few minutes..  he denies chest pain, palpitations, dyspnea, PND, orthopnea, nausea, vomiting, dizziness, syncope, edema, weight gain, or early satiety.   Review of systems complete and found to be negative unless listed in HPI.      EP Information / Studies Reviewed:    EKG is not ordered today. EKG from 06/04/2023 reviewed which showed sinus rhythm, septal and lateral infarct      ICD Interrogation-  reviewed in detail today,  See PACEART report.  Device History: Medtronic Single Chamber ICD implanted  for chronic systolic heart failure History of appropriate therapy: No History of AAD therapy: No   Risk Assessment/Calculations:    CHA2DS2-VASc Score = 4   This indicates a 4.8% annual risk of stroke. The patient's score is based upon: CHF History: 1 HTN History: 1 Diabetes History: 0 Stroke History: 0 Vascular Disease History: 1 Age Score: 1 Gender Score: 0            Physical Exam:   VS:  BP 104/60   Pulse 74   Ht 5' 5.5" (1.664 m)   Wt 123 lb 12.8 oz (56.2 kg)   SpO2 98%   BMI 20.29 kg/m    Wt Readings from Last 3  Encounters:  06/14/23 123 lb 12.8 oz (56.2 kg)  06/04/23 124 lb (56.2 kg)  01/06/23 133 lb (60.3 kg)     GEN: Well nourished, well developed in no acute distress NECK: No JVD; No carotid bruits CARDIAC: Regular rate and rhythm, no murmurs, rubs, gallops RESPIRATORY:  Clear to auscultation without rales, wheezing or rhonchi  ABDOMEN: Soft, non-tender, non-distended EXTREMITIES:  No edema; No deformity   ASSESSMENT AND PLAN:    Chronic systolic dysfunction s/p Medtronic single chamber ICD  euvolemic today Stable on an appropriate medical regimen Normal ICD function Sensing, threshold, impedance within normal limits Programming reviewed and appropriate for patient See [name] report No changes today  2.  Paroxysmal atrial fibrillation: Minimal episodes noted on device interrogation.  3.  Secondary hypercoagulable state: Continue Eliquis  for atrial fibrillation  4.  Coronary artery disease with chronic stable angina: No current chest pain.  Plan per primary cardiology.  Disposition:   Follow up with Dr. Lawana Pray in 12 months   Signed, [name] Croy Cortland Ding, MD

## 2023-06-27 ENCOUNTER — Other Ambulatory Visit: Payer: Self-pay | Admitting: Cardiology

## 2023-06-28 NOTE — Telephone Encounter (Signed)
 Prescription refill request for Eliquis  received. Indication:afib Last office visit:4/25 Scr:1.29 2024 Age: 68 Weight:56.2  kg  Prescription refilled

## 2023-07-09 ENCOUNTER — Ambulatory Visit (INDEPENDENT_AMBULATORY_CARE_PROVIDER_SITE_OTHER): Payer: Self-pay

## 2023-07-09 DIAGNOSIS — I255 Ischemic cardiomyopathy: Secondary | ICD-10-CM

## 2023-07-09 LAB — CUP PACEART REMOTE DEVICE CHECK
Battery Remaining Longevity: 116 mo
Battery Voltage: 3.01 V
Brady Statistic RV Percent Paced: 0.01 %
Date Time Interrogation Session: 20250516053329
HighPow Impedance: 63 Ohm
Implantable Lead Connection Status: 753985
Implantable Lead Implant Date: 20130307
Implantable Lead Location: 753860
Implantable Lead Model: 6935
Implantable Pulse Generator Implant Date: 20220718
Lead Channel Impedance Value: 247 Ohm
Lead Channel Impedance Value: 342 Ohm
Lead Channel Pacing Threshold Amplitude: 0.875 V
Lead Channel Pacing Threshold Pulse Width: 0.4 ms
Lead Channel Sensing Intrinsic Amplitude: 9.25 mV
Lead Channel Sensing Intrinsic Amplitude: 9.25 mV
Lead Channel Setting Pacing Amplitude: 2 V
Lead Channel Setting Pacing Pulse Width: 0.4 ms
Lead Channel Setting Sensing Sensitivity: 0.3 mV
Zone Setting Status: 755011
Zone Setting Status: 755011

## 2023-07-11 ENCOUNTER — Ambulatory Visit: Payer: Self-pay | Admitting: Cardiology

## 2023-07-15 DIAGNOSIS — I2510 Atherosclerotic heart disease of native coronary artery without angina pectoris: Secondary | ICD-10-CM | POA: Insufficient documentation

## 2023-07-21 ENCOUNTER — Ambulatory Visit: Attending: Cardiology | Admitting: Cardiology

## 2023-07-21 ENCOUNTER — Encounter: Payer: Self-pay | Admitting: Cardiology

## 2023-07-21 VITALS — BP 100/58 | HR 80 | Ht 65.6 in | Wt 126.6 lb

## 2023-07-21 DIAGNOSIS — E785 Hyperlipidemia, unspecified: Secondary | ICD-10-CM

## 2023-07-21 DIAGNOSIS — I2510 Atherosclerotic heart disease of native coronary artery without angina pectoris: Secondary | ICD-10-CM | POA: Diagnosis not present

## 2023-07-21 DIAGNOSIS — I252 Old myocardial infarction: Secondary | ICD-10-CM

## 2023-07-21 DIAGNOSIS — Z95810 Presence of automatic (implantable) cardiac defibrillator: Secondary | ICD-10-CM

## 2023-07-21 DIAGNOSIS — I255 Ischemic cardiomyopathy: Secondary | ICD-10-CM | POA: Diagnosis not present

## 2023-07-21 NOTE — Patient Instructions (Signed)
 Medication Instructions:  Your physician recommends that you continue on your current medications as directed. Please refer to the Current Medication list given to you today.  *If you need a refill on your cardiac medications before your next appointment, please call your pharmacy*  Lab Work: None If you have labs (blood work) drawn today and your tests are completely normal, you will receive your results only by: MyChart Message (if you have MyChart) OR A paper copy in the mail If you have any lab test that is abnormal or we need to change your treatment, we will call you to review the results.  Testing/Procedures: None  Follow-Up: At [facility], you and your health needs are our priority.  As part of our continuing mission to provide you with exceptional heart care, our providers are all part of one team.  This team includes your primary Cardiologist (physician) and Advanced Practice Providers or APPs (Physician Assistants and Nurse Practitioners) who all work together to provide you with the care you need, when you need it.  Your next appointment:   6 month(s)  Provider:   Gypsy Balsam, MD  We recommend signing up for the patient portal called "MyChart".  Sign up information is provided on this After Visit Summary.  MyChart is used to connect with patients for Virtual Visits (Telemedicine).  Patients are able to view lab/test results, encounter notes, upcoming appointments, etc.  Non-urgent messages can be sent to your provider as well.   To learn more about what you can do with MyChart, go to [email or web address].   Other Instructions None

## 2023-07-21 NOTE — Progress Notes (Unsigned)
 Cardiology Office Note:    Date:  07/21/2023   ID:  Zolton, Dowson Oct 07, 1955, MRN [id number]  PCP:  Street, Renford Cartwright, MD  Cardiologist:  Ralene Burger, MD    Referring MD: [address] Renford Cartwright, *   No chief complaint on file.   History of Present Illness:    [name] is a 68 y.o. male  with complex past medical history. That include coronary artery disease. In 2005 he did suffer from acute anterior myocardial infarction that led to ventricular fibrillation arrest. Stent to proximal LAD he has being implanted. He was left with ischemic cardiomyopathy ejection fraction 30 to 35%. He does have ICD which is a Medtronic device.  Comes today to my for follow-up.  Overall he is doing well.  Since I seen him last time no more chest pain no more sensation he described last time I have seen him.  Denies have any chest pain tightness squeezing pressure burning chest.  Still works full-time but he is looking forward to his retirement which will be within the next few months.  Past Medical History:  Diagnosis Date   Basal cell epithelioma    "under my left eye"   CAD (coronary artery disease)    s/p anterior wall MI 2005 with VF arrest at tha ttime s/p stent of prox LAD, with repeat intervention at that time for ruptured plque in RCA which had cleared so significantly that PCI was no longer contemplated    CAD, NATIVE VESSEL 03/20/2008   Qualifier: Diagnosis of  By: Rodolfo Clan, MD, Lavella Poser    Chronic systolic congestive heart failure (HCC) 04/14/2017   Coronary artery disease involving native coronary artery of native heart without angina pectoris 11/05/2014   s/p anterior wall MI 2005 with VF arrest at tha ttime s/p stent of prox LAD, with repeat intervention at that time for ruptured plque in RCA which had cleared so significantly that PCI was no longer contemplated  Formatting of this note might be different from the original. Status post anterior wall  myocardial infarction stent to LAD in 2005   Dyslipidemia 04/14/2017   H/O heart artery stent 04/14/2017   High cholesterol    Hypertension    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   Qualifier: Diagnosis of   By: Rodolfo Clan, MD, Lavella Poser        Implantable cardioverter-defibrillator (ICD) in situ 03/20/2008   Qualifier: Diagnosis of  By: Rodolfo Clan, MD, Lavella Poser    Ischemic cardiomyopathy    EF 30-35% s/p ICD placement 2005. 6949 lead malfunction/device ERI s/p new lead & generator change (Medtronic) 04/2011.   Late effect of cerebrovascular accident (CVA) 09/01/2018   Old MI (myocardial infarction) 04/14/2017   Paroxysmal atrial fibrillation (HCC) 12/12/2021    Past Surgical History:  Procedure Laterality Date   APPENDECTOMY  ~ 1974   CORONARY ANGIOPLASTY WITH STENT PLACEMENT  2005   ICD GENERATOR CHANGEOUT N/A 09/09/2020   Procedure: ICD GENERATOR CHANGEOUT;  Surgeon: Lei Pump, MD;  Location: [facility] INVASIVE CV LAB;  Service: Cardiovascular;  Laterality: N/A;   ICD placement  2005   ICD replaced  04/30/11   ICD lead replaced; old ICD removed; new ICD placed   LEAD REVISION N/A 04/30/2011   Procedure: LEAD REVISION;  Surgeon: Tammie Fall, MD;  Location: [facility] C[facility] LAB;  Service: Cardiovascular;  Laterality: N/A;   NASAL SEPTUM SURGERY  1988    Current Medications: Current Meds  Medication Sig  albuterol  (VENTOLIN  HFA) 108 (90 Base) MCG/ACT inhaler Inhale 2 puffs into the lungs every 4 (four) hours as needed for wheezing or shortness of breath.   atorvastatin  (LIPITOR) 40 MG tablet Take 1 tablet (40 mg total) by mouth daily.   carvedilol  (COREG ) 6.25 MG tablet TAKE 1 TABLET BY MOUTH TWICE A DAY   clopidogrel  (PLAVIX ) 75 MG tablet Take 1 tablet (75 mg total) by mouth daily.   Coenzyme Q10 (COQ10) 100 MG CAPS Take 100 mg by mouth daily.   ELIQUIS  5 MG TABS tablet TAKE 1 TABLET BY MOUTH TWICE A DAY   ENTRESTO  24-26 MG TAKE 1 TABLET BY  MOUTH TWICE DAILY   ezetimibe  (ZETIA ) 10 MG tablet TAKE 1 TABLET BY MOUTH EVERY DAY   famotidine (PEPCID) 40 MG tablet Take 40 mg by mouth as needed for heartburn or indigestion.   furosemide  (LASIX ) 20 MG tablet Take 1 tablet (20 mg total) by mouth as needed for edema.   Magnesium 400 MG TABS Take 400 mg by mouth daily at 6 (six) AM.   Melatonin 3 MG TBDP Take 3 mg by mouth at bedtime as needed (sleep).   nitroGLYCERIN  (NITROSTAT ) 0.4 MG SL tablet Place 1 tablet (0.4 mg total) under the tongue every 5 (five) minutes as needed for chest pain (Do not exceed 3 doses, if chest pain not resolved please call 911).   Omega-3 Fatty Acids (FISH OIL PO) Take 1 capsule by mouth daily. Unknown strenght   ondansetron  (ZOFRAN ) 4 MG tablet Take 4 mg by mouth every 8 (eight) hours as needed for nausea or vomiting.   potassium chloride  SA (KLOR-CON  M) 20 MEQ tablet Take 1 tablet (20 mEq total) by mouth daily.   pregabalin (LYRICA) 75 MG capsule Take 75 mg by mouth 2 (two) times daily.   Red Yeast Rice Extract (RED YEAST RICE PO) Take 1 capsule by mouth daily. Unknown strenght     Allergies:   Spironolactone, Sulfasalazine, Sulfonamide derivatives, and Codeine   Social History   Socioeconomic History   Marital status: Married    Spouse name: Not on file   Number of children: Not on file   Years of education: Not on file   Highest education level: Not on file  Occupational History   Not on file  Tobacco Use   Smoking status: Former    Current packs/day: 0.00    Average packs/day: 0.3 packs/day for 30.0 years (7.5 ttl pk-yrs)    Types: Cigarettes    Start date: 09/24/1989    Quit date: 09/25/2019    Years since quitting: 3.8   Smokeless tobacco: Never   Tobacco comments:    currently wearing a patch  Substance and Sexual Activity   Alcohol use: Yes    Comment: Rare   Drug use: No   Sexual activity: Yes  Other Topics Concern   Not on file  Social History Narrative   Not on file   Social  Drivers of Health   Financial Resource Strain: Not on file  Food Insecurity: Not on file  Transportation Needs: Not on file  Physical Activity: Not on file  Stress: Not on file  Social Connections: Not on file     Family History: The patient's family history includes Heart attack in his father. ROS:   Please see the history of present illness.    All 14 point review of systems negative except as described per history of present illness  EKGs/Labs/Other Studies Reviewed:  Recent Labs: 12/15/2022: Hemoglobin 7.3; Platelets 308  Recent Lipid Panel    Component Value Date/Time   CHOL 126 06/22/2022 0914   TRIG 105 06/22/2022 0914   HDL 30 (L) 06/22/2022 0914   CHOLHDL 4.2 06/22/2022 0914   LDLCALC 76 06/22/2022 0914   LDLDIRECT 64 12/12/2021 1119    Physical Exam:    VS:  BP (!) 100/58   Pulse 80   Ht 5' 5.6" (1.666 m)   Wt 126 lb 9.6 oz (57.4 kg)   SpO2 94%   BMI 20.68 kg/m     Wt Readings from Last 3 Encounters:  07/21/23 126 lb 9.6 oz (57.4 kg)  06/14/23 123 lb 12.8 oz (56.2 kg)  06/04/23 124 lb (56.2 kg)     GEN:  Well nourished, well developed in no acute distress HEENT: Normal NECK: No JVD; No carotid bruits LYMPHATICS: No lymphadenopathy CARDIAC: RRR, no murmurs, no rubs, no gallops RESPIRATORY:  Clear to auscultation without rales, wheezing or rhonchi  ABDOMEN: Soft, non-tender, non-distended MUSCULOSKELETAL:  No edema; No deformity  SKIN: Warm and dry LOWER EXTREMITIES: no swelling NEUROLOGIC:  Alert and oriented x 3 PSYCHIATRIC:  Normal affect   ASSESSMENT:    1. Coronary artery disease involving native coronary artery of native heart without angina pectoris   2. Old MI (myocardial infarction)   3. Ischemic cardiomyopathy   4. Dyslipidemia   5. ICD (implantable cardioverter-defibrillator) in place    PLAN:    In order of problems listed above:  Coronary disease stable denies have any symptoms for now we will continue present  management. Ischemic cardiomyopathy on guideline directed medical therapy that he is able to tolerate we will continue present management. Dyslipidemia I did review his K PN data from last year show LDL 76 HDL 30 will repeat fasting lipid profile. ICD present last interrogation showed normal function.   Medication Adjustments/Labs and Tests Ordered: Current medicines are reviewed at length with the patient today.  Concerns regarding medicines are outlined above.  No orders of the defined types were placed in this encounter.  Medication changes: No orders of the defined types were placed in this encounter.   Signed, Manfred Seed, MD, [facility] 07/21/2023 2:47 PM    [facility]

## 2023-07-29 ENCOUNTER — Other Ambulatory Visit: Payer: Self-pay | Admitting: Cardiology

## 2023-08-12 NOTE — Progress Notes (Signed)
 Remote ICD transmission.

## 2023-08-20 ENCOUNTER — Other Ambulatory Visit: Payer: Self-pay | Admitting: Cardiology

## 2023-08-21 ENCOUNTER — Encounter (HOSPITAL_BASED_OUTPATIENT_CLINIC_OR_DEPARTMENT_OTHER): Payer: Self-pay | Admitting: Emergency Medicine

## 2023-08-21 ENCOUNTER — Ambulatory Visit (HOSPITAL_BASED_OUTPATIENT_CLINIC_OR_DEPARTMENT_OTHER)
Admission: EM | Admit: 2023-08-21 | Discharge: 2023-08-21 | Disposition: A | Discharge status: Home / Self Care | Attending: Family Medicine | Admitting: Family Medicine

## 2023-08-21 DIAGNOSIS — R42 Dizziness and giddiness: Secondary | ICD-10-CM

## 2023-08-21 DIAGNOSIS — R410 Disorientation, unspecified: Secondary | ICD-10-CM | POA: Diagnosis not present

## 2023-08-21 DIAGNOSIS — R7303 Prediabetes: Secondary | ICD-10-CM | POA: Diagnosis not present

## 2023-08-21 NOTE — Discharge Instructions (Addendum)
 Dizziness, confusion and prediabetes: Exam is essentially normal.  It sounds like the patient may be having episodes of hypoglycemia secondary to prediabetes.  Discussed the need for more regular meals, higher protein lower carbohydrate meals, and to try eating if he feels off.  Encouraged to keep well-hydrated.  Follow-up with primary care for further workup.  May need testing for diabetes or even a glucose tolerance test.  Follow-up here as needed.

## 2023-08-21 NOTE — ED Provider Notes (Signed)
 [facility]    CSN: [id number] Arrival date & time: 08/21/23  1304      History   Chief Complaint Chief Complaint  Patient presents with   Weakness   Fatigue    HPI [name] is a 68 y.o. male.   68 year old male here with his wife.  He works night shifts.  Has a significant history with chronic illnesses such as coronary artery disease, hypertension, hyperlipidemia.  He has had an MI and a stroke previously.  His wife reports that sometimes when he does not eat he becomes confused and irritable.  Then she will get him to eat and he clears up is alert and feels better.  He is a thin man who does not eat a lot.  He reports he drinks lots of fluids and tries to stay hydrated.  On Tuesday, 08/17/2023, he noticed that he was feeling dizzy, feeling sick and his wife noticed that he was not totally clear mentally.  That he was having some intermittent confusion.  He has been told that he has prediabetes by his family practice physician.  He continue to work this week but today he felt so bad he could not stay at work and he came home in the middle of the night.  He says 4-hour just not feeling well at all.  He denies fever, nausea, vomiting, constipation or diarrhea.   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   CAD (coronary artery disease)    s/p anterior wall MI 2005 with VF arrest at tha ttime s/p stent of prox LAD, with repeat intervention at that time for ruptured plque in RCA which had cleared so significantly that PCI was no longer contemplated    CAD, NATIVE VESSEL 03/20/2008   Qualifier: Diagnosis of  By: Fernande, MD, CODY Elspeth Darner    Chronic systolic congestive heart failure (HCC) 04/14/2017   Coronary artery disease involving native coronary  artery of native heart without angina pectoris 11/05/2014   s/p anterior wall MI 2005 with VF arrest at tha ttime s/p stent of prox LAD, with repeat intervention at that time for ruptured plque in RCA which had cleared so significantly that PCI was no longer contemplated  Formatting of this note might be different from the original. 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 6949 lead malfunction/device ERI s/p new lead & generator change ([employer]) 04/2011.   Late effect of cerebrovascular accident (CVA) 09/01/2018   Old MI (myocardial infarction) 04/14/2017   Paroxysmal atrial fibrillation (HCC) 12/12/2021    Patient Active Problem List   Diagnosis Date Noted   CAD (coronary artery disease)    Paroxysmal atrial fibrillation (HCC) 12/12/2021   Hypertension    High cholesterol    Basal cell epithelioma  Late effect of cerebrovascular accident (CVA) 09/01/2018   Chronic systolic congestive heart failure (HCC) 04/14/2017   Dyslipidemia 04/14/2017   H/O heart artery stent 04/14/2017   Old MI (myocardial infarction) 04/14/2017   Coronary artery disease involving native coronary artery of native heart without angina pectoris 11/05/2014   Ischemic cardiomyopathy    CAD, NATIVE VESSEL 03/20/2008   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   Implantable cardioverter-defibrillator (ICD) in situ 03/20/2008    Past Surgical History:  Procedure Laterality Date   APPENDECTOMY  ~ 1974   CORONARY ANGIOPLASTY WITH STENT  PLACEMENT  2005   ICD GENERATOR CHANGEOUT N/A 09/09/2020   Procedure: ICD GENERATOR CHANGEOUT;  Surgeon: Inocencio Soyla Lunger, MD;  Location: [facility] INVASIVE CV LAB;  Service: Cardiovascular;  Laterality: N/A;   ICD placement  2005   ICD replaced  04/30/11   ICD lead replaced; old ICD removed; new ICD placed   LEAD REVISION N/A 04/30/2011   Procedure: LEAD REVISION;  Surgeon: Danelle LELON Birmingham, MD;  Location: [facility] CATH LAB;  Service: Cardiovascular;  Laterality: N/A;   NASAL SEPTUM SURGERY  1988       Home Medications    Prior to Admission medications   Medication Sig Start Date End Date Taking? Authorizing Provider  atorvastatin  (LIPITOR) 40 MG tablet Take 1 tablet (40 mg total) by mouth daily. 03/15/23  Yes Krasowski, Robert J, MD  carvedilol  (COREG ) 6.25 MG tablet TAKE 1 TABLET BY MOUTH TWICE A DAY 01/25/23  Yes Krasowski, Robert J, MD  clopidogrel  (PLAVIX ) 75 MG tablet Take 1 tablet (75 mg total) by mouth daily. 01/25/23  Yes Krasowski, Robert J, MD  ELIQUIS  5 MG TABS tablet TAKE 1 TABLET BY MOUTH TWICE A DAY 06/28/23  Yes Krasowski, Robert J, MD  ezetimibe  (ZETIA ) 10 MG tablet TAKE 1 TABLET BY MOUTH EVERY DAY 01/25/23  Yes Krasowski, Robert J, MD  famotidine (PEPCID) 40 MG tablet Take 40 mg by mouth as needed for heartburn or indigestion.   Yes [provider]  nitroGLYCERIN  (NITROSTAT ) 0.4 MG SL tablet Place 1 tablet (0.4 mg total) under the tongue every 5 (five) minutes as needed for chest pain (Do not exceed 3 doses, if chest pain not resolved please call 911). 07/29/23 10/27/23 Yes Krasowski, Robert J, MD  ondansetron  (ZOFRAN ) 4 MG tablet Take 4 mg by mouth every 8 (eight) hours as needed for nausea or vomiting.   Yes [provider]  sacubitril -valsartan  (ENTRESTO ) 24-26 MG TAKE 1 TABLET BY MOUTH TWICE A DAY 08/20/23  Yes Krasowski, Robert J, MD  albuterol  (VENTOLIN  HFA) 108 (90 Base) MCG/ACT inhaler Inhale 2 puffs into the lungs every 4 (four) hours as needed for wheezing or shortness  of breath.    [provider]  Coenzyme Q10 (COQ10) 100 MG CAPS Take 100 mg by mouth daily.    [provider]  furosemide  (LASIX ) 20 MG tablet Take 1 tablet (20 mg total) by mouth as needed for edema. 03/13/22   Krasowski, Robert J, MD  Magnesium 400 MG TABS Take 400 mg by mouth daily at 6 (six) AM.    [provider]  Melatonin 3 MG TBDP Take 3 mg by mouth at bedtime as needed (sleep).    [provider]  Omega-3 Fatty Acids (FISH OIL PO) Take 1 capsule by mouth daily. Unknown strenght    [provider]  potassium chloride  SA (KLOR-CON  M) 20 MEQ tablet Take 1 tablet (20 mEq total) by mouth daily. 03/14/21  Camnitz, Will Gladis, MD  pregabalin (LYRICA) 75 MG capsule Take 75 mg by mouth 2 (two) times daily. 08/16/21   [provider]  Red Yeast Rice Extract (RED YEAST RICE PO) Take 1 capsule by mouth daily. Unknown strenght    [provider]    Family History Family History  Problem Relation Age of Onset   Heart attack Father     Social History Social History   Tobacco Use   Smoking status: Former    Current packs/day: 0.00    Average packs/day: 0.3 packs/day for 30.0 years (7.5 ttl pk-yrs)    Types: Cigarettes    Start date: 09/24/1989    Quit date: 09/25/2019    Years since quitting: 3.9   Smokeless tobacco: Never   Tobacco comments:    currently wearing a patch  Substance Use Topics   Alcohol use: Yes    Comment: Rare   Drug use: No     Allergies   Spironolactone, Sulfasalazine, Sulfonamide derivatives, and Codeine   Review of Systems Review of Systems  Constitutional:  Positive for fatigue. Negative for chills and fever.  HENT:  Negative for ear pain and sore throat.   Eyes:  Negative for pain and visual disturbance.  Respiratory:  Negative for cough.   Cardiovascular:  Negative for chest pain and palpitations.  Gastrointestinal:  Negative for abdominal pain, constipation, diarrhea, nausea and vomiting.   Genitourinary:  Negative for dysuria and hematuria.  Musculoskeletal:  Negative for arthralgias and back pain.  Skin:  Negative for color change and rash.  Neurological:  Positive for syncope, weakness and light-headedness. Negative for seizures.  Psychiatric/Behavioral:  Positive for confusion.   All other systems reviewed and are negative.    Physical Exam Triage Vital Signs ED Triage Vitals  Encounter Vitals Group     BP 08/21/23 1409 124/80     Girls Systolic BP Percentile --      Girls Diastolic BP Percentile --      Boys Systolic BP Percentile --      Boys Diastolic BP Percentile --      Pulse Rate 08/21/23 1409 74     Resp 08/21/23 1409 18     Temp 08/21/23 1409 97.7 F (36.5 C)     Temp Source 08/21/23 1409 Oral     SpO2 08/21/23 1409 99 %     Weight --      Height --      Head Circumference --      Peak Flow --      Pain Score 08/21/23 1407 4     Pain Loc --      Pain Education --      Exclude from Growth Chart --    Orthostatic VS for the past 24 hrs:  BP- Lying Pulse- Lying BP- Sitting Pulse- Sitting BP- Standing at 0 minutes Pulse- Standing at 0 minutes  08/21/23 1511 112/73 64 125/76 67 108/74 75    Updated Vital Signs BP 124/80 (BP Location: Right Arm)   Pulse 74   Temp 97.7 F (36.5 C) (Oral)   Resp 18   SpO2 99%   Visual Acuity Right Eye Distance:   Left Eye Distance:   Bilateral Distance:    Right Eye Near:   Left Eye Near:    Bilateral Near:     Physical Exam Vitals and nursing note reviewed.  Constitutional:      General: He is not in acute distress.    Appearance: He  is well-developed and underweight. He is not ill-appearing or toxic-appearing.  HENT:     Head: Normocephalic and atraumatic.     Right Ear: Hearing, tympanic membrane, ear canal and external ear normal.     Left Ear: Hearing, tympanic membrane, ear canal and external ear normal.     Nose: No congestion or rhinorrhea.     Right Sinus: No maxillary sinus tenderness or  frontal sinus tenderness.     Left Sinus: No maxillary sinus tenderness or frontal sinus tenderness.     Mouth/Throat:     Lips: Pink.     Mouth: Mucous membranes are moist.     Pharynx: Uvula midline. No oropharyngeal exudate or posterior oropharyngeal erythema.     Tonsils: No tonsillar exudate.   Eyes:     Conjunctiva/sclera: Conjunctivae normal.     Pupils: Pupils are equal, round, and reactive to light.    Cardiovascular:     Rate and Rhythm: Normal rate and regular rhythm.     Heart sounds: S1 normal and S2 normal. No murmur heard. Pulmonary:     Effort: Pulmonary effort is normal. No respiratory distress.     Breath sounds: Normal breath sounds. No decreased breath sounds, wheezing, rhonchi or rales.  Abdominal:     General: Bowel sounds are normal.     Palpations: Abdomen is soft.     Tenderness: There is no abdominal tenderness.   Musculoskeletal:        General: No swelling.     Cervical back: Neck supple.  Lymphadenopathy:     Head:     Right side of head: No submental, submandibular, tonsillar, preauricular or posterior auricular adenopathy.     Left side of head: No submental, submandibular, tonsillar, preauricular or posterior auricular adenopathy.     Cervical: No cervical adenopathy.     Right cervical: No superficial cervical adenopathy.    Left cervical: No superficial cervical adenopathy.   Skin:    General: Skin is warm and dry.     Capillary Refill: Capillary refill takes less than 2 seconds.     Findings: No rash.   Neurological:     Mental Status: He is alert and oriented to person, place, and time.     Cranial Nerves: Cranial nerves 2-12 are intact.     Sensory: Sensation is intact.     Motor: Motor function is intact.     Coordination: Coordination is intact.     Gait: Gait is intact.   Psychiatric:        Mood and Affect: Mood normal.        Behavior: Behavior normal. Behavior is cooperative.        Cognition and Memory: Cognition is not  impaired. Memory is not impaired. He does not exhibit impaired recent memory or impaired remote memory.      UC Treatments / Results  Labs (all labs ordered are listed, but only abnormal results are displayed) Labs Reviewed - No data to display  EKG   Radiology No results found.  Procedures Procedures (including critical care time)  Medications Ordered in UC Medications - No data to display  Initial Impression / Assessment and Plan / UC Course  I have reviewed the triage vital signs and the nursing notes.  Pertinent labs & imaging results that were available during my care of the patient were reviewed by me and considered in my medical decision making (see chart for details).  Plan of Care: Dizziness, fatigue and intermittent confusion:  Exam is normal and negative for neurologic changes.  Orthostatic pulse and pressures were normal/no orthostatic changes.  Patient has prediabetes and it sounds like he is having fluctuating low blood sugar events.  Encouraged to consider getting an over-the-counter blood glucose monitor and checking blood sugars when he does feel disoriented and confused or weak.  Encouraged more regular eating, eating every 3-4 hours, eating high-protein and low-carb.  Going to make an appointment with primary care and get worked up further.  Follow-up here as needed.  Lab work deferred.  He wants to come back fasting and let primary care do what ever lab work is needed.  I reviewed the plan of care with the patient and/or the patient's guardian.  The patient and/or guardian had time to ask questions and acknowledged that the questions were answered.  I provided instruction on symptoms or reasons to return here or to go to an ER, if symptoms/condition did not improve, worsened or if new symptoms occurred.  Final Clinical Impressions(s) / UC Diagnoses   Final diagnoses:  Dizziness  Confusion  Prediabetes     Discharge Instructions      Dizziness, confusion  and prediabetes: Exam is essentially normal.  It sounds like the patient may be having episodes of hypoglycemia secondary to prediabetes.  Discussed the need for more regular meals, higher protein lower carbohydrate meals, and to try eating if he feels off.  Encouraged to keep well-hydrated.  Follow-up with primary care for further workup.  May need testing for diabetes or even a glucose tolerance test.  Follow-up here as needed.     ED Prescriptions   None    PDMP not reviewed this encounter.   Ival Domino, FNP 08/21/23 1534

## 2023-08-21 NOTE — ED Triage Notes (Addendum)
 Pt c/o dizziness and weakness started Tuesday he tried to work last night but went home early due to not feeling well. Pt wife reports that he doesn't eat a lot and she has to remind him to eat, pt does admit after he eats he does feel better.

## 2023-09-01 ENCOUNTER — Ambulatory Visit (HOSPITAL_BASED_OUTPATIENT_CLINIC_OR_DEPARTMENT_OTHER): Admitting: Family Medicine

## 2023-10-08 ENCOUNTER — Ambulatory Visit (INDEPENDENT_AMBULATORY_CARE_PROVIDER_SITE_OTHER): Payer: Self-pay

## 2023-10-08 DIAGNOSIS — I255 Ischemic cardiomyopathy: Secondary | ICD-10-CM

## 2023-10-12 LAB — CUP PACEART REMOTE DEVICE CHECK
Battery Remaining Longevity: 113 mo
Battery Voltage: 3.01 V
Brady Statistic RV Percent Paced: 0 %
Date Time Interrogation Session: 20250816133722
HighPow Impedance: 63 Ohm
Implantable Lead Connection Status: 753985
Implantable Lead Implant Date: 20130307
Implantable Lead Location: 753860
Implantable Lead Model: 6935
Implantable Pulse Generator Implant Date: 20220718
Lead Channel Impedance Value: 247 Ohm
Lead Channel Impedance Value: 361 Ohm
Lead Channel Pacing Threshold Amplitude: 0.75 V
Lead Channel Pacing Threshold Pulse Width: 0.4 ms
Lead Channel Sensing Intrinsic Amplitude: 10 mV
Lead Channel Sensing Intrinsic Amplitude: 10 mV
Lead Channel Setting Pacing Amplitude: 2 V
Lead Channel Setting Pacing Pulse Width: 0.4 ms
Lead Channel Setting Sensing Sensitivity: 0.3 mV
Zone Setting Status: 755011
Zone Setting Status: 755011

## 2023-10-14 ENCOUNTER — Ambulatory Visit: Payer: Self-pay | Admitting: Cardiology

## 2023-11-17 NOTE — Progress Notes (Signed)
Remote ICD Transmission.

## 2023-11-28 ENCOUNTER — Other Ambulatory Visit: Payer: Self-pay | Admitting: Cardiology

## 2024-01-07 ENCOUNTER — Ambulatory Visit (INDEPENDENT_AMBULATORY_CARE_PROVIDER_SITE_OTHER): Payer: Self-pay

## 2024-01-07 DIAGNOSIS — I255 Ischemic cardiomyopathy: Secondary | ICD-10-CM

## 2024-01-09 LAB — CUP PACEART REMOTE DEVICE CHECK
Battery Remaining Longevity: 109 mo
Battery Voltage: 3 V
Brady Statistic RV Percent Paced: 0.01 %
Date Time Interrogation Session: 20251114123529
HighPow Impedance: 66 Ohm
Implantable Lead Connection Status: 753985
Implantable Lead Implant Date: 20130307
Implantable Lead Location: 753860
Implantable Lead Model: 6935
Implantable Pulse Generator Implant Date: 20220718
Lead Channel Impedance Value: 247 Ohm
Lead Channel Impedance Value: 342 Ohm
Lead Channel Pacing Threshold Amplitude: 0.875 V
Lead Channel Pacing Threshold Pulse Width: 0.4 ms
Lead Channel Sensing Intrinsic Amplitude: 8.875 mV
Lead Channel Sensing Intrinsic Amplitude: 8.875 mV
Lead Channel Setting Pacing Amplitude: 2 V
Lead Channel Setting Pacing Pulse Width: 0.4 ms
Lead Channel Setting Sensing Sensitivity: 0.3 mV
Zone Setting Status: 755011
Zone Setting Status: 755011

## 2024-01-10 NOTE — Progress Notes (Signed)
 Remote ICD Transmission

## 2024-01-15 ENCOUNTER — Other Ambulatory Visit: Payer: Self-pay | Admitting: Cardiology

## 2024-01-15 DIAGNOSIS — I480 Paroxysmal atrial fibrillation: Secondary | ICD-10-CM

## 2024-01-17 ENCOUNTER — Telehealth: Payer: Self-pay | Admitting: Cardiology

## 2024-01-17 NOTE — Telephone Encounter (Signed)
 Pt last saw Dr Bernie 07/21/23, last labs 12/28/22 Creat 1.6, overdue for labwork.  Pt is due for 6 month follow-up with Dr Bernie 01/17/24, recall in Epic.  Msg sent to schedulers to contact pt with follow-up appt and labwork at that time.  Age 68, weight 57.4kg, based on weight <60kg, and last serum Creat>1.5, pt is not on appropriate dosage of Eliquis .  Pt needs to have BMP repeated.  Attempted to contact pt to schedule appt for labwork, LMOM TCB.

## 2024-01-17 NOTE — Telephone Encounter (Signed)
 Patient wants to update his insurance, he has Medicare but whenever I go to submit it, it says that the Subscriber ID number is invalid. I've verified 2x with the patient his ID number. Can someone help enter his insurance?

## 2024-01-17 NOTE — Telephone Encounter (Signed)
*  STAT* If patient is at the pharmacy, call can be transferred to refill team.   1. Which medications need to be refilled? (please list name of each medication and dose if known)   ELIQUIS  5 MG TABS tablet   2. Would you like to learn more about the convenience, safety, & potential cost savings by using the [facility]?   3. Are you open to using the [facility] (Type [facility]. ).  4. Which pharmacy/location (including street and city if local pharmacy) is medication to be sent to?  [id number] - [place], [place] - [address]   5. Do they need a 30 day or 90 day supply?   30 day  Patient stated he still has some medication.  Patient has appointment scheduled with Dr. Bernie on 12/10.

## 2024-01-18 NOTE — Telephone Encounter (Signed)
 Refill sent in on 11/25 per pharmacy renewal request.

## 2024-01-18 NOTE — Telephone Encounter (Signed)
 Eliquis  5mg  refill request received. Patient is 68 years old, weight-57.4kg, Crea-1.60 on 12/28/22 via [facility] from [place] , [place], and last seen by Dr. Bernie on 07/21/23 and pending appt on 02/02/24. Dose is appropriate based on dosing criteria. Will send in refill to requested pharmacy.    Ordered labs to be done when he has his appointment in December.

## 2024-01-19 ENCOUNTER — Ambulatory Visit: Payer: Self-pay | Admitting: Cardiology

## 2024-02-02 ENCOUNTER — Ambulatory Visit: Attending: Cardiology | Admitting: Cardiology

## 2024-02-02 ENCOUNTER — Encounter: Payer: Self-pay | Admitting: Cardiology

## 2024-02-02 VITALS — BP 128/76 | HR 86 | Ht 65.6 in | Wt 142.0 lb

## 2024-02-02 DIAGNOSIS — E7800 Pure hypercholesterolemia, unspecified: Secondary | ICD-10-CM | POA: Diagnosis not present

## 2024-02-02 DIAGNOSIS — R0609 Other forms of dyspnea: Secondary | ICD-10-CM

## 2024-02-02 DIAGNOSIS — I480 Paroxysmal atrial fibrillation: Secondary | ICD-10-CM

## 2024-02-02 DIAGNOSIS — I6930 Unspecified sequelae of cerebral infarction: Secondary | ICD-10-CM | POA: Diagnosis not present

## 2024-02-02 DIAGNOSIS — I10 Essential (primary) hypertension: Secondary | ICD-10-CM

## 2024-02-02 DIAGNOSIS — Z95810 Presence of automatic (implantable) cardiac defibrillator: Secondary | ICD-10-CM

## 2024-02-02 DIAGNOSIS — R110 Nausea: Secondary | ICD-10-CM | POA: Diagnosis not present

## 2024-02-02 DIAGNOSIS — I2510 Atherosclerotic heart disease of native coronary artery without angina pectoris: Secondary | ICD-10-CM | POA: Diagnosis not present

## 2024-02-02 NOTE — Progress Notes (Unsigned)
 Cardiology Office Note:    Date:  02/02/2024   ID:  [name], DOB 08/26/1955, MRN [id number]  PCP:  Street, Lonni HERO, MD  Cardiologist:  Lamar Fitch, MD    Referring MD: [address] Lonni HERO, *   No chief complaint on file. Doing fine  History of Present Illness:    [name] is a 68 y.o. male past medical history significant for coronary artery disease in 2005 C5 for from acute anterior myocardial infarction, it also for ventricular fibrillation arrest, stent to proximal LAD has been done, he end up having cardiomyopathy ejection fraction 30 to 35% but recently improved to 40, he is on guideline directed medical therapy, also have Medtronic ICD.  Comes today to months for follow-up he retired but he said sometimes he is bored.  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   CAD (coronary artery disease)    s/p anterior wall MI 2005 with VF arrest at tha ttime s/p stent of prox LAD, with repeat intervention at that time for ruptured plque in RCA which had cleared so significantly that PCI was no longer contemplated    CAD, NATIVE VESSEL 03/20/2008   Qualifier: Diagnosis of  By: Fernande, MD, CODY Elspeth Darner    Chronic systolic congestive heart failure (HCC) 04/14/2017   Coronary artery disease involving native coronary artery of native heart without angina pectoris 11/05/2014   s/p anterior wall MI 2005 with VF arrest at tha ttime s/p stent of prox LAD, with repeat intervention at that time for ruptured plque in RCA which had cleared so significantly that PCI was no longer contemplated  Formatting of this note might be different from the original. Status post anterior wall myocardial infarction stent to LAD in 2005   Dyslipidemia 04/14/2017   H/O heart artery stent 04/14/2017   High cholesterol    Hypertension     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   Qualifier: Diagnosis of   By: Fernande, MD, CODY Elspeth Darner        Implantable cardioverter-defibrillator (ICD) in situ 03/20/2008   Qualifier: Diagnosis of  By: Fernande, MD, CODY Elspeth Darner    Ischemic cardiomyopathy    EF 30-35% s/p ICD placement 2005. 6949 lead malfunction/device ERI s/p new lead & generator change (Medtronic) 04/2011.   Late effect of cerebrovascular accident (CVA) 09/01/2018   Old MI (myocardial infarction) 04/14/2017   Paroxysmal atrial fibrillation (HCC) 12/12/2021    Past Surgical History:  Procedure Laterality Date   APPENDECTOMY  ~ 1974   CORONARY ANGIOPLASTY WITH STENT PLACEMENT  2005   ICD GENERATOR CHANGEOUT N/A 09/09/2020   Procedure: ICD GENERATOR CHANGEOUT;  Surgeon: Inocencio Soyla Lunger, MD;  Location: [facility] INVASIVE CV LAB;  Service: Cardiovascular;  Laterality: N/A;   ICD placement  2005   ICD replaced  04/30/11   ICD lead replaced; old ICD removed; new ICD placed   LEAD REVISION N/A 04/30/2011   Procedure: LEAD REVISION;  Surgeon: Danelle LELON Birmingham, MD;  Location: [facility] CATH LAB;  Service: Cardiovascular;  Laterality: N/A;   NASAL SEPTUM SURGERY  1988    Current Medications: Current Meds  Medication Sig   albuterol  (VENTOLIN  HFA) 108 (90 Base) MCG/ACT inhaler Inhale 2 puffs into the lungs every 4 (four) hours as needed for wheezing or shortness of breath.   apixaban  (  ELIQUIS ) 5 MG TABS tablet TAKE 1 TABLET BY MOUTH TWICE A DAY   atorvastatin  (LIPITOR) 40 MG tablet TAKE 1 TABLET BY MOUTH EVERY DAY   carvedilol  (COREG ) 6.25 MG tablet TAKE 1 TABLET BY MOUTH TWICE A DAY   clopidogrel  (PLAVIX ) 75 MG tablet TAKE 1 TABLET BY MOUTH EVERY DAY   Coenzyme Q10 (COQ10) 100 MG CAPS Take 100 mg by mouth daily.   ezetimibe  (ZETIA ) 10 MG tablet TAKE 1 TABLET BY MOUTH EVERY DAY   famotidine (PEPCID) 40 MG tablet Take 40 mg by mouth as needed for heartburn or indigestion.   furosemide  (LASIX ) 20 MG  tablet Take 1 tablet (20 mg total) by mouth as needed for edema.   Magnesium 400 MG TABS Take 400 mg by mouth daily at 6 (six) AM.   Melatonin 3 MG TBDP Take 3 mg by mouth at bedtime as needed (sleep).   nitroGLYCERIN  (NITROSTAT ) 0.4 MG SL tablet Place 1 tablet (0.4 mg total) under the tongue every 5 (five) minutes as needed for chest pain (Do not exceed 3 doses, if chest pain not resolved please call 911).   Omega-3 Fatty Acids (FISH OIL PO) Take 1 capsule by mouth daily. Unknown strenght   potassium chloride  SA (KLOR-CON  M) 20 MEQ tablet Take 1 tablet (20 mEq total) by mouth daily.   sacubitril -valsartan  (ENTRESTO ) 24-26 MG TAKE 1 TABLET BY MOUTH TWICE A DAY     Allergies:   Spironolactone, Sulfasalazine, Sulfonamide derivatives, and Codeine   Social History   Socioeconomic History   Marital status: Married    Spouse name: Not on file   Number of children: Not on file   Years of education: Not on file   Highest education level: Not on file  Occupational History   Not on file  Tobacco Use   Smoking status: Former    Current packs/day: 0.00    Average packs/day: 0.3 packs/day for 30.0 years (7.5 ttl pk-yrs)    Types: Cigarettes    Start date: 09/24/1989    Quit date: 09/25/2019    Years since quitting: 4.3   Smokeless tobacco: Never   Tobacco comments:    currently wearing a patch  Substance and Sexual Activity   Alcohol use: Yes    Comment: Rare   Drug use: No   Sexual activity: Yes  Other Topics Concern   Not on file  Social History Narrative   Not on file   Social Drivers of Health   Financial Resource Strain: Not on file  Food Insecurity: Not on file  Transportation Needs: Not on file  Physical Activity: Not on file  Stress: Not on file  Social Connections: Not on file     Family History: The patient's family history includes Heart attack in his father. ROS:   Please see the history of present illness.    All 14 point review of systems negative except as  described per history of present illness  EKGs/Labs/Other Studies Reviewed:         Recent Labs: No results found for requested labs within last 365 days.  Recent Lipid Panel    Component Value Date/Time   CHOL 126 06/22/2022 0914   TRIG 105 06/22/2022 0914   HDL 30 (L) 06/22/2022 0914   CHOLHDL 4.2 06/22/2022 0914   LDLCALC 76 06/22/2022 0914   LDLDIRECT 64 12/12/2021 1119    Physical Exam:    VS:  BP 128/76   Pulse 86   Ht 5' 5.6 (1.666 m)  Wt 142 lb (64.4 kg)   SpO2 98%   BMI 23.20 kg/m     Wt Readings from Last 3 Encounters:  02/02/24 142 lb (64.4 kg)  07/21/23 126 lb 9.6 oz (57.4 kg)  06/14/23 123 lb 12.8 oz (56.2 kg)     GEN:  Well nourished, well developed in no acute distress HEENT: Normal NECK: No JVD; No carotid bruits LYMPHATICS: No lymphadenopathy CARDIAC: RRR, no murmurs, no rubs, no gallops RESPIRATORY:  Clear to auscultation without rales, wheezing or rhonchi  ABDOMEN: Soft, non-tender, non-distended MUSCULOSKELETAL:  No edema; No deformity  SKIN: Warm and dry LOWER EXTREMITIES: no swelling NEUROLOGIC:  Alert and oriented x 3 PSYCHIATRIC:  Normal affect   ASSESSMENT:    1. Coronary artery disease involving native coronary artery of native heart without angina pectoris   2. Paroxysmal atrial fibrillation (HCC)   3. ICD (implantable cardioverter-defibrillator) in place   4. High cholesterol   5. Late effect of cerebrovascular accident (CVA)    PLAN:    In order of problems listed above:  Coronary disease stable from that point review guideline-directed medical therapy which I continue. Paroxysmal atrial fibrillation denies having palpitation continue anticoagulation dose appropriately his weight kidney function and age. ICD present normal function.  Dyslipidemia will check his fasting lipid profile.  Late effect of CVA no new issues   Medication Adjustments/Labs and Tests Ordered: Current medicines are reviewed at length with the  patient today.  Concerns regarding medicines are outlined above.  No orders of the defined types were placed in this encounter.  Medication changes: No orders of the defined types were placed in this encounter.   Signed, Lamar DOROTHA Fitch, MD, [facility] 02/02/2024 10:57 AM    [facility]

## 2024-02-02 NOTE — Patient Instructions (Signed)
 Medication Instructions:  Your physician recommends that you continue on your current medications as directed. Please refer to the Current Medication list given to you today.  *If you need a refill on your cardiac medications before your next appointment, please call your pharmacy*   Lab Work: Your physician recommends that you return for lab work in: when fasting You need to have labs done when you are fasting.  You can come Monday through Friday 8:30 am to 12:00 pm and 1:15 to 4:30. You do not need to make an appointment as the order has already been placed.     Testing/Procedures: Your physician has requested that you have an echocardiogram. Echocardiography is a painless test that uses sound waves to create images of your heart. It provides your doctor with information about the size and shape of your heart and how well your hearts chambers and valves are working. This procedure takes approximately one hour. There are no restrictions for this procedure. Please do NOT wear cologne, perfume, aftershave, or lotions (deodorant is allowed). Please arrive 15 minutes prior to your appointment time.  Please note: We ask at that you not bring children with you during ultrasound (echo/ vascular) testing. Due to room size and safety concerns, children are not allowed in the ultrasound rooms during exams. Our front office staff cannot provide observation of children in our lobby area while testing is being conducted. An adult accompanying a patient to their appointment will only be allowed in the ultrasound room at the discretion of the ultrasound technician under special circumstances. We apologize for any inconvenience.    Follow-Up: At [facility], you and your health needs are our priority.  As part of our continuing mission to provide you with exceptional heart care, we have created designated Provider Care Teams.  These Care Teams include your primary Cardiologist (physician) and Advanced  Practice Providers (APPs -  Physician Assistants and Nurse Practitioners) who all work together to provide you with the care you need, when you need it.  We recommend signing up for the patient portal called MyChart.  Sign up information is provided on this After Visit Summary.  MyChart is used to connect with patients for Virtual Visits (Telemedicine).  Patients are able to view lab/test results, encounter notes, upcoming appointments, etc.  Non-urgent messages can be sent to your provider as well.   To learn more about what you can do with MyChart, go to [email or web address].    Your next appointment:   6 month(s)  The format for your next appointment:   In Person  Provider:   Lamar Fitch, MD    Other Instructions Referral to GI- They will call for appt

## 2024-02-05 LAB — COMPREHENSIVE METABOLIC PANEL WITH GFR
ALT: 81 IU/L — ABNORMAL HIGH (ref 0–44)
AST: 90 IU/L — ABNORMAL HIGH (ref 0–40)
Albumin: 4.3 g/dL (ref 3.9–4.9)
Alkaline Phosphatase: 63 IU/L (ref 47–123)
BUN/Creatinine Ratio: 13 (ref 10–24)
BUN: 20 mg/dL (ref 8–27)
Bilirubin Total: 0.4 mg/dL (ref 0.0–1.2)
CO2: 23 mmol/L (ref 20–29)
Calcium: 9.7 mg/dL (ref 8.6–10.2)
Chloride: 104 mmol/L (ref 96–106)
Creatinine, Ser: 1.49 mg/dL — ABNORMAL HIGH (ref 0.76–1.27)
Globulin, Total: 3.4 g/dL (ref 1.5–4.5)
Glucose: 98 mg/dL (ref 70–99)
Potassium: 5 mmol/L (ref 3.5–5.2)
Sodium: 141 mmol/L (ref 134–144)
Total Protein: 7.7 g/dL (ref 6.0–8.5)
eGFR: 51 mL/min/1.73 — ABNORMAL LOW (ref >59)

## 2024-02-05 LAB — LIPID PANEL
Chol/HDL Ratio: 4.4 ratio (ref 0.0–5.0)
Cholesterol, Total: 114 mg/dL (ref 100–199)
HDL: 26 mg/dL — ABNORMAL LOW (ref >39)
LDL Chol Calc (NIH): 61 mg/dL (ref 0–99)
Triglycerides: 152 mg/dL — ABNORMAL HIGH (ref 0–149)
VLDL Cholesterol Cal: 27 mg/dL (ref 5–40)

## 2024-02-07 ENCOUNTER — Telehealth: Payer: Self-pay

## 2024-02-07 ENCOUNTER — Ambulatory Visit: Payer: Self-pay | Admitting: Cardiology

## 2024-02-07 NOTE — Telephone Encounter (Signed)
 Left message on My Chart with lab results per Dr. Karry note. Routed to PCP

## 2024-02-24 ENCOUNTER — Other Ambulatory Visit: Payer: Self-pay | Admitting: Cardiology

## 2024-03-02 ENCOUNTER — Ambulatory Visit: Attending: Cardiology

## 2024-03-02 DIAGNOSIS — R0609 Other forms of dyspnea: Secondary | ICD-10-CM | POA: Diagnosis not present

## 2024-03-02 MED ORDER — PERFLUTREN LIPID MICROSPHERE
1.0000 mL | INTRAVENOUS | Status: AC | PRN
  Administered 2024-03-02: 10 mL via INTRAVENOUS

## 2024-03-03 LAB — ECHOCARDIOGRAM COMPLETE
Area-P 1/2: 3.42 cm2
Est EF: 40
S' Lateral: 3.7 cm

## 2024-03-07 ENCOUNTER — Telehealth: Payer: Self-pay

## 2024-03-07 NOTE — Telephone Encounter (Signed)
 Pt viewed Echo results on My Chart per Dr. Vanetta Shawl note. Routed to PCP.

## 2024-03-07 NOTE — Telephone Encounter (Signed)
 Left message on My Chart with Echo results per Dr. Karry note. Routed to PCP.

## 2024-03-18 ENCOUNTER — Other Ambulatory Visit: Payer: Self-pay | Admitting: Cardiology

## 2024-04-07 ENCOUNTER — Ambulatory Visit: Payer: Self-pay

## 2024-07-07 ENCOUNTER — Ambulatory Visit: Payer: Self-pay
